# Patient Record
Sex: Male | Born: 1975 | Race: Black or African American | Hispanic: No | Marital: Single | Smoking: Never smoker
Health system: Southern US, Community
[De-identification: ages and names within clinical notes are randomized; demographics above are authoritative.]

## PROBLEM LIST (undated history)

## (undated) DIAGNOSIS — I509 Heart failure, unspecified: Secondary | ICD-10-CM

---

## 1998-08-09 ENCOUNTER — Emergency Department (HOSPITAL_COMMUNITY): Admission: EM | Admit: 1998-08-09 | Discharge: 1998-08-09 | Payer: Self-pay | Admitting: Emergency Medicine

## 2001-03-14 ENCOUNTER — Emergency Department (HOSPITAL_COMMUNITY): Admission: EM | Admit: 2001-03-14 | Discharge: 2001-03-14 | Payer: Self-pay | Admitting: Emergency Medicine

## 2001-03-14 ENCOUNTER — Encounter: Payer: Self-pay | Admitting: Emergency Medicine

## 2003-05-20 ENCOUNTER — Emergency Department (HOSPITAL_COMMUNITY): Admission: EM | Admit: 2003-05-20 | Discharge: 2003-05-21 | Payer: Self-pay | Admitting: Emergency Medicine

## 2003-05-21 ENCOUNTER — Encounter: Payer: Self-pay | Admitting: Emergency Medicine

## 2006-09-29 ENCOUNTER — Emergency Department (HOSPITAL_COMMUNITY): Admission: EM | Admit: 2006-09-29 | Discharge: 2006-09-30 | Payer: Self-pay | Admitting: Emergency Medicine

## 2016-06-17 ENCOUNTER — Emergency Department (HOSPITAL_COMMUNITY): Payer: Medicaid Other

## 2016-06-17 ENCOUNTER — Encounter (HOSPITAL_COMMUNITY): Payer: Self-pay | Admitting: Emergency Medicine

## 2016-06-17 ENCOUNTER — Inpatient Hospital Stay (HOSPITAL_COMMUNITY): Payer: Medicaid Other

## 2016-06-17 ENCOUNTER — Inpatient Hospital Stay (HOSPITAL_COMMUNITY)
Admission: EM | Admit: 2016-06-17 | Discharge: 2016-06-27 | DRG: 176 | Disposition: A | Discharge status: Home / Self Care | Payer: Medicaid Other | Attending: Internal Medicine | Admitting: Internal Medicine

## 2016-06-17 DIAGNOSIS — Z7901 Long term (current) use of anticoagulants: Secondary | ICD-10-CM | POA: Diagnosis not present

## 2016-06-17 DIAGNOSIS — I110 Hypertensive heart disease with heart failure: Secondary | ICD-10-CM | POA: Diagnosis present

## 2016-06-17 DIAGNOSIS — I420 Dilated cardiomyopathy: Secondary | ICD-10-CM | POA: Diagnosis present

## 2016-06-17 DIAGNOSIS — I493 Ventricular premature depolarization: Secondary | ICD-10-CM

## 2016-06-17 DIAGNOSIS — R7989 Other specified abnormal findings of blood chemistry: Secondary | ICD-10-CM | POA: Diagnosis present

## 2016-06-17 DIAGNOSIS — I5082 Biventricular heart failure: Secondary | ICD-10-CM | POA: Diagnosis present

## 2016-06-17 DIAGNOSIS — I959 Hypotension, unspecified: Secondary | ICD-10-CM | POA: Diagnosis not present

## 2016-06-17 DIAGNOSIS — I429 Cardiomyopathy, unspecified: Secondary | ICD-10-CM

## 2016-06-17 DIAGNOSIS — Z8701 Personal history of pneumonia (recurrent): Secondary | ICD-10-CM

## 2016-06-17 DIAGNOSIS — K5900 Constipation, unspecified: Secondary | ICD-10-CM | POA: Diagnosis present

## 2016-06-17 DIAGNOSIS — E875 Hyperkalemia: Secondary | ICD-10-CM | POA: Diagnosis present

## 2016-06-17 DIAGNOSIS — D6851 Activated protein C resistance: Secondary | ICD-10-CM | POA: Diagnosis present

## 2016-06-17 DIAGNOSIS — I2699 Other pulmonary embolism without acute cor pulmonale: Secondary | ICD-10-CM | POA: Diagnosis present

## 2016-06-17 DIAGNOSIS — I240 Acute coronary thrombosis not resulting in myocardial infarction: Secondary | ICD-10-CM

## 2016-06-17 DIAGNOSIS — I472 Ventricular tachycardia: Secondary | ICD-10-CM | POA: Diagnosis present

## 2016-06-17 DIAGNOSIS — K219 Gastro-esophageal reflux disease without esophagitis: Secondary | ICD-10-CM | POA: Diagnosis present

## 2016-06-17 DIAGNOSIS — R042 Hemoptysis: Secondary | ICD-10-CM | POA: Diagnosis present

## 2016-06-17 DIAGNOSIS — K761 Chronic passive congestion of liver: Secondary | ICD-10-CM | POA: Diagnosis present

## 2016-06-17 DIAGNOSIS — I824Z1 Acute embolism and thrombosis of unspecified deep veins of right distal lower extremity: Secondary | ICD-10-CM | POA: Diagnosis present

## 2016-06-17 DIAGNOSIS — R778 Other specified abnormalities of plasma proteins: Secondary | ICD-10-CM

## 2016-06-17 DIAGNOSIS — I248 Other forms of acute ischemic heart disease: Secondary | ICD-10-CM | POA: Diagnosis present

## 2016-06-17 DIAGNOSIS — R945 Abnormal results of liver function studies: Secondary | ICD-10-CM

## 2016-06-17 DIAGNOSIS — Z87891 Personal history of nicotine dependence: Secondary | ICD-10-CM | POA: Diagnosis not present

## 2016-06-17 DIAGNOSIS — I4729 Other ventricular tachycardia: Secondary | ICD-10-CM

## 2016-06-17 LAB — CBC
HCT: 46 % (ref 39.0–52.0)
Hemoglobin: 15.9 g/dL (ref 13.0–17.0)
MCH: 29.6 pg (ref 26.0–34.0)
MCHC: 34.6 g/dL (ref 30.0–36.0)
MCV: 85.5 fL (ref 78.0–100.0)
Platelets: 187 10*3/uL (ref 150–400)
RBC: 5.38 MIL/uL (ref 4.22–5.81)
RDW: 15.3 % (ref 11.5–15.5)
WBC: 8.5 10*3/uL (ref 4.0–10.5)

## 2016-06-17 LAB — ECHOCARDIOGRAM COMPLETE
Height: 79 in
Weight: 3360 oz

## 2016-06-17 LAB — D-DIMER, QUANTITATIVE (NOT AT ARMC): D DIMER QUANT: 15.83 ug{FEU}/mL — AB (ref 0.00–0.50)

## 2016-06-17 LAB — HEPATIC FUNCTION PANEL
ALK PHOS: 115 U/L (ref 38–126)
ALT: 228 U/L — AB (ref 17–63)
AST: 87 U/L — AB (ref 15–41)
Albumin: 3.1 g/dL — ABNORMAL LOW (ref 3.5–5.0)
Bilirubin, Direct: 0.7 mg/dL — ABNORMAL HIGH (ref 0.1–0.5)
Indirect Bilirubin: 2.1 mg/dL — ABNORMAL HIGH (ref 0.3–0.9)
TOTAL PROTEIN: 6.4 g/dL — AB (ref 6.5–8.1)
Total Bilirubin: 2.8 mg/dL — ABNORMAL HIGH (ref 0.3–1.2)

## 2016-06-17 LAB — BASIC METABOLIC PANEL
Anion gap: 9 (ref 5–15)
BUN: 13 mg/dL (ref 6–20)
CALCIUM: 8.2 mg/dL — AB (ref 8.9–10.3)
CO2: 20 mmol/L — AB (ref 22–32)
CREATININE: 0.95 mg/dL (ref 0.61–1.24)
Chloride: 105 mmol/L (ref 101–111)
Glucose, Bld: 179 mg/dL — ABNORMAL HIGH (ref 65–99)
Potassium: 4.3 mmol/L (ref 3.5–5.1)
SODIUM: 134 mmol/L — AB (ref 135–145)

## 2016-06-17 LAB — PROTIME-INR
INR: 1.79
Prothrombin Time: 21 seconds — ABNORMAL HIGH (ref 11.4–15.2)

## 2016-06-17 LAB — TROPONIN I: TROPONIN I: 0.04 ng/mL — AB (ref <0.03)

## 2016-06-17 LAB — MRSA PCR SCREENING: MRSA by PCR: NEGATIVE

## 2016-06-17 LAB — APTT: APTT: 37 s — AB (ref 24–36)

## 2016-06-17 LAB — I-STAT TROPONIN, ED: Troponin i, poc: 0.05 ng/mL (ref 0.00–0.08)

## 2016-06-17 LAB — HEPARIN LEVEL (UNFRACTIONATED)
HEPARIN UNFRACTIONATED: 0.33 [IU]/mL (ref 0.30–0.70)
Heparin Unfractionated: 0.22 IU/mL — ABNORMAL LOW (ref 0.30–0.70)

## 2016-06-17 LAB — ANTITHROMBIN III: ANTITHROMB III FUNC: 59 % — AB (ref 75–120)

## 2016-06-17 MED ORDER — SODIUM CHLORIDE 0.9% FLUSH
3.0000 mL | Freq: Two times a day (BID) | INTRAVENOUS | Status: DC
  Administered 2016-06-17 – 2016-06-27 (×15): 3 mL via INTRAVENOUS

## 2016-06-17 MED ORDER — MORPHINE SULFATE (PF) 2 MG/ML IV SOLN
1.0000 mg | INTRAVENOUS | Status: DC | PRN
  Administered 2016-06-17 – 2016-06-18 (×3): 2 mg via INTRAVENOUS
  Filled 2016-06-17 (×3): qty 1

## 2016-06-17 MED ORDER — IOPAMIDOL (ISOVUE-370) INJECTION 76%
100.0000 mL | Freq: Once | INTRAVENOUS | Status: AC | PRN
  Administered 2016-06-17: 100 mL via INTRAVENOUS

## 2016-06-17 MED ORDER — IOPAMIDOL (ISOVUE-370) INJECTION 76%
INTRAVENOUS | Status: AC
  Filled 2016-06-17: qty 100

## 2016-06-17 MED ORDER — HEPARIN (PORCINE) IN NACL 100-0.45 UNIT/ML-% IJ SOLN
2350.0000 [IU]/h | INTRAMUSCULAR | Status: DC
  Administered 2016-06-17 – 2016-06-18 (×3): 1900 [IU]/h via INTRAVENOUS
  Administered 2016-06-19: 2350 [IU]/h via INTRAVENOUS
  Administered 2016-06-19: 2200 [IU]/h via INTRAVENOUS
  Administered 2016-06-20 – 2016-06-27 (×15): 2350 [IU]/h via INTRAVENOUS
  Filled 2016-06-17 (×21): qty 250

## 2016-06-17 MED ORDER — ONDANSETRON HCL 4 MG/2ML IJ SOLN
4.0000 mg | Freq: Once | INTRAMUSCULAR | Status: AC
  Administered 2016-06-17: 4 mg via INTRAVENOUS
  Filled 2016-06-17: qty 2

## 2016-06-17 MED ORDER — HYDROMORPHONE HCL 1 MG/ML IJ SOLN
1.0000 mg | Freq: Once | INTRAMUSCULAR | Status: AC
  Administered 2016-06-17: 1 mg via INTRAVENOUS
  Filled 2016-06-17: qty 1

## 2016-06-17 MED ORDER — HEPARIN (PORCINE) IN NACL 100-0.45 UNIT/ML-% IJ SOLN
1500.0000 [IU]/h | INTRAMUSCULAR | Status: DC
  Administered 2016-06-17: 1500 [IU]/h via INTRAVENOUS
  Filled 2016-06-17 (×2): qty 250

## 2016-06-17 NOTE — H&P (Signed)
History and Physical    Richard [name] FOY:[id number] DOB: [birth year] DOA: 06/17/2016   PCP: No primary care provider on file. Chief Complaint:  Chief Complaint  Patient presents with  . Shortness of Breath  . Chest Pain    HPI: Richard [name] is a 40 y.o. male with medical history significant of previously healthy.  Patient presents to the ED with c/o SOB and chest pain.  Symptoms have been ongoing for about the past 1 month.  Became significantly worse yesterday and last night he felt like he couldn't breath.  This prompted him to come in to the ED.  Nothing makes symptoms better or worse, has tried nothing for symptoms.  Patient has no family history of clotting disorders, did travel to [place] about 1 month ago.  No other recent travel or prolonged sitting.  ED Course: Multifocal PEs, RV strain, pulmonary infarcts.  Review of Systems: As per HPI otherwise 10 point review of systems negative.    History reviewed. No pertinent past medical history.  History reviewed. No pertinent surgical history.   reports that he has never smoked. He has never used smokeless tobacco. He reports that he does not drink alcohol or use drugs.  No Known Allergies  History reviewed. No pertinent family history.   Prior to Admission medications   Not on File    Physical Exam: Vitals:   06/17/16 0454 06/17/16 0500 06/17/16 0530 06/17/16 0600  BP:  108/88 117/85 90/69  Pulse:  102 (!) 48 (!) 33  Resp:   23 24  Temp:      TempSrc:      SpO2: (S) 94% 100% 97% 98%  Weight:      Height:          Constitutional: NAD, calm, comfortable Eyes: PERRL, lids and conjunctivae normal ENMT: Mucous membranes are moist. Posterior pharynx clear of any exudate or lesions.Normal dentition.  Neck: normal, supple, no masses, no thyromegaly Respiratory: clear to auscultation bilaterally, no wheezing, no crackles. Normal respiratory effort. No accessory muscle use.  Cardiovascular: Regular rate and  rhythm, no murmurs / rubs / gallops. No extremity edema. 2+ pedal pulses. No carotid bruits.  Abdomen: no tenderness, no masses palpated. No hepatosplenomegaly. Bowel sounds positive.  Musculoskeletal: no clubbing / cyanosis. No joint deformity upper and lower extremities. Good ROM, no contractures. Normal muscle tone.  Skin: no rashes, lesions, ulcers. No induration Neurologic: CN 2-12 grossly intact. Sensation intact, DTR normal. Strength 5/5 in all 4.  Psychiatric: Normal judgment and insight. Alert and oriented x 3. Normal mood.    Labs on Admission: I have personally reviewed following labs and imaging studies  CBC:  Recent Labs Lab 06/17/16 0401  WBC 8.5  HGB 15.9  HCT 46.0  MCV 85.5  PLT 187   Basic Metabolic Panel:  Recent Labs Lab 06/17/16 0401  NA 134*  K 4.3  CL 105  CO2 20*  GLUCOSE 179*  BUN 13  CREATININE 0.95  CALCIUM 8.2*   GFR: Estimated Creatinine Clearance: 138.4 mL/min (by C-G formula based on SCr of 0.95 mg/dL). Liver Function Tests: No results for input(s): AST, ALT, ALKPHOS, BILITOT, PROT, ALBUMIN in the last 168 hours. No results for input(s): LIPASE, AMYLASE in the last 168 hours. No results for input(s): AMMONIA in the last 168 hours. Coagulation Profile:  Recent Labs Lab 06/17/16 0546  INR 1.79   Cardiac Enzymes: No results for input(s): CKTOTAL, CKMB, CKMBINDEX, TROPONINI in the last 168 hours. BNP (last 3 results) No  results for input(s): PROBNP in the last 8760 hours. HbA1C: No results for input(s): HGBA1C in the last 72 hours. CBG: No results for input(s): GLUCAP in the last 168 hours. Lipid Profile: No results for input(s): CHOL, HDL, LDLCALC, TRIG, CHOLHDL, LDLDIRECT in the last 72 hours. Thyroid Function Tests: No results for input(s): TSH, T4TOTAL, FREET4, T3FREE, THYROIDAB in the last 72 hours. Anemia Panel: No results for input(s): VITAMINB12, FOLATE, FERRITIN, TIBC, IRON, RETICCTPCT in the last 72 hours. Urine  analysis: No results found for: COLORURINE, APPEARANCEUR, LABSPEC, PHURINE, GLUCOSEU, HGBUR, BILIRUBINUR, KETONESUR, PROTEINUR, UROBILINOGEN, NITRITE, LEUKOCYTESUR Sepsis Labs: @LABRCNTIP (procalcitonin:4,lacticidven:4) )No results found for this or any previous visit (from the past 240 hour(s)).   Radiological Exams on Admission: Dg Chest 2 View  Result Date: 06/17/2016 CLINICAL DATA:  Chest pain, hemoptysis and vomiting EXAM: CHEST  2 VIEW COMPARISON:  Chest radiograph 05/13/2016 FINDINGS: Massive enlargement of the cardiomediastinal silhouette is unchanged. No pleural effusion or pneumothorax. There is right middle lobe atelectasis. The left lung is clear. No pulmonary edema. IMPRESSION: 1. Right middle lobe collapse. Superimposed infection would be difficult to exclude. 2. Unchanged massive cardiomegaly versus pericardial effusion. Electronically Signed   By: Deatra Robinson M.D.   On: 06/17/2016 04:20   Ct Angio Chest Pe W And/or Wo Contrast  Result Date: 06/17/2016 CLINICAL DATA:  Right chest pain radiating to the back. Pneumonia last month. Symptoms have not responded to antibiotics. Blood in sputum. Shortness of breath, tachycardia, hypoxia. EXAM: CT ANGIOGRAPHY CHEST WITH CONTRAST TECHNIQUE: Multidetector CT imaging of the chest was performed using the standard protocol during bolus administration of intravenous contrast. Multiplanar CT image reconstructions and MIPs were obtained to evaluate the vascular anatomy. CONTRAST:  100 mL Isovue 370 COMPARISON:  None. FINDINGS: Cardiovascular: Filling defects in the right lower lobe and left lingular pulmonary arteries consistent with pulmonary emboli. Cardiac enlargement. RV to LV ratio is upper limits of normal at 0.88. There is reflux of contrast material into the hepatic veins. Changes may indicate evidence of right heart strain. No aortic aneurysm. No pericardial effusion. Mediastinum/Nodes: Enlarged lymph nodes demonstrated throughout the  mediastinum. Largest node is in the aortopulmonic window, measuring 15 mm short axis dimension. Nodes are likely reactive. Esophagus is decompressed. Lungs/Pleura: Focal areas of airspace disease demonstrated in the left lingula, left lower lung, and right lower lung. These changes may represent multifocal pneumonia or infarcts. Small left pleural effusion. No pneumothorax. Upper Abdomen: No acute abnormality. Musculoskeletal: Degenerative changes in the spine. No destructive bone lesions. Review of the MIP images confirms the above findings. IMPRESSION: Positive for multiple bilateral pulmonary emboli involving segmental and subsegmental branches. Borderline RV to LV ratio of 0.88 and reflux of contrast material into the hepatic veins suggests possibility of right heart strain. Focal areas of airspace disease in the right lower lobe and left upper lung may indicate pulmonary infarcts or multifocal pneumonia. Small left pleural effusion. These results were called by telephone at the time of interpretation on 06/17/2016 at 5:28 am to PA [facility] , who verbally acknowledged these results. Electronically Signed   By: Burman Nieves M.D.   On: 06/17/2016 05:35    EKG: Independently reviewed.  Assessment/Plan Active Problems:   Pulmonary embolism and infarction (HCC)    1. Pulmonary embolism and infarction with RV strain - 1. Heparin gtt 2. Tele monitor 3. 2d echo 4. hypercoag work up ordered and was drawn right before he was put on heparin gtt 2. Elevated INR on initial labs -  1. Unclear why INR 1.79 2. LFTs ordered   DVT prophylaxis: Heparin gtt Code Status: Full Family Communication: Wife at bedside Consults called: None Admission status: Admit to inpatient   Hillary BowGARDNER, Paidyn Mcferran M. DO [facility] Pager [phone] from 7PM-7AM  If 7AM-7PM, please contact the day physician for the patient [email or web address] Password [email or web address]  06/17/2016, 6:21 AM

## 2016-06-17 NOTE — Progress Notes (Signed)
ANTICOAGULATION CONSULT NOTE - Initial Consult  Pharmacy Consult for Heparin Indication: Multiple bilateral Pulmonary Embolism with possible right heart strain  No Known Allergies  Patient Measurements: Height: 6\' 7"  (200.7 cm) Weight: 210 lb (95.3 kg) IBW/kg (Calculated) : 93.7 Heparin Dosing Weight: actual body weight  Vital Signs: Temp: 97.3 F (36.3 C) (11/20 1200) Temp Source: Oral (11/20 1200) BP: 118/94 (11/20 1200) Pulse Rate: 96 (11/20 1200)  Labs:  Recent Labs  06/17/16 0401 06/17/16 0546 06/17/16 1223  HGB 15.9  --   --   HCT 46.0  --   --   PLT 187  --   --   APTT  --  37*  --   LABPROT  --  21.0*  --   INR  --  1.79  --   HEPARINUNFRC  --   --  0.22*  CREATININE 0.95  --   --     Estimated Creatinine Clearance: 138.4 mL/min (by C-G formula based on SCr of 0.95 mg/dL).   Medical History: History reviewed. No pertinent past medical history.  Medications:  No meds PTA  Assessment: 40 yr male with no significant PMH presents with complaint of shortness of breath and chest pain.  Recently completed course of Zpack with no resolution of symptoms.  Reports hemoptysis which has been worsening. Pharmacy to dose heparin for PE  Significant events:  11/19: CTAngio shows bilateral pulmonary embolism with possible right heart strain  Today, 06/17/2016:  CBC: wnl  Most recent heparin level SUBtherapeutic on 1500 units/hr  Patient endorses hemoptysis for ~ 1 month, but this has not worsened since initiating heparin  CrCl: > 90 ml/min  Goal of Therapy: Heparin level 0.3-0.7 units/ml Monitor platelets by anticoagulation protocol: Yes  Plan:  Increase heparin IV infusion to 1900 units/hr; may want to stay on lower end of range given hemoptysis  Check heparin level 6 hrs after rate change  Daily CBC, daily heparin level once stable  Monitor for signs of bleeding or thrombosis   Bernadene Person, PharmD Pager: [phone] 06/17/2016, 1:27  PM

## 2016-06-17 NOTE — Progress Notes (Signed)
PHARMACIST - PHYSICIAN COMMUNICATION CONCERNING:  IV heparin  40 yoM on IV heparin for bilateral PEs with possible right heart strain.  Please see note written by Bernadene Person, PharmD, earlier today for more details.    Heparin infusion currently at 1900 units/hr.  Heparin level tonight = 0.33, therapeutic but at low end of range.  Pt continues to have hemoptysis, no better or worse per patient than what he was experiencing at home.  No issues with infusion.   RECOMMENDATION: Continue heparin infusion at current rate and recheck in 6 hours.   Haynes Hoehn, PharmD, BCPS 06/17/2016, 8:50 PM  Pager: [phone]

## 2016-06-17 NOTE — Progress Notes (Signed)
Kirtland Bouchard Schorr paged about critical troponin 0.04 at 2135.

## 2016-06-17 NOTE — Progress Notes (Signed)
ANTICOAGULATION CONSULT NOTE - Initial Consult  Pharmacy Consult for Heparin Indication: Multiple bilateral Pulmonary Embolism with possible right heart strain  No Known Allergies  Patient Measurements: Height: 6\' 7"  (200.7 cm) Weight: 210 lb (95.3 kg) IBW/kg (Calculated) : 93.7 Heparin Dosing Weight: actual body weight  Vital Signs: Temp: 98.3 F (36.8 C) (11/20 0347) Temp Source: Oral (11/20 0347) BP: 117/85 (11/20 0530) Pulse Rate: 48 (11/20 0530)  Labs:  Recent Labs  06/17/16 0401  HGB 15.9  HCT 46.0  PLT 187  CREATININE 0.95    Estimated Creatinine Clearance: 138.4 mL/min (by C-G formula based on SCr of 0.95 mg/dL).   Medical History: History reviewed. No pertinent past medical history.  Medications:  No meds PTA  Assessment: 40 yr male with no significant PMH presents with complaint of shortness of breath and chest pain.  Recently completed course of Zpack with no resolution of symptoms.  Reports hemoptysis which has been worsening   CTAngio shows bilateral pulmonary embolism with possible right heart strain  Pharmacy consulted to dose IV heparin  Goal of Therapy:  Heparin level 0.3-0.7 units/ml Monitor platelets by anticoagulation protocol: Yes   Plan:   Obtain baseline aPTT and PT/INR per protocol  No heparin bolus due to hemoptysis  Begin IV heparin @ 1500 units/hr  Check heparin level 6 hr after heparin started  Follow heparin level & CBC daily   Jevante Hollibaugh, Joselyn Glassman, PharmD 06/17/2016,5:44 AM

## 2016-06-17 NOTE — ED Triage Notes (Signed)
Reports having right sided chest pain that started yesterday afternoon around 1500 with radiation into back. Pt also reports being dx with pneumonia last month and feels like it has not cleared up even after being on abx. Pt reported that he has had times of coughing up blood.

## 2016-06-17 NOTE — Care Management Note (Signed)
Case Management Note  Patient Details  Name: Richard [name] MRN: [id number] Date of Birth: 1975-09-15  Subjective/Objective:         Multiple pulmonary emboli           Action/Plan:  Lives at home Date:  June 17, 2016 Chart reviewed for concurrent status and case management needs. Will continue to follow patient progress. Discharge Planning: following for needs Expected discharge date: [id number] Marcelle Smiling, BSN, [place], [place]   [phone]   Expected Discharge Date:                  Expected Discharge Plan:  Home/Self Care  In-House Referral:     Discharge planning Services     Post Acute Care Choice:    Choice offered to:     DME Arranged:    DME Agency:     HH Arranged:    HH Agency:     Status of Service:  In process, will continue to follow  If discussed at Long Length of Stay Meetings, dates discussed:    Additional Comments:  Golda Acre, RN 06/17/2016, 8:48 AM

## 2016-06-17 NOTE — Progress Notes (Signed)
Hoss Palermo is a 40 y.o. male with no significant medical history, presents to the ED with c/o SOB and chest pain. He was found to have multifocal pulmonary embolism, RV strain and pulmonary infarcts. He was started on IV heparin and admitted to step down.  Hypercoagulable work up ordered and 2 D echo ordered.  It showed Large thrombus burding in LV apex and distal  septum The cavity size was severely dilated. Wall thickness was  normal. The estimated ejection fraction was 15% Diffuse hypokinesis. Doppler parameters are consistent with both elevated  ventricular end-diastolic filling pressure and elevated left atrial filling pressure. Cardiology consulted for recommendations.  Continue to monitor in step down.   Edson Snowball Kyshon Tolliver,MD [phone]

## 2016-06-17 NOTE — ED Provider Notes (Signed)
[facility]-EMERGENCY DEPT Provider Note   CSN: [id number] Arrival date & time: 06/17/16  0340    History   Chief Complaint Chief Complaint  Patient presents with  . Shortness of Breath  . Chest Pain    HPI Richard [name] is a 40 y.o. male.  40 year old male with no known past medical history presents to the emergency department for shortness of breath and right-sided chest pain. Patient states that his chest pain is worse with breathing. Patient notes significant worsening of this pain yesterday afternoon at 1500. Pain is sharp and will radiate to the back. He has had shortness of breath which is worse with exertion. Patient reporting hemoptysis which has been intermittent and worsening over the past few weeks. Patient was initially seen one month ago at urgent care and diagnosed with an upper respiratory infection. He was placed on a Z-Pak which he completed without significant resolution of his symptoms. Patient went [facility] on 05/17/2016 for persistent cough. He was discharged on doxycycline. Patient has completed this course, but has not noted any improvement to his cough or breathing difficulty. Patient denies any recent surgeries or hospitalizations. No history of blood clots. He has had no leg swelling. He is self-employed and denies frequent travel or long-haul driving. No recent fevers. Patient reports a history of smoking, but quit approximately 15 years ago.   The history is provided by the patient and the spouse. No language interpreter was used.  Shortness of Breath  Associated symptoms include cough, chest pain (right lower chest wall) and vomiting. Pertinent negatives include no fever.  Chest Pain   Associated symptoms include cough, nausea, shortness of breath and vomiting. Pertinent negatives include no fever.    History reviewed. No pertinent past medical history.  Patient Active Problem List   Diagnosis Date Noted  . Pulmonary embolism and infarction (HCC)  06/17/2016    History reviewed. No pertinent surgical history.     Home Medications    Prior to Admission medications   Not on File    Family History History reviewed. No pertinent family history.  Social History Social History  Substance Use Topics  . Smoking status: Never Smoker  . Smokeless tobacco: Never Used  . Alcohol use No     Allergies   Patient has no known allergies.   Review of Systems Review of Systems  Constitutional: Negative for fever.  Respiratory: Positive for cough, chest tightness and shortness of breath.        +hemoptysis +DOE  Cardiovascular: Positive for chest pain (right lower chest wall).  Gastrointestinal: Positive for nausea and vomiting.  Neurological: Negative for syncope.  Ten systems reviewed and are negative for acute change, except as noted in the HPI.    Physical Exam Updated Vital Signs BP 117/85   Pulse (!) 48   Temp 98.3 F (36.8 C) (Oral)   Resp 23   Ht 6\' 7"  (2.007 m)   Wt 95.3 kg   SpO2 97%   BMI 23.66 kg/m   Physical Exam  Constitutional: He is oriented to person, place, and time. He appears well-developed and well-nourished. No distress.  Patient visibly tachypneic, but calm.   HENT:  Head: Normocephalic and atraumatic.  Eyes: Conjunctivae and EOM are normal. No scleral icterus.  Neck: Normal range of motion.  Cardiovascular: Regular rhythm and intact distal pulses.   Tachycardia to 115bpm  Pulmonary/Chest: He is in respiratory distress (mild). He has no wheezes. He has no rales.  Tachypnea with dyspnea.  Decreased lung sounds in bilateral lower lobes. Clear lung sounds in upper lobes. No wheezing or rales. Gross hemoptysis noted. No rales or rhonchi.  Abdominal: Soft. He exhibits no distension. There is no tenderness. There is no guarding.  Soft, nontender abdomen  Musculoskeletal: Normal range of motion.  No BLE pitting edema.  Neurological: He is alert and oriented to person, place, and time. He  exhibits normal muscle tone. Coordination normal.  Skin: Skin is warm and dry. No rash noted. He is not diaphoretic. No erythema. No pallor.  Psychiatric: He has a normal mood and affect. His behavior is normal.  Nursing note and vitals reviewed.    ED Treatments / Results  Labs (all labs ordered are listed, but only abnormal results are displayed) Labs Reviewed  BASIC METABOLIC PANEL - Abnormal; Notable for the following:       Result Value   Sodium 134 (*)    CO2 20 (*)    Glucose, Bld 179 (*)    Calcium 8.2 (*)    All other components within normal limits  D-DIMER, QUANTITATIVE (NOT AT [facility]) - Abnormal; Notable for the following:    D-Dimer, Quant 15.83 (*)    All other components within normal limits  CBC  PROTIME-INR  APTT  ANTITHROMBIN III  PROTEIN C ACTIVITY  PROTEIN C, TOTAL  PROTEIN S ACTIVITY  PROTEIN S, TOTAL  LUPUS ANTICOAGULANT PANEL  BETA-2-GLYCOPROTEIN I ABS, IGG/M/A  HOMOCYSTEINE  FACTOR 5 LEIDEN  PROTHROMBIN GENE MUTATION  CARDIOLIPIN ANTIBODIES, IGG, IGM, IGA  HEPARIN LEVEL (UNFRACTIONATED)  I-STAT TROPOININ, ED    EKG  EKG Interpretation  Date/Time:  Monday June 17 2016 04:25:21 EST Ventricular Rate:  114 PR Interval:    QRS Duration: 89 QT Interval:  344 QTC Calculation: 474 R Axis:   100 Text Interpretation:  Sinus tachycardia Probable left atrial enlargement Low voltage with right axis deviation Confirmed by [facility]AS[facility]  MD, APRIL ([id number]) on 06/17/2016 5:18:01 AM       Radiology Dg Chest 2 View  Result Date: 06/17/2016 CLINICAL DATA:  Chest pain, hemoptysis and vomiting EXAM: CHEST  2 VIEW COMPARISON:  Chest radiograph 05/13/2016 FINDINGS: Massive enlargement of the cardiomediastinal silhouette is unchanged. No pleural effusion or pneumothorax. There is right middle lobe atelectasis. The left lung is clear. No pulmonary edema. IMPRESSION: 1. Right middle lobe collapse. Superimposed infection would be difficult to exclude. 2.  Unchanged massive cardiomegaly versus pericardial effusion. Electronically Signed   By: Deatra RobinsonKevin  Herman M.D.   On: 06/17/2016 04:20   Ct Angio Chest Pe W And/or Wo Contrast  Result Date: 06/17/2016 CLINICAL DATA:  Right chest pain radiating to the back. Pneumonia last month. Symptoms have not responded to antibiotics. Blood in sputum. Shortness of breath, tachycardia, hypoxia. EXAM: CT ANGIOGRAPHY CHEST WITH CONTRAST TECHNIQUE: Multidetector CT imaging of the chest was performed using the standard protocol during bolus administration of intravenous contrast. Multiplanar CT image reconstructions and MIPs were obtained to evaluate the vascular anatomy. CONTRAST:  100 mL Isovue 370 COMPARISON:  None. FINDINGS: Cardiovascular: Filling defects in the right lower lobe and left lingular pulmonary arteries consistent with pulmonary emboli. Cardiac enlargement. RV to LV ratio is upper limits of normal at 0.88. There is reflux of contrast material into the hepatic veins. Changes may indicate evidence of right heart strain. No aortic aneurysm. No pericardial effusion. Mediastinum/Nodes: Enlarged lymph nodes demonstrated throughout the mediastinum. Largest node is in the aortopulmonic window, measuring 15 mm short axis dimension. Nodes are likely reactive.  Esophagus is decompressed. Lungs/Pleura: Focal areas of airspace disease demonstrated in the left lingula, left lower lung, and right lower lung. These changes may represent multifocal pneumonia or infarcts. Small left pleural effusion. No pneumothorax. Upper Abdomen: No acute abnormality. Musculoskeletal: Degenerative changes in the spine. No destructive bone lesions. Review of the MIP images confirms the above findings. IMPRESSION: Positive for multiple bilateral pulmonary emboli involving segmental and subsegmental branches. Borderline RV to LV ratio of 0.88 and reflux of contrast material into the hepatic veins suggests possibility of right heart strain. Focal areas of  airspace disease in the right lower lobe and left upper lung may indicate pulmonary infarcts or multifocal pneumonia. Small left pleural effusion. These results were called by telephone at the time of interpretation on 06/17/2016 at 5:28 am to PA [facility] , who verbally acknowledged these results. Electronically Signed   By: Burman Nieves M.D.   On: 06/17/2016 05:35    Procedures Procedures (including critical care time)  Medications Ordered in ED Medications  heparin ADULT infusion 100 units/mL (25000 units/260mL sodium chloride 0.45%) (1,500 Units/hr Intravenous New Bag/Given 06/17/16 0558)  HYDROmorphone (DILAUDID) injection 1 mg (1 mg Intravenous Given 06/17/16 0439)  ondansetron (ZOFRAN) injection 4 mg (4 mg Intravenous Given 06/17/16 0439)  iopamidol (ISOVUE-370) 76 % injection 100 mL (100 mLs Intravenous Contrast Given 06/17/16 0507)    CRITICAL CARE Performed by: Antony Madura   Total critical care time: 45 minutes  Critical care time was exclusive of separately billable procedures and treating other patients.  Critical care was necessary to treat or prevent imminent or life-threatening deterioration.  Critical care was time spent personally by me on the following activities: development of treatment plan with patient and/or surrogate as well as nursing, discussions with consultants, evaluation of patient's response to treatment, examination of patient, obtaining history from patient or surrogate, ordering and performing treatments and interventions, ordering and review of laboratory studies, ordering and review of radiographic studies, pulse oximetry and re-evaluation of patient's condition.   Initial Impression / Assessment and Plan / ED Course  I have reviewed the triage vital signs and the nursing notes.  Pertinent labs & imaging results that were available during my care of the patient were reviewed by me and considered in my medical decision making (see chart for  details).   Clinical Course     Patient with bilateral pulmonary emboli in the proximal segmental branches with associated pulmonary infarct. Most recent SpO2 97% on 4L via [place]. Heart rate 96bpm. Patient started on heparin gtt; coagulable panel obtained prior to heparinization. Patient has been visualized in the emergency department by critical care who believe he is stable for admission to stepdown. Case discussed with Dr. Julian Reil of [facility] who will make oncoming team aware of patient's admission.   Final Clinical Impressions(s) / ED Diagnoses   Final diagnoses:  Pulmonary embolism with infarction [facility])    New Prescriptions New Prescriptions   No medications on file     Antony Madura, PA-C 06/17/16 [id number]    April Palumbo, MD 06/17/16 [phone]

## 2016-06-17 NOTE — Progress Notes (Signed)
[facility] PCCM  Informed of admission.  Has PE's, RV strain on CT but hemodynamically stable. Briefly seen by my partner.  He was noted to be resting comfortably on 4L [place].  Recommend admission to ICU, careful use of heparin IV in setting of pulmonary infarct.  PCCM happy to see in formal consultation if felt appropriate by primary service.    Heber Honcut, MD [place] P[facility]M Pager: [phone] Cell: [phone] After 3pm or if no response, call [phone]

## 2016-06-17 NOTE — Progress Notes (Signed)
Attending MD made aware of pt's ECHO results.

## 2016-06-17 NOTE — Progress Notes (Signed)
  Echocardiogram 2D Echocardiogram has been performed.  Richard [name] 06/17/2016, 1:52 PM

## 2016-06-18 ENCOUNTER — Encounter (HOSPITAL_COMMUNITY): Payer: Self-pay | Admitting: Physician Assistant

## 2016-06-18 DIAGNOSIS — B3324 Viral cardiomyopathy: Secondary | ICD-10-CM

## 2016-06-18 DIAGNOSIS — R945 Abnormal results of liver function studies: Secondary | ICD-10-CM

## 2016-06-18 DIAGNOSIS — R042 Hemoptysis: Secondary | ICD-10-CM

## 2016-06-18 DIAGNOSIS — R778 Other specified abnormalities of plasma proteins: Secondary | ICD-10-CM

## 2016-06-18 DIAGNOSIS — R7989 Other specified abnormal findings of blood chemistry: Secondary | ICD-10-CM

## 2016-06-18 DIAGNOSIS — I472 Ventricular tachycardia: Secondary | ICD-10-CM

## 2016-06-18 DIAGNOSIS — I240 Acute coronary thrombosis not resulting in myocardial infarction: Secondary | ICD-10-CM

## 2016-06-18 DIAGNOSIS — I213 ST elevation (STEMI) myocardial infarction of unspecified site: Secondary | ICD-10-CM

## 2016-06-18 DIAGNOSIS — I493 Ventricular premature depolarization: Secondary | ICD-10-CM

## 2016-06-18 DIAGNOSIS — I5082 Biventricular heart failure: Secondary | ICD-10-CM

## 2016-06-18 DIAGNOSIS — I429 Cardiomyopathy, unspecified: Secondary | ICD-10-CM

## 2016-06-18 DIAGNOSIS — I2699 Other pulmonary embolism without acute cor pulmonale: Principal | ICD-10-CM

## 2016-06-18 DIAGNOSIS — R748 Abnormal levels of other serum enzymes: Secondary | ICD-10-CM

## 2016-06-18 DIAGNOSIS — I4729 Other ventricular tachycardia: Secondary | ICD-10-CM

## 2016-06-18 LAB — CBC
HCT: 43 % (ref 39.0–52.0)
Hemoglobin: 14.4 g/dL (ref 13.0–17.0)
MCH: 29.1 pg (ref 26.0–34.0)
MCHC: 33.5 g/dL (ref 30.0–36.0)
MCV: 87 fL (ref 78.0–100.0)
PLATELETS: 176 10*3/uL (ref 150–400)
RBC: 4.94 MIL/uL (ref 4.22–5.81)
RDW: 15.5 % (ref 11.5–15.5)
WBC: 7.5 10*3/uL (ref 4.0–10.5)

## 2016-06-18 LAB — LUPUS ANTICOAGULANT PANEL
DRVVT: 46.8 s (ref 0.0–47.0)
PTT Lupus Anticoagulant: 44.6 s (ref 0.0–51.9)

## 2016-06-18 LAB — BETA-2-GLYCOPROTEIN I ABS, IGG/M/A: Beta-2-Glycoprotein I IgM: <9 GPI IgM units (ref 0–32)

## 2016-06-18 LAB — COMPREHENSIVE METABOLIC PANEL
ALBUMIN: 2.6 g/dL — AB (ref 3.5–5.0)
ALT: 24 U/L (ref 17–63)
AST: 28 U/L (ref 15–41)
Alkaline Phosphatase: 53 U/L (ref 38–126)
Anion gap: 6 (ref 5–15)
BILIRUBIN TOTAL: 1.5 mg/dL — AB (ref 0.3–1.2)
BUN: 14 mg/dL (ref 6–20)
CHLORIDE: 104 mmol/L (ref 101–111)
CO2: 25 mmol/L (ref 22–32)
Calcium: 8.2 mg/dL — ABNORMAL LOW (ref 8.9–10.3)
Creatinine, Ser: 0.94 mg/dL (ref 0.61–1.24)
GFR calc Af Amer: >60 mL/min (ref >60)
GFR calc non Af Amer: >60 mL/min (ref >60)
GLUCOSE: 136 mg/dL — AB (ref 65–99)
POTASSIUM: 4.9 mmol/L (ref 3.5–5.1)
Sodium: 135 mmol/L (ref 135–145)
Total Protein: 6.5 g/dL (ref 6.5–8.1)

## 2016-06-18 LAB — CARDIOLIPIN ANTIBODIES, IGG, IGM, IGA

## 2016-06-18 LAB — HEPARIN LEVEL (UNFRACTIONATED)
HEPARIN UNFRACTIONATED: 0.35 [IU]/mL (ref 0.30–0.70)
Heparin Unfractionated: 0.33 IU/mL (ref 0.30–0.70)

## 2016-06-18 LAB — HOMOCYSTEINE: HOMOCYSTEINE-NORM: 9.7 umol/L (ref 0.0–15.0)

## 2016-06-18 LAB — TROPONIN I
Troponin I: 0.04 ng/mL (ref <0.03)
Troponin I: 0.04 ng/mL (ref <0.03)

## 2016-06-18 LAB — MAGNESIUM: Magnesium: 2 mg/dL (ref 1.7–2.4)

## 2016-06-18 LAB — PROTEIN S ACTIVITY: Protein S Activity: 58 % — ABNORMAL LOW (ref 63–140)

## 2016-06-18 LAB — PROTEIN S, TOTAL: PROTEIN S AG TOTAL: 77 % (ref 60–150)

## 2016-06-18 LAB — PROTEIN C ACTIVITY: Protein C Activity: 37 % — ABNORMAL LOW (ref 73–180)

## 2016-06-18 LAB — PROTEIN C, TOTAL: Protein C, Total: 36 % — ABNORMAL LOW (ref 60–150)

## 2016-06-18 MED ORDER — HYDROMORPHONE HCL 1 MG/ML IJ SOLN
1.0000 mg | INTRAMUSCULAR | Status: DC | PRN
  Administered 2016-06-18 – 2016-06-23 (×8): 1 mg via INTRAVENOUS
  Filled 2016-06-18: qty 1
  Filled 2016-06-18: qty 2
  Filled 2016-06-18 (×6): qty 1

## 2016-06-18 MED ORDER — HYDROMORPHONE HCL 2 MG/ML IJ SOLN
2.0000 mg | INTRAMUSCULAR | Status: DC | PRN
  Administered 2016-06-18: 2 mg via INTRAVENOUS
  Filled 2016-06-18: qty 1

## 2016-06-18 NOTE — Progress Notes (Signed)
ANTICOAGULATION CONSULT NOTE - Initial Consult  Pharmacy Consult for Heparin Indication: Multiple bilateral Pulmonary Embolism with possible right heart strain  No Known Allergies  Patient Measurements: Height: 6\' 7"  (200.7 cm) Weight: 210 lb (95.3 kg) IBW/kg (Calculated) : 93.7 Heparin Dosing Weight: actual body weight  Vital Signs: Temp: 98.3 F (36.8 C) (11/21 0750) Temp Source: Oral (11/21 0750) BP: 128/96 (11/21 1030) Pulse Rate: 108 (11/21 1030)  Labs:  Recent Labs  06/17/16 0401 06/17/16 0546  06/17/16 2006 06/17/16 2031 06/18/16 0150 06/18/16 0858  HGB 15.9  --   --   --   --  14.4  --   HCT 46.0  --   --   --   --  43.0  --   PLT 187  --   --   --   --  176  --   APTT  --  37*  --   --   --   --   --   LABPROT  --  21.0*  --   --   --   --   --   INR  --  1.79  --   --   --   --   --   HEPARINUNFRC  --   --   < > 0.33  --  0.33 0.35  CREATININE 0.95  --   --   --   --   --  0.94  TROPONINI  --   --   --   --  0.04* 0.04* 0.04*  < > = values in this interval not displayed.  Estimated Creatinine Clearance: 139.8 mL/min (by C-G formula based on SCr of 0.94 mg/dL).   Medical History: History reviewed. No pertinent past medical history.  Medications:  No meds PTA  Assessment: 40 yr male with no significant PMH presents with complaint of shortness of breath and chest pain.  Recently completed course of Zpack with no resolution of symptoms.  Reports hemoptysis which has been worsening. Pharmacy to dose heparin for PE  Significant events:  11/19: CTAngio shows bilateral pulmonary embolism with possible right heart strain  Today, 06/18/2016:  CBC: wnl, stable  Heparin level therapeutic x 3 on 1900 units/hr  Patient endorses hemoptysis for ~ 1 month, but this has not worsened since initiating heparin  CrCl: > 90 ml/min  Goal of Therapy: Heparin level 0.3-0.7 units/ml Monitor platelets by anticoagulation protocol: Yes  Plan:  Continue heparin IV  infusion to 1900 units/hr; may want to stay on lower end of range given hemoptysis  Check heparin level 6 hrs after rate change  Daily CBC, HL  Monitor closely for worsening hemoptysis   Bernadene Person, PharmD Pager: [phone] 06/18/2016, 10:59 AM

## 2016-06-18 NOTE — Progress Notes (Signed)
PHARMACIST - PHYSICIAN COMMUNICATION CONCERNING:  IV heparin  40 yoM on IV heparin for bilateral PEs with possible right heart strain.  Please see note written by Bernadene Person, PharmD, 11/20 for more details.    Heparin infusion currently at 1900 units/hr.  Heparin level tonight = 0.33 x2 , therapeutic but at low end of range.  Pt continues to have hemoptysis, no better or worse per patient than what he was experiencing at home.  No issues with infusion.   RECOMMENDATION: Continue heparin infusion at current rate and recheck at 0830 today (with last troponin)  Lorenza Evangelist 06/18/2016, 2:13 AM

## 2016-06-18 NOTE — Consult Note (Signed)
Name: Richard [name] MRN: [id number] DOB: 1976-05-16    ADMISSION DATE:  06/17/2016 CONSULTATION DATE:  06/18/2016  REFERRING MD :  Kathlen Mody, M.D. / [facility]  CHIEF COMPLAINT:  Chest Pain with Pulmonary Emboli  BRIEF PATIENT DESCRIPTION: 40 y.o. 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  SIGNIFICANT EVENTS  11/20 - Admit w/ Bilateral Pulmonary Emboli   STUDIES:  TTE 11/20: Large thrombus in LV with severely dilated cavity. EF 15%. Diffuse hypokinesis. Elevated LV end-diastolic pressure and left atrial pressure. LA severely dilated & RA moderately dilated. RV moderately dilated. Pulmonary artery systolic pressure 44 mmHg. No aortic stenosis or regurgitation. Mild mitral regurgitation. Mild pulmonic regurgitation. Mild tricuspid regurgitation. Trivial pericardial effusion. CTA 11/20: Multiple bilateral pulmonary emboli involving segmental and subsegmental branches. RV/LV ratio 0.88 with reflux of contrast material into the hepatic veins. Small left pleural effusion. Focal opacity right lower lobe & left upper lobe.  HISTORY OF PRESENT ILLNESS:  40 y.o. male with know history of any medical problems. Patient presented complaining of dyspnea and chest discomfort for approximately one month. In the emergency department the patient was found to have bilateral pulmonary emboli and started on systemic anticoagulation with a heparin infusion. He has continued to have intermittent coughing productive of a bloody phlegm. He is also experiencing some pleuritic chest which seems to be responding to narcotic medications. Denies any headache or focal vision changes. He denies any subjective fever, chills, or sweats.   PAST MEDICAL HISTORY :  None chronically & no herbals.  PAST SURGICAL HISTORY: None.  HOME MEDICATIONS: None.  No Known Allergies  FAMILY HISTORY:  Family History  Problem Relation  Age of Onset  . Other Mother     Neg family history of clotting disorder or heart disease  . Clotting disorder Neg Hx   . Rheumatologic disease Neg Hx    SOCIAL HISTORY: Social History   Social History  . Marital status: Single    Spouse name: N/A  . Number of children: N/A  . Years of education: N/A   Social History Main Topics  . Smoking status: Never Smoker  . Smokeless tobacco: Never Used  . Alcohol use Yes     Comment: rare  . Drug use: No  . Sexual activity: Not Asked   Other Topics Concern  . None   Social History Narrative  . None    REVIEW OF SYSTEMS:  Denies any joint pain, swelling, or erythema. Denies any reflux. He has had some nausea and vomiting but denies any abdominal pain at this time. A pertinent 14 point review of systems is negative except as per the history of presenting illness.  SUBJECTIVE: As above.  VITAL SIGNS: Temp:  [97.3 F (36.3 C)-99 F (37.2 C)] 98.7 F (37.1 C) (11/21 0400) Pulse Rate:  [43-114] 48 (11/21 0700) Resp:  [0-27] 25 (11/21 0700) BP: (104-131)/(76-103) 131/76 (11/21 0700) SpO2:  [94 %-100 %] 99 % (11/21 0700)  PHYSICAL EXAMINATION: General:  Awake. Alert. No acute distress. Girlfriend at bedside.  Integument:  Warm & dry. No rash on exposed skin. No bruising. Tattoos noted. Lymphatics:  No appreciated cervical or supraclavicular lymphadenoapthy. HEENT:  Moist mucus membranes. No oral ulcers. No scleral injection or icterus.  Cardiovascular:  Regular rate. No edema. Normal S1 & S2.  Pulmonary:  Shallow inspiration with pain. Symmetric chest wall expansion. No accessory muscle use on nasal cannula oxygen. Abdomen: Soft. Normal bowel sounds.  Protuberant. Grossly nontender. Musculoskeletal:  Normal bulk and tone. Hand grip strength 5/5 bilaterally. No joint deformity or effusion appreciated. Neurological:  CN 2-12 grossly in tact. No meningismus. Moving all 4 extremities equally.  Psychiatric:  Mood and affect congruent.  Speech normal rhythm, rate & tone.    Recent Labs Lab 06/17/16 0401  NA 134*  K 4.3  CL 105  CO2 20*  BUN 13  CREATININE 0.95  GLUCOSE 179*    Recent Labs Lab 06/17/16 0401 06/18/16 0150  HGB 15.9 14.4  HCT 46.0 43.0  WBC 8.5 7.5  PLT 187 176   Dg Chest 2 View  Result Date: 06/17/2016 CLINICAL DATA:  Chest pain, hemoptysis and vomiting EXAM: CHEST  2 VIEW COMPARISON:  Chest radiograph 05/13/2016 FINDINGS: Massive enlargement of the cardiomediastinal silhouette is unchanged. No pleural effusion or pneumothorax. There is right middle lobe atelectasis. The left lung is clear. No pulmonary edema. IMPRESSION: 1. Right middle lobe collapse. Superimposed infection would be difficult to exclude. 2. Unchanged massive cardiomegaly versus pericardial effusion. Electronically Signed   By: Deatra Robinson M.D.   On: 06/17/2016 04:20   Ct Angio Chest Pe W And/or Wo Contrast  Result Date: 06/17/2016 CLINICAL DATA:  Right chest pain radiating to the back. Pneumonia last month. Symptoms have not responded to antibiotics. Blood in sputum. Shortness of breath, tachycardia, hypoxia. EXAM: CT ANGIOGRAPHY CHEST WITH CONTRAST TECHNIQUE: Multidetector CT imaging of the chest was performed using the standard protocol during bolus administration of intravenous contrast. Multiplanar CT image reconstructions and MIPs were obtained to evaluate the vascular anatomy. CONTRAST:  100 mL Isovue 370 COMPARISON:  None. FINDINGS: Cardiovascular: Filling defects in the right lower lobe and left lingular pulmonary arteries consistent with pulmonary emboli. Cardiac enlargement. RV to LV ratio is upper limits of normal at 0.88. There is reflux of contrast material into the hepatic veins. Changes may indicate evidence of right heart strain. No aortic aneurysm. No pericardial effusion. Mediastinum/Nodes: Enlarged lymph nodes demonstrated throughout the mediastinum. Largest node is in the aortopulmonic window, measuring 15 mm  short axis dimension. Nodes are likely reactive. Esophagus is decompressed. Lungs/Pleura: Focal areas of airspace disease demonstrated in the left lingula, left lower lung, and right lower lung. These changes may represent multifocal pneumonia or infarcts. Small left pleural effusion. No pneumothorax. Upper Abdomen: No acute abnormality. Musculoskeletal: Degenerative changes in the spine. No destructive bone lesions. Review of the MIP images confirms the above findings. IMPRESSION: Positive for multiple bilateral pulmonary emboli involving segmental and subsegmental branches. Borderline RV to LV ratio of 0.88 and reflux of contrast material into the hepatic veins suggests possibility of right heart strain. Focal areas of airspace disease in the right lower lobe and left upper lung may indicate pulmonary infarcts or multifocal pneumonia. Small left pleural effusion. These results were called by telephone at the time of interpretation on 06/17/2016 at 5:28 am to PA [facility] , who verbally acknowledged these results. Electronically Signed   By: Burman Nieves M.D.   On: 06/17/2016 05:35    ASSESSMENT / PLAN:  40 y.o. male with bilateral pulmonary emboli.She has evidence of biventricular heart failure with LV thrombus. I question whether or not an autoimmune process could be precipitating our findings. Hypercoagulable workup is pending. Patient's troponin I 0.04 on presentation and clinical picture do not warrant systemic or catheter directed therapy in my opinion.  1. Bilateral pulmonary emboli: Recommend continuing systemic anticoagulation while awaiting hypercoagulable workup. Recommend considering hematology evaluation. 2. Chest pain:  Significant pleuritic component. Recommend continue pain management as per primary service. 3. Hemoptysis: Likely secondary to pulmonary infarct. Recommend continuing systemic anticoagulation.  4. Biventricular heart failure: Evaluation pending and management per  cardiology.  Woodward KuWeill be available as needed. Please notify me if there are any questions or further concerns.   Donna ChristenJennings E. Jamison NeighborNestor, M.D. [facility] Pager:  [phone] After 3pm or if no response, call [phone] 06/18/2016, 8:48 AM

## 2016-06-18 NOTE — Progress Notes (Signed)
PROGRESS NOTE    Richard [name]  NIO:[id number] DOB: 07-20-1976 DOA: 06/17/2016 PCP: No primary care provider on file.    Brief Narrative: Richard [name] a 40 y.o.malewith no significant medical history, presents to the ED with c/o SOB and chest pain. He was found to have multifocal pulmonary embolism, RV strain and pulmonary infarcts. He was started on IV heparin and admitted to step down.  Hypercoagulable work up ordered and 2 D echo ordered.  It showed Large thrombus burding in LV apex and distal septum The cavity size was severely dilated. Wall thickness was normal. The estimated ejection fraction was 15% Diffusehypokinesis. Doppler parameters are consistent with both elevated  ventricular end-diastolic filling pressure and elevated leftatrial filling pressure. Cardiology consulted for recommendations.   Assessment & Plan:   Principal Problem:   Pulmonary embolism with infarction [facility]) Active Problems:   Hemoptysis   Cardiomyopathy (HCC)   Elevated troponin   Abnormal liver function tests   NSVT (nonsustained ventricular tachycardia) (HCC)   Frequent PVCs   Acute thrombus of left ventricle (HCC)   Multiple bilateral pulmonary infarcts with hemoptysis: - admitted to stepdown for monitoring.  - on IV heparin and H&H between 15 to 14.  - monitor hemoglobin while on IV heparin/ hemoptysis.  - pain control with IV dilaudid.  - hypercoagulable work up in progress.  - homocysteine level is 9.7, anti thrombin activity is low at 59. Protein C and S levels are low.  Anti cardiolipid and B2 glycoprotein levels are minimal and negative.     Cardiomyopathy of unclear etiology: Cardiology consulted for recommendations.    Left Ventricular apex thrombus:  On IV heparin, transition to coumadin tomorrow.   Elevated liver function test on admission: possibly from liver congestion from right heart strain.      Elevated troponins from right heart strain.    DVT  prophylaxis: heparin.  Code Status: (Full) Family Communication: family at bedside.  Disposition Plan: monitor in stepdown.    Consultants:   PCCM  Cardiology.    Procedures: CT angiogram  Echocardiogram.   Venous duplex.   Antimicrobials: none.   Subjective: Right sided chest pain and right sided shoulder pain.  With hemoptysis.   Objective: Vitals:   06/18/16 1030 06/18/16 1208 06/18/16 1402 06/18/16 1626  BP: (!) 128/96 (!) 134/94 130/88 118/87  Pulse: (!) 108 (!) 106 (!) 106 (!) 111  Resp: (!) 24 17 17 19   Temp:  97.7 F (36.5 C)    TempSrc:  Oral    SpO2: 97% 99% 98% 99%  Weight:      Height:        Intake/Output Summary (Last 24 hours) at 06/18/16 1654 Last data filed at 06/18/16 1600  Gross per 24 hour  Intake              967 ml  Output              945 ml  Net               22 ml   Filed Weights   06/17/16 0349  Weight: 95.3 kg (210 lb)    Examination:  General exam: Appears calm and comfortable on 2 lit of [place] oxygen.  Respiratory system: Clear to auscultation. Respiratory effort normal. Cardiovascular system: S1 & S2 heard, RRR. No JVD, No pedal edema. Gastrointestinal system: Abdomen is nondistended, soft and nontender. No organomegaly or masses felt. Normal bowel sounds heard. Central nervous system: Alert and oriented. No focal  neurological deficits. Extremities: Symmetric 5 x 5 power. Skin: No rashes, lesions or ulcers Psychiatry: Judgement and insight appear normal. Mood & affect appropriate.     Data Reviewed: I have personally reviewed following labs and imaging studies  CBC:  Recent Labs Lab 06/17/16 0401 06/18/16 0150  WBC 8.5 7.5  HGB 15.9 14.4  HCT 46.0 43.0  MCV 85.5 87.0  PLT 187 176   Basic Metabolic Panel:  Recent Labs Lab 06/17/16 0401 06/18/16 0858  NA 134* 135  K 4.3 4.9  CL 105 104  CO2 20* 25  GLUCOSE 179* 136*  BUN 13 14  CREATININE 0.95 0.94  CALCIUM 8.2* 8.2*  MG  --  2.0   GFR: Estimated  Creatinine Clearance: 139.8 mL/min (by C-G formula based on SCr of 0.94 mg/dL). Liver Function Tests:  Recent Labs Lab 06/17/16 0401 06/18/16 0858  AST 87* 28  ALT 228* 24  ALKPHOS 115 53  BILITOT 2.8* 1.5*  PROT 6.4* 6.5  ALBUMIN 3.1* 2.6*   No results for input(s): LIPASE, AMYLASE in the last 168 hours. No results for input(s): AMMONIA in the last 168 hours. Coagulation Profile:  Recent Labs Lab 06/17/16 0546  INR 1.79   Cardiac Enzymes:  Recent Labs Lab 06/17/16 2031 06/18/16 0150 06/18/16 0858  TROPONINI 0.04* 0.04* 0.04*   BNP (last 3 results) No results for input(s): PROBNP in the last 8760 hours. HbA1C: No results for input(s): HGBA1C in the last 72 hours. CBG: No results for input(s): GLUCAP in the last 168 hours. Lipid Profile: No results for input(s): CHOL, HDL, LDLCALC, TRIG, CHOLHDL, LDLDIRECT in the last 72 hours. Thyroid Function Tests: No results for input(s): TSH, T4TOTAL, FREET4, T3FREE, THYROIDAB in the last 72 hours. Anemia Panel: No results for input(s): VITAMINB12, FOLATE, FERRITIN, TIBC, IRON, RETICCTPCT in the last 72 hours. Sepsis Labs: No results for input(s): PROCALCITON, LATICACIDVEN in the last 168 hours.  Recent Results (from the past 240 hour(s))  MRSA PCR Screening     Status: None   Collection Time: 06/17/16  6:34 AM  Result Value Ref Range Status   MRSA by PCR NEGATIVE NEGATIVE Final    Comment:        The GeneXpert MRSA Assay (FDA approved for NASAL specimens only), is one component of a comprehensive MRSA colonization surveillance program. It is not intended to diagnose MRSA infection nor to guide or monitor treatment for MRSA infections.          Radiology Studies: Dg Chest 2 View  Result Date: 06/17/2016 CLINICAL DATA:  Chest pain, hemoptysis and vomiting EXAM: CHEST  2 VIEW COMPARISON:  Chest radiograph 05/13/2016 FINDINGS: Massive enlargement of the cardiomediastinal silhouette is unchanged. No pleural  effusion or pneumothorax. There is right middle lobe atelectasis. The left lung is clear. No pulmonary edema. IMPRESSION: 1. Right middle lobe collapse. Superimposed infection would be difficult to exclude. 2. Unchanged massive cardiomegaly versus pericardial effusion. Electronically Signed   By: Richard RobinsonKevin  [name] M.D.   On: 06/17/2016 04:20   Ct Angio Chest Pe W And/or Wo Contrast  Result Date: 06/17/2016 CLINICAL DATA:  Right chest pain radiating to the back. Pneumonia last month. Symptoms have not responded to antibiotics. Blood in sputum. Shortness of breath, tachycardia, hypoxia. EXAM: CT ANGIOGRAPHY CHEST WITH CONTRAST TECHNIQUE: Multidetector CT imaging of the chest was performed using the standard protocol during bolus administration of intravenous contrast. Multiplanar CT image reconstructions and MIPs were obtained to evaluate the vascular anatomy. CONTRAST:  100 mL Isovue  370 COMPARISON:  None. FINDINGS: Cardiovascular: Filling defects in the right lower lobe and left lingular pulmonary arteries consistent with pulmonary emboli. Cardiac enlargement. RV to LV ratio is upper limits of normal at 0.88. There is reflux of contrast material into the hepatic veins. Changes may indicate evidence of right heart strain. No aortic aneurysm. No pericardial effusion. Mediastinum/Nodes: Enlarged lymph nodes demonstrated throughout the mediastinum. Largest node is in the aortopulmonic window, measuring 15 mm short axis dimension. Nodes are likely reactive. Esophagus is decompressed. Lungs/Pleura: Focal areas of airspace disease demonstrated in the left lingula, left lower lung, and right lower lung. These changes may represent multifocal pneumonia or infarcts. Small left pleural effusion. No pneumothorax. Upper Abdomen: No acute abnormality. Musculoskeletal: Degenerative changes in the spine. No destructive bone lesions. Review of the MIP images confirms the above findings. IMPRESSION: Positive for multiple bilateral  pulmonary emboli involving segmental and subsegmental branches. Borderline RV to LV ratio of 0.88 and reflux of contrast material into the hepatic veins suggests possibility of right heart strain. Focal areas of airspace disease in the right lower lobe and left upper lung may indicate pulmonary infarcts or multifocal pneumonia. Small left pleural effusion. These results were called by telephone at the time of interpretation on 06/17/2016 at 5:28 am to PA [facility] , who verbally acknowledged these results. Electronically Signed   By: Burman Nieves M.D.   On: 06/17/2016 05:35        Scheduled Meds: . sodium chloride flush  3 mL Intravenous Q12H   Continuous Infusions: . heparin 1,900 Units/hr (06/18/16 1048)     LOS: 1 day    Time spent: 45 minutes.     Kathlen Mody, MD [facility] Pager [phone]   If 7PM-7AM, please contact night-coverage [email or web address] Password [facility] 06/18/2016, 4:54 PM

## 2016-06-18 NOTE — Consult Note (Addendum)
Cardiology Consultation Note    Patient ID: Richard [name], MRN: [id number], DOB/AGE: [birth year] 40 y.o. Admit date: 06/17/2016   Date of Consult: 06/18/2016 Primary Physician: No primary care provider on file. Primary Cardiologist: New to Dr. Allyson Sabal  Chief Complaint: chest pain Reason for Consultation: EF 15%, LV thrombus, bilateral PE Requesting MD: Dr. Blake Divine  HPI: Richard [name] is a 40 y.o. male with no significant PMH who presented to [facility] with chest pain, SOB, and hemoptysis since September 2017. Per his report, he went to an urgent care sometime in September and was told he had PNA and was prescribed an antibiotic. He had some improvement in symptoms initially. He flew to [place] for a trip shortly after this. However, symptoms recurred, prompting him to go to [facility] for productive cough and vomiting.Marland Kitchen His CXR from 05/14/16 was read out as normal heart size, mild left basilar opacity may reflect atx or possibly mild infection. He was placed on doxycyline and Prilosec. Over the next several weeks he began to feel more SOB even at rest, with chest pain worse with inspiration radiating to his shoulder and back. He also developed some hemoptysis prompting him to come to the [facility] ED. He was found to be tachycardic, tachypneic, and with elevated d-dimer. CXR showed RML collapse, unchanged massive cardiomegaly when comparing to CXR 05/13/16 (prev read as normal heart size). CTA showed muiltiple bilateral PEs involving segmental and subsegmental branches, reflux into hepatic veins suggesting right heart strain, focal areas of airspace disease in the right lower lobe and left upper lung may indicate pulmonary infarcts or multifocal pneumonia, small left pleural effusion. He had minimal trop leak to 0.04. 2D Echo 06/17/16 showed large thrombus burden in LV apex and distal septum, LV severely dilated, EF 15%, elevated LVEDP/LA pressure, mild MR, mod dilated RV/RA, mild MR, PASP  . Telemetry notable for NSR with frequent PVCs, occasional bigeminy, and brief runs of NSVT ranging 3-12 beats. Labs otherwise notable for elevated LFTS (AST 87/ALT 228), Hgb 15.9, glucose 179. BP stable, HR low 100s. No personal or family history of clotting disorder. No recent surgery or bedrest. His travel fell in between the initial 2 evaluations. He continues to have chest discomfort particularly with inspiration. Denies significant LEE, weight change, increase in abdominal girth but has noticed nausea, vomiting, and decreased sense of taste and smell. Satting 98% on 2L. Case discussed with PCCM who will also see patient this AM.  Initial INR was 1.79 (not on anticoagulation).  History reviewed. No pertinent past medical history.    Surgical History: History reviewed. No pertinent surgical history.   Home Meds: Prior to Admission medications   Not on File    Inpatient Medications:  . sodium chloride flush  3 mL Intravenous Q12H   . heparin 1,900 Units/hr (06/17/16 2015)    Allergies: No Known Allergies  Social History   Social History  . Marital status: Single    Spouse name: N/A  . Number of children: N/A  . Years of education: N/A   Occupational History  . Not on file.   Social History Main Topics  . Smoking status: Never Smoker  . Smokeless tobacco: Never Used  . Alcohol use Yes     Comment: rare  . Drug use: No  . Sexual activity: Not on file   Other Topics Concern  . Not on file   Social History Narrative  . No narrative on file     Family History  Problem Relation Age of Onset  . Other Mother     Neg family history of clotting disorder or heart disease  . Clotting disorder Neg Hx      Review of Systems:see above, no fever, chills All other systems reviewed and are otherwise negative except as noted above.  Labs:  Recent Labs  06/17/16 2031 06/18/16 0150  TROPONINI 0.04* 0.04*   Lab Results  Component Value Date   WBC 7.5 06/18/2016     HGB 14.4 06/18/2016   HCT 43.0 06/18/2016   MCV 87.0 06/18/2016   PLT 176 06/18/2016     Recent Labs Lab 06/17/16 0401  NA 134*  K 4.3  CL 105  CO2 20*  BUN 13  CREATININE 0.95  CALCIUM 8.2*  PROT 6.4*  BILITOT 2.8*  ALKPHOS 115  ALT 228*  AST 87*  GLUCOSE 179*   No results found for: CHOL, HDL, LDLCALC, TRIG Lab Results  Component Value Date   DDIMER 15.83 (H) 06/17/2016    Radiology/Studies:  Dg Chest 2 View  Result Date: 06/17/2016 CLINICAL DATA:  Chest pain, hemoptysis and vomiting EXAM: CHEST  2 VIEW COMPARISON:  Chest radiograph 05/13/2016 FINDINGS: Massive enlargement of the cardiomediastinal silhouette is unchanged. No pleural effusion or pneumothorax. There is right middle lobe atelectasis. The left lung is clear. No pulmonary edema. IMPRESSION: 1. Right middle lobe collapse. Superimposed infection would be difficult to exclude. 2. Unchanged massive cardiomegaly versus pericardial effusion. Electronically Signed   By: Deatra Robinson M.D.   On: 06/17/2016 04:20   Ct Angio Chest Pe W And/or Wo Contrast  Result Date: 06/17/2016 CLINICAL DATA:  Right chest pain radiating to the back. Pneumonia last month. Symptoms have not responded to antibiotics. Blood in sputum. Shortness of breath, tachycardia, hypoxia. EXAM: CT ANGIOGRAPHY CHEST WITH CONTRAST TECHNIQUE: Multidetector CT imaging of the chest was performed using the standard protocol during bolus administration of intravenous contrast. Multiplanar CT image reconstructions and MIPs were obtained to evaluate the vascular anatomy. CONTRAST:  100 mL Isovue 370 COMPARISON:  None. FINDINGS: Cardiovascular: Filling defects in the right lower lobe and left lingular pulmonary arteries consistent with pulmonary emboli. Cardiac enlargement. RV to LV ratio is upper limits of normal at 0.88. There is reflux of contrast material into the hepatic veins. Changes may indicate evidence of right heart strain. No aortic aneurysm. No  pericardial effusion. Mediastinum/Nodes: Enlarged lymph nodes demonstrated throughout the mediastinum. Largest node is in the aortopulmonic window, measuring 15 mm short axis dimension. Nodes are likely reactive. Esophagus is decompressed. Lungs/Pleura: Focal areas of airspace disease demonstrated in the left lingula, left lower lung, and right lower lung. These changes may represent multifocal pneumonia or infarcts. Small left pleural effusion. No pneumothorax. Upper Abdomen: No acute abnormality. Musculoskeletal: Degenerative changes in the spine. No destructive bone lesions. Review of the MIP images confirms the above findings. IMPRESSION: Positive for multiple bilateral pulmonary emboli involving segmental and subsegmental branches. Borderline RV to LV ratio of 0.88 and reflux of contrast material into the hepatic veins suggests possibility of right heart strain. Focal areas of airspace disease in the right lower lobe and left upper lung may indicate pulmonary infarcts or multifocal pneumonia. Small left pleural effusion. These results were called by telephone at the time of interpretation on 06/17/2016 at 5:28 am to PA [facility] , who verbally acknowledged these results. Electronically Signed   By: Burman Nieves M.D.   On: 06/17/2016 05:35    Wt Readings from Last 3 Encounters:  06/17/16 210 lb (95.3 kg)    EKG:  Initial EKG: sinus tach 115bpm, possible prior anterior infarct, nonspecific ST-T changes with flattening in V5, TWI V6 F/u this AM: sinus tach 103bpm, similar to prior except frequent PVCs, nonspecific changes   Physical Exam: Blood pressure 131/76, pulse (!) 48, temperature 98.7 F (37.1 C), temperature source Oral, resp. rate (!) 25, height 6\' 7"  (2.007 m), weight 210 lb (95.3 kg), SpO2 99 %. Body mass index is 23.66 kg/m. General: Well developed uncomfortable appearing AAM in no acute distress. Head: Normocephalic, atraumatic, sclera non-icteric, no xanthomas, nares are  without discharge.  Neck: Negative for carotid bruits. Possible mild JVD elevation. Lungs: Unable to take a deep breath due to chest pain. No wheezes, rales, rhonchi appreciated. Breathing is unlabored. Heart: RRR with S1 S2. No murmurs, rubs, or gallops appreciated. Abdomen: Soft, non-tender, rounded with normoactive bowel sounds. No hepatomegaly. No rebound/guarding. No obvious abdominal masses. Msk:  Strength and tone appear normal for age. Extremities: No clubbing or cyanosis. Trace pedal edema bilaterally.  Distal pedal pulses are 2+ and equal bilaterally. Neuro: Alert and oriented X 3. No facial asymmetry. No focal deficit. Moves all extremities spontaneously. Skin: tattoo left neck noted Psych:  Responds to questions appropriately with a normal affect.     Assessment and Plan  89M with no significant PMH who [facility] with chest pain, SOB, and hemoptysis since September 2017, found to have bilateral PEs with right heart strain, troponin 0.04, LVEF 15% with large thrombus burden. [place], no family history of clotting disorder. CXR 05/13/16 with cardiomegaly.   1. Acute bilateral PE c/b right heart strain, possible pulm infarcts, mild hemoptysis 2. New cardiomyopathy of undetermined etiology EF 15% 3. Large LV thrombus 4. Elevated troponin likely demand ischemia in the setting of the above 5. PVCs/NSVT 6. Transaminitis ? r/t hepatic reflux with elevated INR on admission  I will review this complex patient with cardiologist - further recs pending.  Signed, Laurann Montanaayna N Dunn PA-C 06/18/2016, 8:11 AM Pager: [phone]  Agree with findings by Ronie Spiesayna Dunn PA-C  Ill appearing 40 y/o BM with 2-3 month H/O cough and SOB. No prior cardiac or pulm Hx. No tobacco. Admitted with pleuritic CP and hemoptysis. He has a dilated severely hypokinetic LV and dilated RV (EF 15%) with Pulm HTN and a large apical mural thrombus. He is auto anticoagulated with mild  INR elevation prob secondary to hepatic congestion. In addition he has Bilateral PEs. Currently only on IV hep. 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 His VS are stable and he is mildly HTN with HR in 90-110. No indication for systemic or catheter directed lytics at this time. Will need to be on long tern coumadin AC. Start low dose coreg and ACE-I  As BP tolerates. Will prob require a LifeVest prior to DC. Sats OK on 2 L. Check lower extrem venous duplex. Needs thrombophia/ hypercoag w/u. May benefit from Heme Consult as well.   Runell GessJonathan J. Rashidi Loh, M.D., FACP, [facility] Memorial HospitalFACC, Earl LagosFAHA, [facility] Land HospitalFSCAI [facility] [address] [place], [place]  [id number]  [phone] 06/18/2016 10:44 AM

## 2016-06-19 ENCOUNTER — Inpatient Hospital Stay (HOSPITAL_COMMUNITY): Payer: Medicaid Other

## 2016-06-19 DIAGNOSIS — R0602 Shortness of breath: Secondary | ICD-10-CM

## 2016-06-19 DIAGNOSIS — I420 Dilated cardiomyopathy: Secondary | ICD-10-CM

## 2016-06-19 DIAGNOSIS — I472 Ventricular tachycardia: Secondary | ICD-10-CM

## 2016-06-19 DIAGNOSIS — I2699 Other pulmonary embolism without acute cor pulmonale: Secondary | ICD-10-CM

## 2016-06-19 DIAGNOSIS — I493 Ventricular premature depolarization: Secondary | ICD-10-CM

## 2016-06-19 DIAGNOSIS — J90 Pleural effusion, not elsewhere classified: Secondary | ICD-10-CM

## 2016-06-19 LAB — HEPARIN LEVEL (UNFRACTIONATED)
Heparin Unfractionated: 0.24 IU/mL — ABNORMAL LOW (ref 0.30–0.70)
Heparin Unfractionated: 0.29 IU/mL — ABNORMAL LOW (ref 0.30–0.70)
Heparin Unfractionated: 0.36 IU/mL (ref 0.30–0.70)

## 2016-06-19 LAB — BASIC METABOLIC PANEL
Anion gap: 6 (ref 5–15)
BUN: 20 mg/dL (ref 6–20)
CHLORIDE: 101 mmol/L (ref 101–111)
CO2: 28 mmol/L (ref 22–32)
CREATININE: 1.17 mg/dL (ref 0.61–1.24)
Calcium: 8.3 mg/dL — ABNORMAL LOW (ref 8.9–10.3)
GFR calc Af Amer: >60 mL/min (ref >60)
GFR calc non Af Amer: >60 mL/min (ref >60)
Glucose, Bld: 133 mg/dL — ABNORMAL HIGH (ref 65–99)
Potassium: 5.2 mmol/L — ABNORMAL HIGH (ref 3.5–5.1)
Sodium: 135 mmol/L (ref 135–145)

## 2016-06-19 LAB — CBC
HEMATOCRIT: 42.4 % (ref 39.0–52.0)
Hemoglobin: 14 g/dL (ref 13.0–17.0)
MCH: 29.2 pg (ref 26.0–34.0)
MCHC: 33 g/dL (ref 30.0–36.0)
MCV: 88.3 fL (ref 78.0–100.0)
Platelets: 205 10*3/uL (ref 150–400)
RBC: 4.8 MIL/uL (ref 4.22–5.81)
RDW: 15.4 % (ref 11.5–15.5)
WBC: 6.5 10*3/uL (ref 4.0–10.5)

## 2016-06-19 MED ORDER — METOPROLOL SUCCINATE ER 25 MG PO TB24
12.5000 mg | ORAL_TABLET | Freq: Every day | ORAL | Status: DC
  Administered 2016-06-19 – 2016-06-21 (×3): 12.5 mg via ORAL
  Filled 2016-06-19 (×3): qty 1

## 2016-06-19 MED ORDER — WARFARIN - PHARMACIST DOSING INPATIENT: Freq: Every day | Status: DC

## 2016-06-19 MED ORDER — COUMADIN BOOK
Freq: Once | Status: AC
  Administered 2016-06-19: 1
  Filled 2016-06-19: qty 1

## 2016-06-19 MED ORDER — LIVING BETTER WITH HEART FAILURE BOOK
Freq: Once | Status: AC
  Administered 2016-06-19: 09:00:00

## 2016-06-19 MED ORDER — WITCH HAZEL-GLYCERIN EX PADS
MEDICATED_PAD | CUTANEOUS | Status: DC | PRN
  Filled 2016-06-19: qty 100

## 2016-06-19 MED ORDER — WARFARIN SODIUM 5 MG PO TABS
5.0000 mg | ORAL_TABLET | Freq: Once | ORAL | Status: AC
  Administered 2016-06-19: 5 mg via ORAL
  Filled 2016-06-19: qty 1

## 2016-06-19 NOTE — Progress Notes (Signed)
Patient weight using ICU bed scale 124.7kg.  No previous weight was documented using ICU bed.  Last entered weight on 11/20 was 95.3kg, weight used unknown.  Standing weight obtained using scale, and correlated with bed weight at 125.8kg.

## 2016-06-19 NOTE — Progress Notes (Signed)
Patient Name: Richard [name] Date of Encounter: 06/19/2016  Primary Cardiologist: New to Dr. Crista CurbBerry  Hospital Problem List     Principal Problem:   Pulmonary embolism with infarction [facility](HCC) Active Problems:   Hemoptysis   Cardiomyopathy (HCC)   Elevated troponin   Abnormal liver function tests   NSVT (nonsustained ventricular tachycardia) (HCC)   Frequent PVCs   Acute thrombus of left ventricle (HCC)    Subjective   Chest pain is better today. Hemoptysis continues. Can take a deeper breath than yesterday.  Inpatient Medications    . coumadin book   Does not apply Once  . Living Better with Heart Failure Book   Does not apply Once  . metoprolol succinate  12.5 mg Oral Daily  . sodium chloride flush  3 mL Intravenous Q12H    Vital Signs    Vitals:   06/19/16 0320 06/19/16 0423 06/19/16 0436 06/19/16 0800  BP:  118/85    Pulse:  100    Resp:  14    Temp: 97.6 F (36.4 C)   97.9 F (36.6 C)  TempSrc: Oral   Oral  SpO2:  100%    Weight:   277 lb 5.4 oz (125.8 kg)   Height:        Intake/Output Summary (Last 24 hours) at 06/19/16 0817 Last data filed at 06/19/16 0600  Gross per 24 hour  Intake             1368 ml  Output             1100 ml  Net              268 ml   Filed Weights   06/17/16 0349 06/19/16 0436  Weight: 210 lb (95.3 kg) 277 lb 5.4 oz (125.8 kg)    Physical Exam    General: Well developed, well nourished obese AAM, in no acute distress. HEENT: Normocephalic, atraumatic, sclera non-icteric, no xanthomas, nares are without discharge. Neck: Negative for carotid bruits. JVP not elevated. Lungs: Clear bilaterally to auscultation without wheezes, rales, or rhonchi. Breathing is unlabored. Cardiac: RRR mildly elevated rate S1 S2 without murmurs, rubs, or gallops.  Abdomen: Soft, non-tender, non-distended with normoactive bowel sounds. No rebound/guarding. Extremities: No clubbing or cyanosis. Soft puffy BLE edema. Distal pedal pulses are 2+ and  equal bilaterally. Skin: Warm and dry, no significant rash. Neuro: Alert and oriented X 3. Sensation in tact. Follows commands. Psych:  Responds to questions appropriately with a normal affect.  Labs    CBC  Recent Labs  06/18/16 0150 06/19/16 0308  WBC 7.5 6.5  HGB 14.4 14.0  HCT 43.0 42.4  MCV 87.0 88.3  PLT 176 205   Basic Metabolic Panel  Recent Labs  06/18/16 0858 06/19/16 0308  NA 135 135  K 4.9 5.2*  CL 104 101  CO2 25 28  GLUCOSE 136* 133*  BUN 14 20  CREATININE 0.94 1.17  CALCIUM 8.2* 8.3*  MG 2.0  --    Liver Function Tests  Recent Labs  06/17/16 0401 06/18/16 0858  AST 87* 28  ALT 228* 24  ALKPHOS 115 53  BILITOT 2.8* 1.5*  PROT 6.4* 6.5  ALBUMIN 3.1* 2.6*   No results for input(s): LIPASE, AMYLASE in the last 72 hours. Cardiac Enzymes  Recent Labs  06/17/16 2031 06/18/16 0150 06/18/16 0858  TROPONINI 0.04* 0.04* 0.04*   BNP Invalid input(s): POCBNP D-Dimer  Recent Labs  06/17/16 0401  DDIMER 15.83*  Telemetry    NSR occ PVCs, brief NSVT   Radiology    Dg Chest 2 View  Result Date: 06/17/2016 CLINICAL DATA:  Chest pain, hemoptysis and vomiting EXAM: CHEST  2 VIEW COMPARISON:  Chest radiograph 05/13/2016 FINDINGS: Massive enlargement of the cardiomediastinal silhouette is unchanged. No pleural effusion or pneumothorax. There is right middle lobe atelectasis. The left lung is clear. No pulmonary edema. IMPRESSION: 1. Right middle lobe collapse. Superimposed infection would be difficult to exclude. 2. Unchanged massive cardiomegaly versus pericardial effusion. Electronically Signed   By: Deatra Robinson M.D.   On: 06/17/2016 04:20   Ct Angio Chest Pe W And/or Wo Contrast  Result Date: 06/17/2016 CLINICAL DATA:  Right chest pain radiating to the back. Pneumonia last month. Symptoms have not responded to antibiotics. Blood in sputum. Shortness of breath, tachycardia, hypoxia. EXAM: CT ANGIOGRAPHY CHEST WITH CONTRAST TECHNIQUE:  Multidetector CT imaging of the chest was performed using the standard protocol during bolus administration of intravenous contrast. Multiplanar CT image reconstructions and MIPs were obtained to evaluate the vascular anatomy. CONTRAST:  100 mL Isovue 370 COMPARISON:  None. FINDINGS: Cardiovascular: Filling defects in the right lower lobe and left lingular pulmonary arteries consistent with pulmonary emboli. Cardiac enlargement. RV to LV ratio is upper limits of normal at 0.88. There is reflux of contrast material into the hepatic veins. Changes may indicate evidence of right heart strain. No aortic aneurysm. No pericardial effusion. Mediastinum/Nodes: Enlarged lymph nodes demonstrated throughout the mediastinum. Largest node is in the aortopulmonic window, measuring 15 mm short axis dimension. Nodes are likely reactive. Esophagus is decompressed. Lungs/Pleura: Focal areas of airspace disease demonstrated in the left lingula, left lower lung, and right lower lung. These changes may represent multifocal pneumonia or infarcts. Small left pleural effusion. No pneumothorax. Upper Abdomen: No acute abnormality. Musculoskeletal: Degenerative changes in the spine. No destructive bone lesions. Review of the MIP images confirms the above findings. IMPRESSION: Positive for multiple bilateral pulmonary emboli involving segmental and subsegmental branches. Borderline RV to LV ratio of 0.88 and reflux of contrast material into the hepatic veins suggests possibility of right heart strain. Focal areas of airspace disease in the right lower lobe and left upper lung may indicate pulmonary infarcts or multifocal pneumonia. Small left pleural effusion. These results were called by telephone at the time of interpretation on 06/17/2016 at 5:28 am to PA [facility] , who verbally acknowledged these results. Electronically Signed   By: Burman Nieves M.D.   On: 06/17/2016 05:35     Patient Profile     27M with no significant PMH  other than remote tobacco abuse who presented to [facility] with chest pain, SOB, and hemoptysis since September 2017, found to have bilateral PEs with right heart strain, troponin 0.04, LVEF 15% with large thrombus burden. Timeline: Tx for PNA in Sept 2017, traveled to [facility] October 2017, [facility] ED visit for SOB/cough October 17th (CXR showing cardiomegaly), admitted November 20 with the above.   Assessment & Plan    1. Bilateral PE c/b right heart strain, possible pulm infarcts, mild hemoptysis - unclear if travel to [facility] was precipitant; IM plans to consult hematology. Currently on heparin. Start coumadin when OK with IM. LE duplex pending.  2. 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 His CXR in 04/2016 at [facility] showed significant cardiomegaly suggesting this is more of a chronic than acute process. Echo without  specific WMA. Start low dose BB today. Hold off on ACEI given hyperkalemia and softer BP. Will review question of ischemic w/u with MD - poor candidate for invasive cath at present time with acute clots. Note no coronary calcium mentioned on CT angio. May need to consider coronary CTA in the outpatient setting versus stress test once over acute illness. Will need to consider LifeVest vs EP consult closer to D/C given his ventricular ectopy. Educated patient on low sodium diet, fluid restriction - he is drinking a lot of fluid here. Will adjust diet order. Will consult dietician for new CHF education as well as Coumadin.  3. Large LV thrombus - anticoagulation as above. No data for NOACs in this setting, thus Coumadin will need to be used.   4. Elevated troponin likely demand ischemia in the setting of the above - very flat trend, arguing against ACS.  5. PVCs/NSVT - start low dose BB.  6. Transaminitis ? r/t hepatic reflux with elevated INR on admission - LFTs improved yesterday AM. Need to  follow INR closely once back on Coumadin.   Signed, Laurann Montana, PA-C  06/19/2016, 8:17 AM   Agree with findings by Ronie Spies PA-C  Feeling better today. Less SOB and less pleuritic CP. On IV hep. Would start Coumadin per pharm. Worry about hemoptysis prob related to pulm infarction from mult Bilat PEs. VSS. NSR with PVCs. I favor Coreg PO BID as opposed to Metop. Can start at 3.125 mg PO BID and itrtate to dose of 25 mg PO BID as OP. Would also start low dose ACE I and follow K and renal fxn. Will need LifeVest fitted prior to DC.  Runell Gess, M.D., FACP, [facility], [facility] [address] [place], [place]  [address]  [phone] 06/19/2016 12:28 PM

## 2016-06-19 NOTE — Progress Notes (Addendum)
ANTICOAGULATION CONSULT NOTE  Pharmacy Consult for Heparin Indication: Multiple bilateral Pulmonary Embolism with possible right heart strain  No Known Allergies  Patient Measurements: Height: 6\' 7"  (200.7 cm) Weight: 277 lb 5.4 oz (125.8 kg) IBW/kg (Calculated) : 93.7 Heparin Dosing Weight: actual body weight  Vital Signs: Temp: 97.6 F (36.4 C) (11/22 0320) Temp Source: Oral (11/22 0320) BP: 118/85 (11/22 0423) Pulse Rate: 100 (11/22 0423)  Labs:  Recent Labs  06/17/16 0401 06/17/16 0546  06/17/16 2031 06/18/16 0150 06/18/16 0858 06/19/16 0308  HGB 15.9  --   --   --  14.4  --  14.0  HCT 46.0  --   --   --  43.0  --  42.4  PLT 187  --   --   --  176  --  205  APTT  --  37*  --   --   --   --   --   LABPROT  --  21.0*  --   --   --   --   --   INR  --  1.79  --   --   --   --   --   HEPARINUNFRC  --   --   < >  --  0.33 0.35 0.24*  CREATININE 0.95  --   --   --   --  0.94 1.17  TROPONINI  --   --   --  0.04* 0.04* 0.04*  --   < > = values in this interval not displayed.  Estimated Creatinine Clearance: 127.7 mL/min (by C-G formula based on SCr of 1.17 mg/dL).   Medical History: History reviewed. No pertinent past medical history.  Medications:  No meds PTA  Assessment: 40 yr male with no significant PMH presents with complaint of shortness of breath and chest pain.  Recently completed course of Zpack with no resolution of symptoms.  Reports hemoptysis which had been worsening. Pharmacy to dose heparin for PE.  Significant events:  11/19: CTAngio shows bilateral pulmonary embolism with possible right heart strain  Today, 06/19/2016:  CBC: wnl, stable  Heparin level subtherapeutic this morning on 1900 units/hr  Patient reports continued hemoptysis (unchanged).  CrCl: > 90 ml/min  Goal of Therapy: Heparin level 0.3-0.7 units/ml Monitor platelets by anticoagulation protocol: Yes  Plan:  Increase heparin IV infusion to 2200 units/hr  Check  heparin level 6 hrs after rate change  Daily CBC, HL  Monitor closely for bleeding, hemoptysis  F/U plans to initiate Coumadin  Junita Push, PharmD, BCPS Pager: [phone] 06/19/2016, 7:39 AM   Will initiate Coumadin 5mg  po x1 today-->max recommended dose since baseline INR elevated @ 1.79 Daily PT/INR Will need minimum of 5 days overlap with IV heparin (or SQ Lovenox) or until INR therapeutic x24hrs- whichever is longer.  Initiate Coumadin education  Junita Push, PharmD, BCPS Pager: [phone] 06/19/2016@2 :06 PM  Repeat heparin level still <goal (0.29) Hemoptysis unchanged.  No other bleeding or line issues reported.  Plan: Increase heparin to 2350 units/hr Recheck 6hr heparin level Daily heparin level, CBC Patient educated on Coumadin.   Junita Push, PharmD, BCPS Pager: [phone] 06/19/2016@3 :07 PM

## 2016-06-19 NOTE — Progress Notes (Signed)
Nutrition Education Note  RD consulted for nutrition education regarding new onset CHF and new start on Coumadin.   RD provided "Heart Failure Nutrition Therapy," "Low Sodium Food List," "Sodium-free Seasoning list," and "Vitamin K Content of Foods" handouts from the Academy of [employer] and Dietetics. Pt states that he was recently given pain medication and is feeling "loopy, floating." RN confirms she recently gave pt Dilaudid and that earlier today she provided him with booklet about Coumadin and foods to limit with this medication. She states pt was encouraged to read this booklet, but that pt's girlfriend is very attentive to medical care/items such as diet and that she Banker) will inform girlfriend of handouts that RD left in the room.   RD discussed why it is important for patient to adhere to diet recommendations. Limited diet education occurred during this visit d/t recent pain medication administration and pt feeling he is not cognitively present enough to engage in education.   Expect fair compliance.  Body mass index is 31.24 kg/m. Pt meets criteria for obesity based on current BMI.  Current diet order is 2 gram Na. Labs and medications reviewed. No further nutrition interventions warranted at this time. RD contact information provided. If additional nutrition issues arise, please re-consult RD.     Trenton Gammon, MS, RD, LDN, [facility] Inpatient Clinical Dietitian Pager # [phone] After hours/weekend pager # [phone]

## 2016-06-19 NOTE — Progress Notes (Signed)
[facility] [phone]   [phone]  CONSULT NOTE  REFERRING PHYSICIAN: Dr. Albertine Grates  REASON FOR CONSULTATION:  40 years old African-American male with bilateral pulmonary embolism.  HPI Richard [name] is a 40 y.o. male with no significant past medical history who presented a few days ago to the emergency department complaining of shortness of breath, hemoptysis and chest pain radiating to the back started few weeks before his presentation and was getting worse. The patient mentions that he traveled by plane to [facility] several weeks ago. He denied having any pain or swelling in his lower extremities when he presented to the hospital. During his evaluation d-dimer was elevated at 15.83. CT angiogram of the chest performed on 06/17/2016 was positive for multiple bilateral pulmonary emboli involving segmental and subsegmental branches. Borderline RV to LV ratio of 0.88 and reflux of contrast material into the hepatic veins suggests possibility of right heart strain. Focal areas of airspace disease in the right lower lobe and left upper lung may indicate pulmonary infarcts or multifocal pneumonia. Small left pleural effusion. A hypercoagulable panel was ordered and the patient was started on IV heparin. He is currently being bridged to Coumadin. 2-D echo performed on 06/17/2016 showed ejection fraction of 15%. There was a large thrombus burden in the left ventricular apex and distal septum there was also diffuse hypokinesis. Doppler of the lower extremities performed earlier today was positive for an acute venous thrombosis in one of the right posterior tibial veins mid to proximal and popliteal vein. No evidence of deep venous thrombosis or superficial thrombosis in the left lower extremity. The hypercoagulable panel showed low total and activity of protein C, slight decrease in protein S activity but no lupus anticoagulant or abnormal homocysteine level. Factor V  Leiden and prothrombin gene mutations are still pending. Dr. Roda Shutters asking me to see the patient today for any other recommendation. When seen today he continues to have shortness of breath and mild hemoptysis. He has intermittent left-sided chest pain. He denied having any fever or chills. He has no nausea or vomiting. No headache or visual changes. Family history is unremarkable for any blood clotting disorder or malignancy. The patient is single and has 2 children. She works in International aid/development worker. He has no history of smoking, alcohol or drug abuse. His uncle Richard [name] was at the bedside today.  HPI  History reviewed. No pertinent past medical history.  History reviewed. No pertinent surgical history.  Family History  Problem Relation Age of Onset  . Other Mother     Neg family history of clotting disorder or heart disease  . Clotting disorder Neg Hx   . Rheumatologic disease Neg Hx     Social History Social History  Substance Use Topics  . Smoking status: Never Smoker  . Smokeless tobacco: Never Used  . Alcohol use Yes     Comment: rare    No Known Allergies  Current Facility-Administered Medications  Medication Dose Route Frequency Provider Last Rate Last Dose  . heparin ADULT infusion 100 units/mL (25000 units/236mL sodium chloride 0.45%)  2,350 Units/hr Intravenous Continuous Phylliss Blakes, RPH 23.5 mL/hr at 06/19/16 1505 2,350 Units/hr at 06/19/16 1505  . HYDROmorphone (DILAUDID) injection 1-2 mg  1-2 mg Intravenous Q4H PRN Kathlen Mody, MD   1 mg at 06/19/16 1552  . metoprolol succinate (TOPROL-XL) 24 hr tablet 12.5 mg  12.5 mg Oral Daily Dayna N Dunn, PA-C   12.5 mg at 06/19/16 1021  .  sodium chloride flush (NS) 0.9 % injection 3 mL  3 mL Intravenous Q12H Hillary BowJared M Gardner, DO   Stopped at 06/19/16 1000  . warfarin (COUMADIN) tablet 5 mg  5 mg Oral ONCE-1800 Phylliss BlakesAndrea M Lilliston, [facility] Inc[facility]PH      . [START ON 06/20/2016] Warfarin - Pharmacist Dosing Inpatient   Does not apply q1800  Phylliss Blakesndrea M Lilliston, [facility]PH      . witch hazel-glycerin (TUCKS) pad   Topical PRN Albertine GratesFang Xu, MD        Review of Systems  Constitutional: positive for fatigue Eyes: negative Ears, nose, mouth, throat, and face: negative Respiratory: positive for cough, dyspnea on exertion and hemoptysis Cardiovascular: positive for exertional chest pressure/discomfort Gastrointestinal: negative Genitourinary:negative Integument/breast: negative Hematologic/lymphatic: negative Musculoskeletal:negative Neurological: negative Behavioral/Psych: negative Endocrine: negative Allergic/Immunologic: negative  Physical Exam  ZOX:WRUEARAL:alert, healthy, no distress, well nourished, well developed and anxious SKIN: skin color, texture, turgor are normal, no rashes or significant lesions HEAD: Normocephalic, No masses, lesions, tenderness or abnormalities EYES: normal, PERRLA, Conjunctiva are pink and non-injected EARS: External ears normal, Canals clear OROPHARYNX:no exudate, no erythema and lips, buccal mucosa, and tongue normal  NECK: supple, no adenopathy, no JVD LYMPH:  no palpable lymphadenopathy, no hepatosplenomegaly LUNGS: expiratory wheezes bilaterally, scattered rales bilaterally HEART: regular rate & rhythm and no murmurs ABDOMEN:abdomen soft, non-tender, normal bowel sounds and no masses or organomegaly BACK: Back symmetric, no curvature., No CVA tenderness EXTREMITIES:no joint deformities, effusion, or inflammation, no edema, no skin discoloration  NEURO: alert & oriented x 3 with fluent speech, no focal motor/sensory deficits  PERFORMANCE STATUS: ECOG 1  LABORATORY DATA: Lab Results  Component Value Date   WBC 6.5 06/19/2016   HGB 14.0 06/19/2016   HCT 42.4 06/19/2016   MCV 88.3 06/19/2016   PLT 205 06/19/2016    @LASTCHEM @  RADIOGRAPHIC STUDIES: Dg Chest 2 View  Result Date: 06/17/2016 CLINICAL DATA:  Chest pain, hemoptysis and vomiting EXAM: CHEST  2 VIEW COMPARISON:  Chest radiograph  05/13/2016 FINDINGS: Massive enlargement of the cardiomediastinal silhouette is unchanged. No pleural effusion or pneumothorax. There is right middle lobe atelectasis. The left lung is clear. No pulmonary edema. IMPRESSION: 1. Right middle lobe collapse. Superimposed infection would be difficult to exclude. 2. Unchanged massive cardiomegaly versus pericardial effusion. Electronically Signed   By: Deatra RobinsonKevin  Herman M.D.   On: 06/17/2016 04:20   Ct Angio Chest Pe W And/or Wo Contrast  Result Date: 06/17/2016 CLINICAL DATA:  Right chest pain radiating to the back. Pneumonia last month. Symptoms have not responded to antibiotics. Blood in sputum. Shortness of breath, tachycardia, hypoxia. EXAM: CT ANGIOGRAPHY CHEST WITH CONTRAST TECHNIQUE: Multidetector CT imaging of the chest was performed using the standard protocol during bolus administration of intravenous contrast. Multiplanar CT image reconstructions and MIPs were obtained to evaluate the vascular anatomy. CONTRAST:  100 mL Isovue 370 COMPARISON:  None. FINDINGS: Cardiovascular: Filling defects in the right lower lobe and left lingular pulmonary arteries consistent with pulmonary emboli. Cardiac enlargement. RV to LV ratio is upper limits of normal at 0.88. There is reflux of contrast material into the hepatic veins. Changes may indicate evidence of right heart strain. No aortic aneurysm. No pericardial effusion. Mediastinum/Nodes: Enlarged lymph nodes demonstrated throughout the mediastinum. Largest node is in the aortopulmonic window, measuring 15 mm short axis dimension. Nodes are likely reactive. Esophagus is decompressed. Lungs/Pleura: Focal areas of airspace disease demonstrated in the left lingula, left lower lung, and right lower lung. These changes may represent multifocal pneumonia or  infarcts. Small left pleural effusion. No pneumothorax. Upper Abdomen: No acute abnormality. Musculoskeletal: Degenerative changes in the spine. No destructive bone  lesions. Review of the MIP images confirms the above findings. IMPRESSION: Positive for multiple bilateral pulmonary emboli involving segmental and subsegmental branches. Borderline RV to LV ratio of 0.88 and reflux of contrast material into the hepatic veins suggests possibility of right heart strain. Focal areas of airspace disease in the right lower lobe and left upper lung may indicate pulmonary infarcts or multifocal pneumonia. Small left pleural effusion. These results were called by telephone at the time of interpretation on 06/17/2016 at 5:28 am to PA [facility] , who verbally acknowledged these results. Electronically Signed   By: Burman Nieves M.D.   On: 06/17/2016 05:35    ASSESSMENT: This is a very pleasant 40 years old African-American male recently diagnosed with multiple bilateral pulmonary emboli in addition to a large left ventricular apical and distal septal thrombus. His hypercoagulable panel showed no concerning abnormalities except for a low protein C which may be secondary to his current massive thrombus and consumption process but it also could be the etiology for his pulmonary embolism. His hypercoagulable panel showed no evidence for lupus anticoagulant but factor V Leiden and prothrombin gene mutations are still pending.  PLAN: I had a lengthy discussion with the patient today about his condition and treatment options. I agree with the current plan by starting the patient on IV heparin and bridging to Coumadin for now. Xarelto or Eliquis may be a good options for this patient for the outpatient treatment but because the low ejection fraction and cardiac thrombosis I would defer to cardiology for the best option of his anticoagulation. I will arrange for the patient a follow-up appointment with me at the cancer Center after discharge for reevaluation of his condition and discussion of the remaining results of the hypercoagulable panel. Because of the massive bilateral  pulmonary embolism I would recommend for the patient to continue on anticoagulation long-term and probably for life if there is any abnormality in his hypercoagulable study. For pain management: Continue on Dilaudid as needed. The patient voices understanding of current disease status and treatment options and is in agreement with the current care plan.  All questions were answered. The patient knows to call the clinic with any problems, questions or concerns. We can certainly see the patient much sooner if necessary.  Thank you so much for allowing me to participate in the care of Richard [name]. I will continue to follow up the patient with you and assist in his care.  Disclaimer: This note was dictated with voice recognition software. Similar sounding words can inadvertently be transcribed and may not be corrected upon review.   Anina Schnake K. June 19, 2016, 3:57 PM

## 2016-06-19 NOTE — Progress Notes (Signed)
PROGRESS NOTE    Richard [name]  ZOX:[id number] DOB: May 31, 1976 DOA: 06/17/2016 PCP: No primary care provider on file.    Brief Narrative: Richard [name] a 40 y.o.malewith no significant medical history, presents to the ED with c/o SOB and chest pain. He was found to have multifocal pulmonary embolism, RV strain and pulmonary infarcts. He was started on IV heparin and admitted to step down.  Hypercoagulable work up ordered and 2 D echo ordered.  It showed Large thrombus burding in LV apex and distal septum The cavity size was severely dilated. Wall thickness was normal. The estimated ejection fraction was 15% Diffusehypokinesis. Doppler parameters are consistent with both elevated  ventricular end-diastolic filling pressure and elevated leftatrial filling pressure. Cardiology consulted for recommendations.   Assessment & Plan:   Principal Problem:   Pulmonary embolism with infarction [facility](HCC) Active Problems:   Hemoptysis   Cardiomyopathy (HCC)   Elevated troponin   Abnormal liver function tests   NSVT (nonsustained ventricular tachycardia) (HCC)   Frequent PVCs   Acute thrombus of left ventricle (HCC)   Multiple bilateral pulmonary infarcts with hemoptysis: - patient initially with tachycardia, tachypnea, chest pain, sob, admitted to stepdown - he was started on IV heparin, vital signs improving, transition to coumadin ( due to large left ventricle thrombus) - monitor hemoglobin , hemoptysis while on anticoagulation - hypercoagulable work up in progress.  homocysteine level is 9.7, anti thrombin activity is low at 59. Protein C and S levels are low.  Anti cardiolipid and B2 glycoprotein levels are minimal and negative. Factor 5 leiden, prothrombin gene mutation pending Bilateral venous doppler ordered Patient reported trip to [place] in [place], but his symptoms dated back to September -cardiology and critical care input appreciated, will also consult hematology   Left  Ventricular apex thrombus:  Started on IV heparin, transition to coumadin on 11/22 Cardiology following  Cardiomyopathy of unclear etiology: EF 15% Betablocker started on 11/22, may need to repeat echo prior to discharge and may need life vest , will follow cardiology recommendations  Elevated troponins from right heart strain.   Elevated liver function test on admission: possibly from liver congestion from right heart strain.       DVT prophylaxis: heparin. Then coumadin Code Status: (Full) Family Communication: significant other at bedside.  Disposition Plan: monitor in stepdown.    Consultants:   PCCM  Cardiology.   hematology   Procedures:   CT angiogram  Echocardiogram.   Venous duplex.   Antimicrobials: none.   Subjective:  Patient is seen with his significant other, RN and cardiology PA in room He report Feeling  Better,  Less chest pain, less hemoptysis, on one liter oxygen, denies sob at rest  He report some lower extremity edema which is new to him    Objective: Vitals:   06/19/16 0320 06/19/16 0423 06/19/16 0436 06/19/16 0800  BP:  118/85    Pulse:  100    Resp:  14    Temp: 97.6 F (36.4 C)   97.9 F (36.6 C)  TempSrc: Oral   Oral  SpO2:  100%    Weight:   125.8 kg (277 lb 5.4 oz)   Height:        Intake/Output Summary (Last 24 hours) at 06/19/16 0813 Last data filed at 06/19/16 0600  Gross per 24 hour  Intake             1368 ml  Output  1100 ml  Net              268 ml   Filed Weights   06/17/16 0349 06/19/16 0436  Weight: 95.3 kg (210 lb) 125.8 kg (277 lb 5.4 oz)    Examination:  General exam: Appears calm and comfortable on i lit of [place] oxygen.  Respiratory system: Clear to auscultation. Respiratory effort normal. Cardiovascular system: S1 & S2 heard, RRR. No JVD, No pedal edema. Gastrointestinal system: Abdomen is nondistended, soft and nontender. No organomegaly or masses felt. Normal bowel sounds  heard. Central nervous system: Alert and oriented. No focal neurological deficits. Extremities: Symmetric 5 x 5 power. Trace pitting edema bilateral lower extremity Skin: No rashes, lesions or ulcers Psychiatry: Judgement and insight appear normal. Mood & affect appropriate.     Data Reviewed: I have personally reviewed following labs and imaging studies  CBC:  Recent Labs Lab 06/17/16 0401 06/18/16 0150 06/19/16 0308  WBC 8.5 7.5 6.5  HGB 15.9 14.4 14.0  HCT 46.0 43.0 42.4  MCV 85.5 87.0 88.3  PLT 187 176 205   Basic Metabolic Panel:  Recent Labs Lab 06/17/16 0401 06/18/16 0858 06/19/16 0308  NA 134* 135 135  K 4.3 4.9 5.2*  CL 105 104 101  CO2 20* 25 28  GLUCOSE 179* 136* 133*  BUN 13 14 20   CREATININE 0.95 0.94 1.17  CALCIUM 8.2* 8.2* 8.3*  MG  --  2.0  --    GFR: Estimated Creatinine Clearance: 127.7 mL/min (by C-G formula based on SCr of 1.17 mg/dL). Liver Function Tests:  Recent Labs Lab 06/17/16 0401 06/18/16 0858  AST 87* 28  ALT 228* 24  ALKPHOS 115 53  BILITOT 2.8* 1.5*  PROT 6.4* 6.5  ALBUMIN 3.1* 2.6*   No results for input(s): LIPASE, AMYLASE in the last 168 hours. No results for input(s): AMMONIA in the last 168 hours. Coagulation Profile:  Recent Labs Lab 06/17/16 0546  INR 1.79   Cardiac Enzymes:  Recent Labs Lab 06/17/16 2031 06/18/16 0150 06/18/16 0858  TROPONINI 0.04* 0.04* 0.04*   BNP (last 3 results) No results for input(s): PROBNP in the last 8760 hours. HbA1C: No results for input(s): HGBA1C in the last 72 hours. CBG: No results for input(s): GLUCAP in the last 168 hours. Lipid Profile: No results for input(s): CHOL, HDL, LDLCALC, TRIG, CHOLHDL, LDLDIRECT in the last 72 hours. Thyroid Function Tests: No results for input(s): TSH, T4TOTAL, FREET4, T3FREE, THYROIDAB in the last 72 hours. Anemia Panel: No results for input(s): VITAMINB12, FOLATE, FERRITIN, TIBC, IRON, RETICCTPCT in the last 72 hours. Sepsis  Labs: No results for input(s): PROCALCITON, LATICACIDVEN in the last 168 hours.  Recent Results (from the past 240 hour(s))  MRSA PCR Screening     Status: None   Collection Time: 06/17/16  6:34 AM  Result Value Ref Range Status   MRSA by PCR NEGATIVE NEGATIVE Final    Comment:        The GeneXpert MRSA Assay (FDA approved for NASAL specimens only), is one component of a comprehensive MRSA colonization surveillance program. It is not intended to diagnose MRSA infection nor to guide or monitor treatment for MRSA infections.          Radiology Studies: No results found.      Scheduled Meds: . coumadin book   Does not apply Once  . sodium chloride flush  3 mL Intravenous Q12H   Continuous Infusions: . heparin 2,200 Units/hr (06/19/16 0740)  LOS: 2 days    Time spent: 45 minutes.     Albertine Grates, MD PhD [facility] Pager [phone]   If 7PM-7AM, please contact night-coverage [email or web address] Password [facility] 06/19/2016, 8:13 AM

## 2016-06-19 NOTE — Progress Notes (Signed)
VASCULAR LAB PRELIMINARY  PRELIMINARY  PRELIMINARY  PRELIMINARY  Bilateral lower extremity venous duplex completed.    Preliminary report : Right - Positive for an acute DVT noted in one of the posterior tibial veins mid to proximal and popliteal vein.  No evidence of superficial thrombosis.  No Baker's cyst. Left:  No evidence of DVT, superficial thrombosis, or Baker's cyst.  Nohlan Burdin, RVS 06/19/2016, 9:36 AM

## 2016-06-20 LAB — LIPID PANEL
Cholesterol: 81 mg/dL (ref 0–200)
HDL: 14 mg/dL — AB (ref >40)
LDL Cholesterol: 54 mg/dL (ref 0–99)
TRIGLYCERIDES: 67 mg/dL (ref <150)
Total CHOL/HDL Ratio: 5.8 RATIO
VLDL: 13 mg/dL (ref 0–40)

## 2016-06-20 LAB — CBC
HCT: 41.3 % (ref 39.0–52.0)
HEMOGLOBIN: 13.9 g/dL (ref 13.0–17.0)
MCH: 29 pg (ref 26.0–34.0)
MCHC: 33.7 g/dL (ref 30.0–36.0)
MCV: 86.2 fL (ref 78.0–100.0)
PLATELETS: 216 10*3/uL (ref 150–400)
RBC: 4.79 MIL/uL (ref 4.22–5.81)
RDW: 15.5 % (ref 11.5–15.5)
WBC: 6.7 10*3/uL (ref 4.0–10.5)

## 2016-06-20 LAB — COMPREHENSIVE METABOLIC PANEL
ALT: 30 U/L (ref 17–63)
AST: 47 U/L — ABNORMAL HIGH (ref 15–41)
Albumin: 2.3 g/dL — ABNORMAL LOW (ref 3.5–5.0)
Alkaline Phosphatase: 61 U/L (ref 38–126)
Anion gap: 6 (ref 5–15)
BILIRUBIN TOTAL: 1.5 mg/dL — AB (ref 0.3–1.2)
BUN: 17 mg/dL (ref 6–20)
CHLORIDE: 102 mmol/L (ref 101–111)
CO2: 28 mmol/L (ref 22–32)
Calcium: 8 mg/dL — ABNORMAL LOW (ref 8.9–10.3)
Creatinine, Ser: 0.93 mg/dL (ref 0.61–1.24)
Glucose, Bld: 122 mg/dL — ABNORMAL HIGH (ref 65–99)
POTASSIUM: 4.1 mmol/L (ref 3.5–5.1)
Sodium: 136 mmol/L (ref 135–145)
TOTAL PROTEIN: 6.1 g/dL — AB (ref 6.5–8.1)

## 2016-06-20 LAB — T4, FREE: FREE T4: 0.86 ng/dL (ref 0.61–1.12)

## 2016-06-20 LAB — TSH: TSH: 3.183 u[IU]/mL (ref 0.350–4.500)

## 2016-06-20 LAB — HEPARIN LEVEL (UNFRACTIONATED): HEPARIN UNFRACTIONATED: 0.36 [IU]/mL (ref 0.30–0.70)

## 2016-06-20 LAB — PROTIME-INR
INR: 1.44
PROTHROMBIN TIME: 17.6 s — AB (ref 11.4–15.2)

## 2016-06-20 MED ORDER — WARFARIN SODIUM 5 MG PO TABS
5.0000 mg | ORAL_TABLET | Freq: Once | ORAL | Status: AC
  Administered 2016-06-20: 5 mg via ORAL
  Filled 2016-06-20: qty 1

## 2016-06-20 NOTE — Progress Notes (Signed)
Patient has not voided since this am. Pt encouraged to void.

## 2016-06-20 NOTE — Progress Notes (Signed)
ANTICOAGULATION CONSULT NOTE  Pharmacy Consult for Heparin Indication: Multiple bilateral Pulmonary Embolism with possible right heart strain  No Known Allergies  Patient Measurements: Height: 6\' 7"  (200.7 cm) Weight: 283 lb 1.1 oz (128.4 kg) IBW/kg (Calculated) : 93.7 Heparin Dosing Weight: actual body weight  Vital Signs: Temp: 97.6 F (36.4 C) (11/23 0417) Temp Source: Oral (11/23 0417) BP: 97/83 (11/23 0400) Pulse Rate: 98 (11/23 0400)  Labs:  Recent Labs  06/17/16 2031  06/18/16 0150 06/18/16 0858 06/19/16 0308 06/19/16 1408 06/19/16 2148 06/20/16 0316  HGB  --   < > 14.4  --  14.0  --   --  13.9  HCT  --   --  43.0  --  42.4  --   --  41.3  PLT  --   --  176  --  205  --   --  216  LABPROT  --   --   --   --   --   --   --  17.6*  INR  --   --   --   --   --   --   --  1.44  HEPARINUNFRC  --   --  0.33 0.35 0.24* 0.29* 0.36 0.36  CREATININE  --   --   --  0.94 1.17  --   --  0.93  TROPONINI 0.04*  --  0.04* 0.04*  --   --   --   --   < > = values in this interval not displayed.  Estimated Creatinine Clearance: 162.3 mL/min (by C-G formula based on SCr of 0.93 mg/dL).   Medical History: History reviewed. No pertinent past medical history.  Medications:  No meds PTA  Assessment: 40 yr male with no significant PMH presents with complaint of shortness of breath and chest pain.  Recently completed course of Zpack with no resolution of symptoms.  Reports hemoptysis which had been worsening. Pharmacy to dose heparin for PE.  Significant events:  11/19: CTAngio shows bilateral pulmonary embolism with possible right heart strain  Today, 06/20/2016:  CBC: wnl, stable.  Patient reports continued hemoptysis (unchanged).  Heparin level subtherapeutic this morning on 2350 units/hr  INR at baseline as anticipated after single dose of Coumadin yesterday.  CrCl: > 90 ml/min  No drug-drug interactions  Goal of Therapy: Heparin level 0.3-0.7 units/ml Monitor  platelets by anticoagulation protocol: Yes  Plan:  Continue heparin IV infusion at 2350 units/hr  Daily CBC, HL  Monitor closely for bleeding, hemoptysis  Continue Coumadin 5mg  po x1 today  Junita Push, PharmD, BCPS Pager: [phone] 06/20/2016, 7:08 AM

## 2016-06-20 NOTE — Progress Notes (Signed)
ANTICOAGULATION CONSULT NOTE - Follow Up Consult  Pharmacy Consult for Heparin Indication: Multiple bilateral Pulmonary Embolism with possible right heart strain  No Known Allergies  Patient Measurements: Height: 6\' 7"  (200.7 cm) Weight: 277 lb 5.4 oz (125.8 kg) IBW/kg (Calculated) : 93.7 Heparin Dosing Weight:   Vital Signs: Temp: 97.6 F (36.4 C) (11/23 0417) Temp Source: Oral (11/23 0417) BP: 97/83 (11/23 0400) Pulse Rate: 98 (11/23 0400)  Labs:  Recent Labs  06/17/16 0546  06/17/16 2031  06/18/16 0150 06/18/16 0858 06/19/16 0308 06/19/16 1408 06/19/16 2148 06/20/16 0316  HGB  --   --   --   < > 14.4  --  14.0  --   --  13.9  HCT  --   --   --   --  43.0  --  42.4  --   --  41.3  PLT  --   --   --   --  176  --  205  --   --  216  APTT 37*  --   --   --   --   --   --   --   --   --   LABPROT 21.0*  --   --   --   --   --   --   --   --  17.6*  INR 1.79  --   --   --   --   --   --   --   --  1.44  HEPARINUNFRC  --   < >  --   --  0.33 0.35 0.24* 0.29* 0.36 0.36  CREATININE  --   --   --   --   --  0.94 1.17  --   --  0.93  TROPONINI  --   --  0.04*  --  0.04* 0.04*  --   --   --   --   < > = values in this interval not displayed.  Estimated Creatinine Clearance: 160.6 mL/min (by C-G formula based on SCr of 0.93 mg/dL).   Medications:  Infusions:  . heparin 2,350 Units/hr (06/19/16 2337)    Assessment: Patient with heparin level at goal.  No heparin issues noted.  Goal of Therapy:  Heparin level 0.3-0.7 units/ml Monitor platelets by anticoagulation protocol: Yes   Plan:  Continue heparin drip at current rate Recheck level with AM labs  Darlina Guys, Jacquenette Shone Crowford 06/20/2016,4:39 AM

## 2016-06-20 NOTE — Progress Notes (Signed)
PROGRESS NOTE    Richard [name]  HYH:[id number] DOB: 1976-07-01 DOA: 06/17/2016 PCP: No primary care provider on file.    Brief Narrative: Richard [name] a 40 y.o.malewith no significant medical history, presents to the ED with c/o SOB and chest pain. He was found to have multifocal pulmonary embolism, RV strain and pulmonary infarcts. He was started on IV heparin and admitted to step down.  Hypercoagulable work up ordered and 2 D echo ordered.  It showed Large thrombus burding in LV apex and distal septum The cavity size was severely dilated. Wall thickness was normal. The estimated ejection fraction was 15% Diffusehypokinesis. Doppler parameters are consistent with both elevated  ventricular end-diastolic filling pressure and elevated leftatrial filling pressure. Cardiology consulted for recommendations.   Assessment & Plan:   Principal Problem:   Pulmonary embolism with infarction [facility]) Active Problems:   Hemoptysis   Cardiomyopathy (HCC)   Elevated troponin   Abnormal liver function tests   NSVT (nonsustained ventricular tachycardia) (HCC)   Frequent PVCs   Acute thrombus of left ventricle (HCC)   Multiple bilateral pulmonary infarcts with hemoptysis: - patient initially with tachycardia, tachypnea, chest pain, sob, admitted to stepdown - he was started on IV heparin, vital signs improving, transition to coumadin ( due to large left ventricle thrombus) - monitor hemoglobin , hemoptysis while on anticoagulation - hypercoagulable work up in progress.  homocysteine level is 9.7, anti thrombin activity is low at 59. Protein C and S levels are low.  Anti cardiolipid and B2 glycoprotein levels are minimal and negative. Factor 5 leiden, prothrombin gene mutation pending Bilateral venous doppler ordered Patient reported trip to [place] in [place], but his symptoms dated back to September -cardiology and critical care input appreciated, will also consult hematology   Left  Ventricular apex thrombus:  Started on IV heparin, transition to coumadin on 11/22 Cardiology following  Acute right lower extremity DVT On anticoagulation  Cardiomyopathy of unclear etiology: EF 15% Betablocker started on 11/22, may need to repeat echo prior to discharge and may need life vest , will follow cardiology recommendations  Elevated troponins from right heart strain.   Elevated liver function test on admission: possibly from liver congestion from right heart strain.       DVT prophylaxis: heparin. Then coumadin Code Status: (Full) Family Communication: significant other at bedside.  Disposition Plan: transfer out of stepdown to med tele on 11/23, awaiting INR to be close to 2.5, will likely need repeat echo early next week to decide on the need of life vest   Consultants:   PCCM  Cardiology.   hematology   Procedures:   CT angiogram  Echocardiogram.   Venous duplex.   Antimicrobials: none.   Subjective:  Patient is seen with his significant other in room He report Feeling  Better,  Less chest pain, less hemoptysis, on room air, denies sob at rest , no dizziness upon standing     Objective: Vitals:   06/20/16 0015 06/20/16 0400 06/20/16 0417 06/20/16 0600  BP: 110/86 97/83    Pulse: (!) 51 98  95  Resp: (!) 24 20  (!) 28  Temp:   97.6 F (36.4 C)   TempSrc:   Oral   SpO2: 96% 100%  96%  Weight:  128.4 kg (283 lb 1.1 oz)    Height:  6\' 7"  (2.007 m)      Intake/Output Summary (Last 24 hours) at 06/20/16 0754 Last data filed at 06/20/16 0700  Gross per 24 hour  Intake          1296.88 ml  Output             1425 ml  Net          -128.12 ml   Filed Weights   06/17/16 0349 06/19/16 0436 06/20/16 0400  Weight: 95.3 kg (210 lb) 125.8 kg (277 lb 5.4 oz) 128.4 kg (283 lb 1.1 oz)    Examination:  General exam: Appears calm and comfortable on i lit of [place] oxygen.  Respiratory system: Clear to auscultation. Respiratory effort  normal. Cardiovascular system: S1 & S2 heard, RRR. No JVD, No pedal edema. Gastrointestinal system: Abdomen is nondistended, soft and nontender. No organomegaly or masses felt. Normal bowel sounds heard. Central nervous system: Alert and oriented. No focal neurological deficits. Extremities: Symmetric 5 x 5 power. Trace pitting edema bilateral lower extremity Skin: No rashes, lesions or ulcers Psychiatry: Judgement and insight appear normal. Mood & affect appropriate.     Data Reviewed: I have personally reviewed following labs and imaging studies  CBC:  Recent Labs Lab 06/17/16 0401 06/18/16 0150 06/19/16 0308 06/20/16 0316  WBC 8.5 7.5 6.5 6.7  HGB 15.9 14.4 14.0 13.9  HCT 46.0 43.0 42.4 41.3  MCV 85.5 87.0 88.3 86.2  PLT 187 176 205 216   Basic Metabolic Panel:  Recent Labs Lab 06/17/16 0401 06/18/16 0858 06/19/16 0308 06/20/16 0316  NA 134* 135 135 136  K 4.3 4.9 5.2* 4.1  CL 105 104 101 102  CO2 20* 25 28 28   GLUCOSE 179* 136* 133* 122*  BUN 13 14 20 17   CREATININE 0.95 0.94 1.17 0.93  CALCIUM 8.2* 8.2* 8.3* 8.0*  MG  --  2.0  --   --    GFR: Estimated Creatinine Clearance: 162.3 mL/min (by C-G formula based on SCr of 0.93 mg/dL). Liver Function Tests:  Recent Labs Lab 06/17/16 0401 06/18/16 0858 06/20/16 0316  AST 87* 28 47*  ALT 228* 24 30  ALKPHOS 115 53 61  BILITOT 2.8* 1.5* 1.5*  PROT 6.4* 6.5 6.1*  ALBUMIN 3.1* 2.6* 2.3*   No results for input(s): LIPASE, AMYLASE in the last 168 hours. No results for input(s): AMMONIA in the last 168 hours. Coagulation Profile:  Recent Labs Lab 06/17/16 0546 06/20/16 0316  INR 1.79 1.44   Cardiac Enzymes:  Recent Labs Lab 06/17/16 2031 06/18/16 0150 06/18/16 0858  TROPONINI 0.04* 0.04* 0.04*   BNP (last 3 results) No results for input(s): PROBNP in the last 8760 hours. HbA1C: No results for input(s): HGBA1C in the last 72 hours. CBG: No results for input(s): GLUCAP in the last 168  hours. Lipid Profile: No results for input(s): CHOL, HDL, LDLCALC, TRIG, CHOLHDL, LDLDIRECT in the last 72 hours. Thyroid Function Tests:  Recent Labs  06/20/16 0316  TSH 3.183   Anemia Panel: No results for input(s): VITAMINB12, FOLATE, FERRITIN, TIBC, IRON, RETICCTPCT in the last 72 hours. Sepsis Labs: No results for input(s): PROCALCITON, LATICACIDVEN in the last 168 hours.  Recent Results (from the past 240 hour(s))  MRSA PCR Screening     Status: None   Collection Time: 06/17/16  6:34 AM  Result Value Ref Range Status   MRSA by PCR NEGATIVE NEGATIVE Final    Comment:        The GeneXpert MRSA Assay (FDA approved for NASAL specimens only), is one component of a comprehensive MRSA colonization surveillance program. It is not intended to diagnose MRSA infection nor to guide or  monitor treatment for MRSA infections.          Radiology Studies: No results found.      Scheduled Meds: . metoprolol succinate  12.5 mg Oral Daily  . sodium chloride flush  3 mL Intravenous Q12H  . warfarin  5 mg Oral ONCE-1800  . Warfarin - Pharmacist Dosing Inpatient   Does not apply q1800   Continuous Infusions: . heparin 2,350 Units/hr (06/20/16 0700)     LOS: 3 days    Time spent: 35 minutes.     Albertine GratesXu,Thora Scherman, MD PhD [facility] Pager [phone]   If 7PM-7AM, please contact night-coverage [email or web address] Password [email or web address] 06/20/2016, 7:54 AM

## 2016-06-20 NOTE — Progress Notes (Signed)
Subjective:  CP better. No SOB. Still experiencing hemoptysis   Objective:  Temp:  [97.6 F (36.4 C)-98.5 F (36.9 C)] 97.6 F (36.4 C) (11/23 0417) Pulse Rate:  [48-117] 95 (11/23 0600) Resp:  [16-33] 28 (11/23 0600) BP: (97-114)/(69-86) 97/83 (11/23 0400) SpO2:  [95 %-100 %] 96 % (11/23 0600) Weight:  [283 lb 1.1 oz (128.4 kg)] 283 lb 1.1 oz (128.4 kg) (11/23 0400) Weight change: 5 lb 11.7 oz (2.6 kg)  Intake/Output from previous day: 11/22 0701 - 11/23 0700 In: 1296.9 [P.O.:744; I.V.:552.9] Out: 1425 [Urine:1425]  Intake/Output from this shift: No intake/output data recorded.  Physical Exam: General appearance: alert and no distress Neck: no adenopathy, no carotid bruit, no JVD, supple, symmetrical, trachea midline and thyroid not enlarged, symmetric, no tenderness/mass/nodules Lungs: clear to auscultation bilaterally Heart: regular rate and rhythm, S1, S2 normal, no murmur, click, rub or gallop Extremities: extremities normal, atraumatic, no cyanosis or edema  Lab Results: Results for orders placed or performed during the hospital encounter of 06/17/16 (from the past 48 hour(s))  Troponin I (q 6hr x 3)     Status: Abnormal   Collection Time: 06/18/16  8:58 AM  Result Value Ref Range   Troponin I 0.04 (HH) <0.03 ng/mL    Comment: CRITICAL VALUE NOTED.  VALUE IS CONSISTENT WITH PREVIOUSLY REPORTED AND CALLED VALUE.  Heparin level (unfractionated)     Status: None   Collection Time: 06/18/16  8:58 AM  Result Value Ref Range   Heparin Unfractionated 0.35 0.30 - 0.70 IU/mL    Comment:        IF HEPARIN RESULTS ARE BELOW EXPECTED VALUES, AND PATIENT DOSAGE HAS BEEN CONFIRMED, SUGGEST FOLLOW UP TESTING OF ANTITHROMBIN III LEVELS.   Magnesium     Status: None   Collection Time: 06/18/16  8:58 AM  Result Value Ref Range   Magnesium 2.0 1.7 - 2.4 mg/dL  Comprehensive metabolic panel     Status: Abnormal   Collection Time: 06/18/16  8:58 AM  Result Value Ref  Range   Sodium 135 135 - 145 mmol/L   Potassium 4.9 3.5 - 5.1 mmol/L   Chloride 104 101 - 111 mmol/L   CO2 25 22 - 32 mmol/L   Glucose, Bld 136 (H) 65 - 99 mg/dL   BUN 14 6 - 20 mg/dL   Creatinine, Ser 0.94 0.61 - 1.24 mg/dL   Calcium 8.2 (L) 8.9 - 10.3 mg/dL   Total Protein 6.5 6.5 - 8.1 g/dL   Albumin 2.6 (L) 3.5 - 5.0 g/dL   AST 28 15 - 41 U/L   ALT 24 17 - 63 U/L   Alkaline Phosphatase 53 38 - 126 U/L   Total Bilirubin 1.5 (H) 0.3 - 1.2 mg/dL   GFR calc non Af Amer >60 >60 mL/min   GFR calc Af Amer >60 >60 mL/min    Comment: (NOTE) The eGFR has been calculated using the CKD EPI equation. This calculation has not been validated in all clinical situations. eGFR's persistently <60 mL/min signify possible Chronic Kidney Disease.    Anion gap 6 5 - 15  CBC     Status: None   Collection Time: 06/19/16  3:08 AM  Result Value Ref Range   WBC 6.5 4.0 - 10.5 K/uL   RBC 4.80 4.22 - 5.81 MIL/uL   Hemoglobin 14.0 13.0 - 17.0 g/dL   HCT 42.4 39.0 - 52.0 %   MCV 88.3 78.0 - 100.0 fL   MCH 29.2  26.0 - 34.0 pg   MCHC 33.0 30.0 - 36.0 g/dL   RDW 15.4 11.5 - 15.5 %   Platelets 205 150 - 400 K/uL  Heparin level (unfractionated)     Status: Abnormal   Collection Time: 06/19/16  3:08 AM  Result Value Ref Range   Heparin Unfractionated 0.24 (L) 0.30 - 0.70 IU/mL    Comment:        IF HEPARIN RESULTS ARE BELOW EXPECTED VALUES, AND PATIENT DOSAGE HAS BEEN CONFIRMED, SUGGEST FOLLOW UP TESTING OF ANTITHROMBIN III LEVELS.   Basic metabolic panel     Status: Abnormal   Collection Time: 06/19/16  3:08 AM  Result Value Ref Range   Sodium 135 135 - 145 mmol/L   Potassium 5.2 (H) 3.5 - 5.1 mmol/L   Chloride 101 101 - 111 mmol/L   CO2 28 22 - 32 mmol/L   Glucose, Bld 133 (H) 65 - 99 mg/dL   BUN 20 6 - 20 mg/dL   Creatinine, Ser 1.17 0.61 - 1.24 mg/dL   Calcium 8.3 (L) 8.9 - 10.3 mg/dL   GFR calc non Af Amer >60 >60 mL/min   GFR calc Af Amer >60 >60 mL/min    Comment: (NOTE) The eGFR  has been calculated using the CKD EPI equation. This calculation has not been validated in all clinical situations. eGFR's persistently <60 mL/min signify possible Chronic Kidney Disease.    Anion gap 6 5 - 15  Heparin level (unfractionated)     Status: Abnormal   Collection Time: 06/19/16  2:08 PM  Result Value Ref Range   Heparin Unfractionated 0.29 (L) 0.30 - 0.70 IU/mL    Comment:        IF HEPARIN RESULTS ARE BELOW EXPECTED VALUES, AND PATIENT DOSAGE HAS BEEN CONFIRMED, SUGGEST FOLLOW UP TESTING OF ANTITHROMBIN III LEVELS.   Heparin level (unfractionated)     Status: None   Collection Time: 06/19/16  9:48 PM  Result Value Ref Range   Heparin Unfractionated 0.36 0.30 - 0.70 IU/mL    Comment:        IF HEPARIN RESULTS ARE BELOW EXPECTED VALUES, AND PATIENT DOSAGE HAS BEEN CONFIRMED, SUGGEST FOLLOW UP TESTING OF ANTITHROMBIN III LEVELS.   CBC     Status: None   Collection Time: 06/20/16  3:16 AM  Result Value Ref Range   WBC 6.7 4.0 - 10.5 K/uL   RBC 4.79 4.22 - 5.81 MIL/uL   Hemoglobin 13.9 13.0 - 17.0 g/dL   HCT 41.3 39.0 - 52.0 %   MCV 86.2 78.0 - 100.0 fL   MCH 29.0 26.0 - 34.0 pg   MCHC 33.7 30.0 - 36.0 g/dL   RDW 15.5 11.5 - 15.5 %   Platelets 216 150 - 400 K/uL  Heparin level (unfractionated)     Status: None   Collection Time: 06/20/16  3:16 AM  Result Value Ref Range   Heparin Unfractionated 0.36 0.30 - 0.70 IU/mL    Comment:        IF HEPARIN RESULTS ARE BELOW EXPECTED VALUES, AND PATIENT DOSAGE HAS BEEN CONFIRMED, SUGGEST FOLLOW UP TESTING OF ANTITHROMBIN III LEVELS.   Comprehensive metabolic panel     Status: Abnormal   Collection Time: 06/20/16  3:16 AM  Result Value Ref Range   Sodium 136 135 - 145 mmol/L   Potassium 4.1 3.5 - 5.1 mmol/L    Comment: DELTA CHECK NOTED REPEATED TO VERIFY    Chloride 102 101 - 111 mmol/L   CO2 28  22 - 32 mmol/L   Glucose, Bld 122 (H) 65 - 99 mg/dL   BUN 17 6 - 20 mg/dL   Creatinine, Ser 0.93 0.61 - 1.24  mg/dL   Calcium 8.0 (L) 8.9 - 10.3 mg/dL   Total Protein 6.1 (L) 6.5 - 8.1 g/dL   Albumin 2.3 (L) 3.5 - 5.0 g/dL   AST 47 (H) 15 - 41 U/L   ALT 30 17 - 63 U/L   Alkaline Phosphatase 61 38 - 126 U/L   Total Bilirubin 1.5 (H) 0.3 - 1.2 mg/dL   GFR calc non Af Amer >60 >60 mL/min   GFR calc Af Amer >60 >60 mL/min    Comment: (NOTE) The eGFR has been calculated using the CKD EPI equation. This calculation has not been validated in all clinical situations. eGFR's persistently <60 mL/min signify possible Chronic Kidney Disease.    Anion gap 6 5 - 15  TSH     Status: None   Collection Time: 06/20/16  3:16 AM  Result Value Ref Range   TSH 3.183 0.350 - 4.500 uIU/mL    Comment: Performed by a 3rd Generation assay with a functional sensitivity of <=0.01 uIU/mL.  Protime-INR     Status: Abnormal   Collection Time: 06/20/16  3:16 AM  Result Value Ref Range   Prothrombin Time 17.6 (H) 11.4 - 15.2 seconds   INR 1.44     Imaging: Imaging results have been reviewed. Lower extremity venous duplex shows RLE PT to popliteal vein DVT\  Tele- NSR with PVCs  Assessment/Plan:   1. Principal Problem: 2.   Pulmonary embolism with infarction ([place]) 3. Active Problems: 4.   Hemoptysis 5.   Cardiomyopathy ([place]) 6.   Elevated troponin 7.   Abnormal liver function tests 8.   NSVT (nonsustained ventricular tachycardia) ([facility]) 9.   Frequent PVCs 10.   Acute thrombus of left ventricle ([facility]) 11.   Time Spent Directly with Patient:  20 minutes  Length of Stay:  LOS: 3 days   Pleuritic CP improving.  On IV hep---> coumadin per pharm. INR target 2.5-3.0. Still concerned about his hemoptysis most likely from pulmonary infarction. VSS. Would switch BB to low dose coreg and we can titrate. Start ACE-I and follow labs. Will need 2D echo on Monday to assess LV FXN. If still < 30-35% will need to consider LifeVest . Probably OK to Tx to a tele bed.   Quay Burow 06/20/2016, 7:49 AM

## 2016-06-21 ENCOUNTER — Inpatient Hospital Stay (HOSPITAL_COMMUNITY): Payer: Medicaid Other

## 2016-06-21 DIAGNOSIS — I428 Other cardiomyopathies: Secondary | ICD-10-CM

## 2016-06-21 LAB — CBC
HEMATOCRIT: 39.1 % (ref 39.0–52.0)
HEMOGLOBIN: 13.5 g/dL (ref 13.0–17.0)
MCH: 29.3 pg (ref 26.0–34.0)
MCHC: 34.5 g/dL (ref 30.0–36.0)
MCV: 85 fL (ref 78.0–100.0)
Platelets: 197 10*3/uL (ref 150–400)
RBC: 4.6 MIL/uL (ref 4.22–5.81)
RDW: 15.4 % (ref 11.5–15.5)
WBC: 5.6 10*3/uL (ref 4.0–10.5)

## 2016-06-21 LAB — COMPREHENSIVE METABOLIC PANEL
ALBUMIN: 2.1 g/dL — AB (ref 3.5–5.0)
ALT: 26 U/L (ref 17–63)
AST: 33 U/L (ref 15–41)
Alkaline Phosphatase: 63 U/L (ref 38–126)
Anion gap: 6 (ref 5–15)
BUN: 14 mg/dL (ref 6–20)
CHLORIDE: 103 mmol/L (ref 101–111)
CO2: 26 mmol/L (ref 22–32)
Calcium: 7.8 mg/dL — ABNORMAL LOW (ref 8.9–10.3)
Creatinine, Ser: 0.83 mg/dL (ref 0.61–1.24)
GFR calc Af Amer: >60 mL/min (ref >60)
GFR calc non Af Amer: >60 mL/min (ref >60)
GLUCOSE: 115 mg/dL — AB (ref 65–99)
POTASSIUM: 4 mmol/L (ref 3.5–5.1)
Sodium: 135 mmol/L (ref 135–145)
Total Bilirubin: 1.3 mg/dL — ABNORMAL HIGH (ref 0.3–1.2)
Total Protein: 5.8 g/dL — ABNORMAL LOW (ref 6.5–8.1)

## 2016-06-21 LAB — ECHOCARDIOGRAM LIMITED
FS: 5 % — AB (ref 28–44)
HEIGHTINCHES: 79 in
IVS/LV PW RATIO, ED: 0.76
LA diam end sys: 48 mm
LA diam index: 1.83 cm/m2
LASIZE: 48 mm
PW: 14 mm — AB (ref 0.6–1.1)
Reg peak vel: 215 cm/s
TR max vel: 215 cm/s
Weight: 4430.36 oz

## 2016-06-21 LAB — HEPATITIS PANEL, ACUTE
HCV Ab: <0.1 s/co ratio (ref 0.0–0.9)
HEP B C IGM: NEGATIVE
Hep A IgM: NEGATIVE
Hepatitis B Surface Ag: NEGATIVE

## 2016-06-21 LAB — PROTIME-INR
INR: 1.33
PROTHROMBIN TIME: 16.6 s — AB (ref 11.4–15.2)

## 2016-06-21 LAB — FACTOR 5 LEIDEN

## 2016-06-21 LAB — HEPARIN LEVEL (UNFRACTIONATED): Heparin Unfractionated: 0.41 IU/mL (ref 0.30–0.70)

## 2016-06-21 MED ORDER — GUAIFENESIN ER 600 MG PO TB12
600.0000 mg | ORAL_TABLET | Freq: Two times a day (BID) | ORAL | Status: DC
  Administered 2016-06-21 – 2016-06-26 (×12): 600 mg via ORAL
  Filled 2016-06-21 (×13): qty 1

## 2016-06-21 MED ORDER — LISINOPRIL 2.5 MG PO TABS
2.5000 mg | ORAL_TABLET | Freq: Every day | ORAL | Status: DC
  Administered 2016-06-22: 2.5 mg via ORAL
  Filled 2016-06-21: qty 1

## 2016-06-21 MED ORDER — WARFARIN SODIUM 5 MG PO TABS
7.5000 mg | ORAL_TABLET | Freq: Once | ORAL | Status: AC
  Administered 2016-06-21: 7.5 mg via ORAL
  Filled 2016-06-21: qty 1

## 2016-06-21 MED ORDER — BISACODYL 10 MG RE SUPP: 10.0000 mg | Freq: Every day | RECTAL | Status: DC

## 2016-06-21 MED ORDER — POLYETHYLENE GLYCOL 3350 17 G PO PACK
17.0000 g | PACK | Freq: Every day | ORAL | Status: DC
  Administered 2016-06-21: 17 g via ORAL
  Filled 2016-06-21 (×3): qty 1

## 2016-06-21 MED ORDER — CARVEDILOL 3.125 MG PO TABS
3.1250 mg | ORAL_TABLET | Freq: Two times a day (BID) | ORAL | Status: DC
  Administered 2016-06-21 – 2016-06-22 (×2): 3.125 mg via ORAL
  Filled 2016-06-21 (×2): qty 1

## 2016-06-21 MED ORDER — BISACODYL 10 MG RE SUPP
10.0000 mg | Freq: Every day | RECTAL | Status: DC | PRN
Start: 2016-06-21 — End: 2016-06-27

## 2016-06-21 MED ORDER — SENNOSIDES-DOCUSATE SODIUM 8.6-50 MG PO TABS
1.0000 | ORAL_TABLET | Freq: Two times a day (BID) | ORAL | Status: DC
  Administered 2016-06-21 – 2016-06-22 (×2): 1 via ORAL
  Filled 2016-06-21 (×5): qty 1

## 2016-06-21 NOTE — Progress Notes (Signed)
Subjective:  Feeling better. No CP/SOB. Hemoptysis improved. On IV hep---> coumadin AC  Objective:  Temp:  [97.7 F (36.5 C)-98.2 F (36.8 C)] 97.7 F (36.5 C) (11/24 0434) Pulse Rate:  [92-100] 98 (11/24 0434) Resp:  [20] 20 (11/23 2102) BP: (100-111)/(68-88) 111/72 (11/24 0434) SpO2:  [95 %-98 %] 98 % (11/24 0434) Weight:  [276 lb 14.4 oz (125.6 kg)] 276 lb 14.4 oz (125.6 kg) (11/24 0434) Weight change: -6 lb 2.8 oz (-2.8 kg)  Intake/Output from previous day: 11/23 0701 - 11/24 0700 In: 866.5 [P.O.:420; I.V.:446.5] Out: 1150 [Urine:1150]  Intake/Output from this shift: Total I/O In: 240 [P.O.:240] Out: 400 [Urine:400]  Physical Exam: General appearance: alert and no distress Neck: no adenopathy, no carotid bruit, no JVD, supple, symmetrical, trachea midline and thyroid not enlarged, symmetric, no tenderness/mass/nodules Lungs: clear to auscultation bilaterally Heart: regular rate and rhythm, S1, S2 normal, no murmur, click, rub or gallop Extremities: extremities normal, atraumatic, no cyanosis or edema  Lab Results: Results for orders placed or performed during the hospital encounter of 06/17/16 (from the past 48 hour(s))  Heparin level (unfractionated)     Status: Abnormal   Collection Time: 06/19/16  2:08 PM  Result Value Ref Range   Heparin Unfractionated 0.29 (L) 0.30 - 0.70 IU/mL    Comment:        IF HEPARIN RESULTS ARE BELOW EXPECTED VALUES, AND PATIENT DOSAGE HAS BEEN CONFIRMED, SUGGEST FOLLOW UP TESTING OF ANTITHROMBIN III LEVELS.   Heparin level (unfractionated)     Status: None   Collection Time: 06/19/16  9:48 PM  Result Value Ref Range   Heparin Unfractionated 0.36 0.30 - 0.70 IU/mL    Comment:        IF HEPARIN RESULTS ARE BELOW EXPECTED VALUES, AND PATIENT DOSAGE HAS BEEN CONFIRMED, SUGGEST FOLLOW UP TESTING OF ANTITHROMBIN III LEVELS.   CBC     Status: None   Collection Time: 06/20/16  3:16 AM  Result Value Ref Range   WBC 6.7 4.0  - 10.5 K/uL   RBC 4.79 4.22 - 5.81 MIL/uL   Hemoglobin 13.9 13.0 - 17.0 g/dL   HCT 41.3 39.0 - 52.0 %   MCV 86.2 78.0 - 100.0 fL   MCH 29.0 26.0 - 34.0 pg   MCHC 33.7 30.0 - 36.0 g/dL   RDW 15.5 11.5 - 15.5 %   Platelets 216 150 - 400 K/uL  Heparin level (unfractionated)     Status: None   Collection Time: 06/20/16  3:16 AM  Result Value Ref Range   Heparin Unfractionated 0.36 0.30 - 0.70 IU/mL    Comment:        IF HEPARIN RESULTS ARE BELOW EXPECTED VALUES, AND PATIENT DOSAGE HAS BEEN CONFIRMED, SUGGEST FOLLOW UP TESTING OF ANTITHROMBIN III LEVELS.   Comprehensive metabolic panel     Status: Abnormal   Collection Time: 06/20/16  3:16 AM  Result Value Ref Range   Sodium 136 135 - 145 mmol/L   Potassium 4.1 3.5 - 5.1 mmol/L    Comment: DELTA CHECK NOTED REPEATED TO VERIFY    Chloride 102 101 - 111 mmol/L   CO2 28 22 - 32 mmol/L   Glucose, Bld 122 (H) 65 - 99 mg/dL   BUN 17 6 - 20 mg/dL   Creatinine, Ser 0.93 0.61 - 1.24 mg/dL   Calcium 8.0 (L) 8.9 - 10.3 mg/dL   Total Protein 6.1 (L) 6.5 - 8.1 g/dL   Albumin 2.3 (L) 3.5 - 5.0  g/dL   AST 47 (H) 15 - 41 U/L   ALT 30 17 - 63 U/L   Alkaline Phosphatase 61 38 - 126 U/L   Total Bilirubin 1.5 (H) 0.3 - 1.2 mg/dL   GFR calc non Af Amer >60 >60 mL/min   GFR calc Af Amer >60 >60 mL/min    Comment: (NOTE) The eGFR has been calculated using the CKD EPI equation. This calculation has not been validated in all clinical situations. eGFR's persistently <60 mL/min signify possible Chronic Kidney Disease.    Anion gap 6 5 - 15  TSH     Status: None   Collection Time: 06/20/16  3:16 AM  Result Value Ref Range   TSH 3.183 0.350 - 4.500 uIU/mL    Comment: Performed by a 3rd Generation assay with a functional sensitivity of <=0.01 uIU/mL.  T4, free     Status: None   Collection Time: 06/20/16  3:16 AM  Result Value Ref Range   Free T4 0.86 0.61 - 1.12 ng/dL    Comment: (NOTE) Biotin ingestion may interfere with free T4 tests. If  the results are inconsistent with the TSH level, previous test results, or the clinical presentation, then consider biotin interference. If needed, order repeat testing after stopping biotin. Performed at [facility]   Lipid panel     Status: Abnormal   Collection Time: 06/20/16  3:16 AM  Result Value Ref Range   Cholesterol 81 0 - 200 mg/dL   Triglycerides 67 <150 mg/dL   HDL 14 (L) >40 mg/dL   Total CHOL/HDL Ratio 5.8 RATIO   VLDL 13 0 - 40 mg/dL   LDL Cholesterol 54 0 - 99 mg/dL    Comment:        Total Cholesterol/HDL:CHD Risk Coronary Heart Disease Risk Table                     Men   Women  1/2 Average Risk   3.4   3.3  Average Risk       5.0   4.4  2 X Average Risk   9.6   7.1  3 X Average Risk  23.4   11.0        Use the calculated Patient Ratio above and the CHD Risk Table to determine the patient's CHD Risk.        ATP III CLASSIFICATION (LDL):  <100     mg/dL   Optimal  100-129  mg/dL   Near or Above                    Optimal  130-159  mg/dL   Borderline  160-189  mg/dL   High  >190     mg/dL   Very High Performed at [facility]     Status: Abnormal   Collection Time: 06/20/16  3:16 AM  Result Value Ref Range   Prothrombin Time 17.6 (H) 11.4 - 15.2 seconds   INR 1.44   CBC     Status: None   Collection Time: 06/21/16  4:12 AM  Result Value Ref Range   WBC 5.6 4.0 - 10.5 K/uL   RBC 4.60 4.22 - 5.81 MIL/uL   Hemoglobin 13.5 13.0 - 17.0 g/dL   HCT 39.1 39.0 - 52.0 %   MCV 85.0 78.0 - 100.0 fL   MCH 29.3 26.0 - 34.0 pg   MCHC 34.5 30.0 - 36.0 g/dL   RDW 15.4  11.5 - 15.5 %   Platelets 197 150 - 400 K/uL  Heparin level (unfractionated)     Status: None   Collection Time: 06/21/16  4:12 AM  Result Value Ref Range   Heparin Unfractionated 0.41 0.30 - 0.70 IU/mL    Comment:        IF HEPARIN RESULTS ARE BELOW EXPECTED VALUES, AND PATIENT DOSAGE HAS BEEN CONFIRMED, SUGGEST FOLLOW UP TESTING OF ANTITHROMBIN III LEVELS.     Protime-INR     Status: Abnormal   Collection Time: 06/21/16  4:12 AM  Result Value Ref Range   Prothrombin Time 16.6 (H) 11.4 - 15.2 seconds   INR 1.33   Comprehensive metabolic panel     Status: Abnormal   Collection Time: 06/21/16  4:12 AM  Result Value Ref Range   Sodium 135 135 - 145 mmol/L   Potassium 4.0 3.5 - 5.1 mmol/L   Chloride 103 101 - 111 mmol/L   CO2 26 22 - 32 mmol/L   Glucose, Bld 115 (H) 65 - 99 mg/dL   BUN 14 6 - 20 mg/dL   Creatinine, Ser 0.83 0.61 - 1.24 mg/dL   Calcium 7.8 (L) 8.9 - 10.3 mg/dL   Total Protein 5.8 (L) 6.5 - 8.1 g/dL   Albumin 2.1 (L) 3.5 - 5.0 g/dL   AST 33 15 - 41 U/L   ALT 26 17 - 63 U/L   Alkaline Phosphatase 63 38 - 126 U/L   Total Bilirubin 1.3 (H) 0.3 - 1.2 mg/dL   GFR calc non Af Amer >60 >60 mL/min   GFR calc Af Amer >60 >60 mL/min    Comment: (NOTE) The eGFR has been calculated using the CKD EPI equation. This calculation has not been validated in all clinical situations. eGFR's persistently <60 mL/min signify possible Chronic Kidney Disease.    Anion gap 6 5 - 15    Imaging: Imaging results have been reviewed  Tele- NSR  Assessment/Plan:   1. Principal Problem: 2.   Pulmonary embolism with infarction ([place]) 3. Active Problems: 4.   Hemoptysis 5.   Cardiomyopathy ([place]) 6.   Elevated troponin 7.   Abnormal liver function tests 8.   NSVT (nonsustained ventricular tachycardia) ([facility]) 9.   Frequent PVCs 10.   Acute thrombus of left ventricle ([facility]) 11.   Time Spent Directly with Patient:  15 minutes  Length of Stay:  LOS: 4 days   Feeling better. IV hep---> coumadin AC. Pharm dosing. INR 1.33. SR/ST. Can change Metop to coreg and we can titrate as op. Start low dose ACE-I. Re check 2D on Monday. If EF <30% consider LifeVest. We will follow over the weekend.   Quay Burow 06/21/2016, 10:53 AM

## 2016-06-21 NOTE — Progress Notes (Addendum)
ANTICOAGULATION CONSULT NOTE  Pharmacy Consult for Heparin, Warfarin Indication: multiple bilateral pulmonary embolism, acute thrombus of left ventricle, RLE DVT  No Known Allergies  Patient Measurements: Height: 6\' 7"  (200.7 cm) Weight: 276 lb 14.4 oz (125.6 kg) IBW/kg (Calculated) : 93.7 Heparin Dosing Weight: actual body weight  Vital Signs: Temp: 97.7 F (36.5 C) (11/24 0434) Temp Source: Oral (11/24 0434) BP: 111/72 (11/24 0434) Pulse Rate: 98 (11/24 0434)  Labs:  Recent Labs  06/19/16 0308  06/19/16 2148 06/20/16 0316 06/21/16 0412  HGB 14.0  --   --  13.9 13.5  HCT 42.4  --   --  41.3 39.1  PLT 205  --   --  216 197  LABPROT  --   --   --  17.6* 16.6*  INR  --   --   --  1.44 1.33  HEPARINUNFRC 0.24*  < > 0.36 0.36 0.41  CREATININE 1.17  --   --  0.93 0.83  < > = values in this interval not displayed.  Estimated Creatinine Clearance: 180 mL/min (by C-G formula based on SCr of 0.83 mg/dL).   Medical History: History reviewed. No pertinent past medical history.  Medications:  No meds PTA  Assessment: 40 yr male with no significant PMH presents with complaint of shortness of breath and chest pain.  Recently completed course of Zpack with no resolution of symptoms.  Reports hemoptysis which had been worsening. Pharmacy asked to assist with dosing of heparin infusion on admission. Warfarin added on 11/22.  Significant events:  11/19: CTAngio shows bilateral pulmonary embolism with possible right heart strain  11/20: Echo shows left ventricular apex thrombus  11/22: Doppler + for RLE DVT  Today, 06/21/2016:  CBC: WNL, stable.  Patient reports continued hemoptysis, but improved.  Heparin level at 0412 = 0.41 units/mL, therapeutic on heparin infusion at 2350 units/hr  INR = 1.33, decreased after 2 doses of warfarin 5mg   CrCl: > 100 ml/min  No significant drug-drug interactions  Goal of Therapy: Heparin level 0.3-0.7 units/ml Monitor platelets by  anticoagulation protocol: Yes INR 2.5-3 (per Cardiology recommendations)  Plan:  Continue IV heparin infusion at 2350 units/hr  Warfarin 7.5mg  PO x 1 at 1200  Daily PT/INR, heparin level, CBC  Monitor closely for bleeding, hemoptysis   Greer Pickerel, PharmD, BCPS Pager: [phone] 06/21/2016 10:53 AM

## 2016-06-21 NOTE — Progress Notes (Signed)
  Echocardiogram 2D Echocardiogram limited has been performed.  Richard [name] 06/21/2016, 4:33 PM

## 2016-06-21 NOTE — Progress Notes (Signed)
Notified by CCMD that patient had 15 second run of VTach. Pt resting comfortably, vital signs obtained and stable. Richard Poag, NP made aware. Will continue to monitor patient.

## 2016-06-21 NOTE — Progress Notes (Signed)
PROGRESS NOTE    Richard [name]  JME:[id number] DOB: Jul 12, 1976 DOA: 06/17/2016 PCP: No primary care provider on file.    Brief Narrative: Richard [name] a 40 y.o.malewith no significant medical history, presents to the ED with c/o SOB and chest pain. He was found to have multifocal pulmonary embolism, RV strain and pulmonary infarcts. He was started on IV heparin and admitted to step down.  Hypercoagulable work up ordered and 2 D echo ordered.  It showed Large thrombus burding in LV apex and distal septum The cavity size was severely dilated. Wall thickness was normal. The estimated ejection fraction was 15% Diffusehypokinesis. Doppler parameters are consistent with both elevated  ventricular end-diastolic filling pressure and elevated leftatrial filling pressure. Cardiology consulted for recommendations.   Assessment & Plan:   Principal Problem:   Pulmonary embolism with infarction [facility]) Active Problems:   Hemoptysis   Cardiomyopathy (HCC)   Elevated troponin   Abnormal liver function tests   NSVT (nonsustained ventricular tachycardia) (HCC)   Frequent PVCs   Acute thrombus of left ventricle (HCC)   Multiple bilateral pulmonary infarcts with hemoptysis: - patient initially with tachycardia, tachypnea, chest pain, sob, admitted to stepdown - he was started on IV heparin, vital signs improving, transition to coumadin ( due to large left ventricle thrombus) - monitor hemoglobin , hemoptysis while on anticoagulation - hypercoagulable work up in progress.  homocysteine level is 9.7, anti thrombin activity is low at 59. Protein C and S levels are low.  Anti cardiolipid and B2 glycoprotein levels are minimal and negative. Factor 5 leiden, prothrombin gene mutation pending Bilateral venous doppler ordered Patient reported trip to [place] in [place], but his symptoms dated back to September -cardiology, critical care, hematology  input appreciated   Left Ventricular apex  thrombus:  Started on IV heparin, transition to coumadin on 11/22 Cardiology following  Acute right lower extremity DVT On anticoagulation  Cardiomyopathy of unclear etiology: EF 15% Betablocker started on 11/22, cardiology recommended coreg and lisinopril, will monitor bp and k level may need to repeat echo prior to discharge and may need life vest , will follow cardiology recommendations  Elevated troponins from right heart strain.  NSVT: keep k>4, mag >2. tsh wnl On betablocker Cardiology following   Elevated liver function : possibly from liver congestion from right heart strain. Hepatitis panel negative. No n/v, no abdominal pain,  lft improving   Constipation: stool softener   DVT prophylaxis: heparin. Then coumadin Code Status: (Full) Family Communication: patient Disposition Plan: transferred out of stepdown to med tele on 11/23, awaiting INR to be close to 2.5, will likely need repeat echo early next week to decide on the need of life vest   Consultants:   PCCM  Cardiology.   hematology   Procedures:   CT angiogram  Echocardiogram.   Venous duplex.   Antimicrobials: none.   Subjective:   He report Feeling  Better,  Less chest pain, less hemoptysis, on room air, denies sob at rest , no dizziness upon standing Report lower extremity edema is improving Report being constipated Had NSVT at 2:40 am    Objective: Vitals:   06/20/16 1110 06/20/16 2102 06/21/16 0245 06/21/16 0434  BP: 100/76 106/88 106/68 111/72  Pulse: 100 92 100 98  Resp: 20 20    Temp: 97.9 F (36.6 C) 98.2 F (36.8 C)  97.7 F (36.5 C)  TempSrc: Oral Oral  Oral  SpO2: 98% 95%  98%  Weight:    125.6 kg (276 lb  14.4 oz)  Height:        Intake/Output Summary (Last 24 hours) at 06/21/16 0852 Last data filed at 06/21/16 0600  Gross per 24 hour  Intake              843 ml  Output             1150 ml  Net             -307 ml   Filed Weights   06/19/16 0436 06/20/16 0400  06/21/16 0434  Weight: 125.8 kg (277 lb 5.4 oz) 128.4 kg (283 lb 1.1 oz) 125.6 kg (276 lb 14.4 oz)    Examination:  General exam: Appears calm and comfortable on i lit of [place] oxygen.  Respiratory system: Clear to auscultation. Respiratory effort normal. Cardiovascular system: S1 & S2 heard, RRR. No JVD, No pedal edema. Gastrointestinal system: Abdomen is nondistended, soft and nontender. No organomegaly or masses felt. Normal bowel sounds heard. Central nervous system: Alert and oriented. No focal neurological deficits. Extremities: Symmetric 5 x 5 power. Trace pitting edema bilateral lower extremity Skin: No rashes, lesions or ulcers Psychiatry: Judgement and insight appear normal. Mood & affect appropriate.     Data Reviewed: I have personally reviewed following labs and imaging studies  CBC:  Recent Labs Lab 06/17/16 0401 06/18/16 0150 06/19/16 0308 06/20/16 0316 06/21/16 0412  WBC 8.5 7.5 6.5 6.7 5.6  HGB 15.9 14.4 14.0 13.9 13.5  HCT 46.0 43.0 42.4 41.3 39.1  MCV 85.5 87.0 88.3 86.2 85.0  PLT 187 176 205 216 197   Basic Metabolic Panel:  Recent Labs Lab 06/17/16 0401 06/18/16 0858 06/19/16 0308 06/20/16 0316  NA 134* 135 135 136  K 4.3 4.9 5.2* 4.1  CL 105 104 101 102  CO2 20* 25 28 28   GLUCOSE 179* 136* 133* 122*  BUN 13 14 20 17   CREATININE 0.95 0.94 1.17 0.93  CALCIUM 8.2* 8.2* 8.3* 8.0*  MG  --  2.0  --   --    GFR: Estimated Creatinine Clearance: 160.6 mL/min (by C-G formula based on SCr of 0.93 mg/dL). Liver Function Tests:  Recent Labs Lab 06/17/16 0401 06/18/16 0858 06/20/16 0316  AST 87* 28 47*  ALT 228* 24 30  ALKPHOS 115 53 61  BILITOT 2.8* 1.5* 1.5*  PROT 6.4* 6.5 6.1*  ALBUMIN 3.1* 2.6* 2.3*   No results for input(s): LIPASE, AMYLASE in the last 168 hours. No results for input(s): AMMONIA in the last 168 hours. Coagulation Profile:  Recent Labs Lab 06/17/16 0546 06/20/16 0316 06/21/16 0412  INR 1.79 1.44 1.33   Cardiac  Enzymes:  Recent Labs Lab 06/17/16 2031 06/18/16 0150 06/18/16 0858  TROPONINI 0.04* 0.04* 0.04*   BNP (last 3 results) No results for input(s): PROBNP in the last 8760 hours. HbA1C: No results for input(s): HGBA1C in the last 72 hours. CBG: No results for input(s): GLUCAP in the last 168 hours. Lipid Profile:  Recent Labs  06/20/16 0316  CHOL 81  HDL 14*  LDLCALC 54  TRIG 67  CHOLHDL 5.8   Thyroid Function Tests:  Recent Labs  06/20/16 0316  TSH 3.183  FREET4 0.86   Anemia Panel: No results for input(s): VITAMINB12, FOLATE, FERRITIN, TIBC, IRON, RETICCTPCT in the last 72 hours. Sepsis Labs: No results for input(s): PROCALCITON, LATICACIDVEN in the last 168 hours.  Recent Results (from the past 240 hour(s))  MRSA PCR Screening     Status: None   Collection Time:  06/17/16  6:34 AM  Result Value Ref Range Status   MRSA by PCR NEGATIVE NEGATIVE Final    Comment:        The GeneXpert MRSA Assay (FDA approved for NASAL specimens only), is one component of a comprehensive MRSA colonization surveillance program. It is not intended to diagnose MRSA infection nor to guide or monitor treatment for MRSA infections.          Radiology Studies: No results found.      Scheduled Meds: . metoprolol succinate  12.5 mg Oral Daily  . sodium chloride flush  3 mL Intravenous Q12H  . Warfarin - Pharmacist Dosing Inpatient   Does not apply q1800   Continuous Infusions: . heparin 2,350 Units/hr (06/20/16 2201)     LOS: 4 days    Time spent: 25 minutes.     Albertine Grates, MD PhD [facility] Pager [phone]   If 7PM-7AM, please contact night-coverage [email or web address] Password [email or web address] 06/21/2016, 8:52 AM

## 2016-06-22 DIAGNOSIS — I513 Intracardiac thrombosis, not elsewhere classified: Secondary | ICD-10-CM

## 2016-06-22 LAB — CBC
HCT: 38.7 % — ABNORMAL LOW (ref 39.0–52.0)
Hemoglobin: 13.4 g/dL (ref 13.0–17.0)
MCH: 29.1 pg (ref 26.0–34.0)
MCHC: 34.6 g/dL (ref 30.0–36.0)
MCV: 84.1 fL (ref 78.0–100.0)
PLATELETS: 230 10*3/uL (ref 150–400)
RBC: 4.6 MIL/uL (ref 4.22–5.81)
RDW: 15.5 % (ref 11.5–15.5)
WBC: 4.6 10*3/uL (ref 4.0–10.5)

## 2016-06-22 LAB — MAGNESIUM: MAGNESIUM: 1.7 mg/dL (ref 1.7–2.4)

## 2016-06-22 LAB — BASIC METABOLIC PANEL
ANION GAP: 5 (ref 5–15)
BUN: 10 mg/dL (ref 6–20)
CO2: 26 mmol/L (ref 22–32)
Calcium: 7.8 mg/dL — ABNORMAL LOW (ref 8.9–10.3)
Chloride: 105 mmol/L (ref 101–111)
Creatinine, Ser: 0.76 mg/dL (ref 0.61–1.24)
GLUCOSE: 101 mg/dL — AB (ref 65–99)
POTASSIUM: 4.1 mmol/L (ref 3.5–5.1)
Sodium: 136 mmol/L (ref 135–145)

## 2016-06-22 LAB — HEPARIN LEVEL (UNFRACTIONATED): Heparin Unfractionated: 0.39 IU/mL (ref 0.30–0.70)

## 2016-06-22 LAB — PROTIME-INR
INR: 1.61
PROTHROMBIN TIME: 19.4 s — AB (ref 11.4–15.2)

## 2016-06-22 MED ORDER — WARFARIN SODIUM 5 MG PO TABS
7.5000 mg | ORAL_TABLET | Freq: Once | ORAL | Status: AC
  Administered 2016-06-22: 7.5 mg via ORAL
  Filled 2016-06-22: qty 1

## 2016-06-22 MED ORDER — MAGNESIUM SULFATE 2 GM/50ML IV SOLN
2.0000 g | Freq: Once | INTRAVENOUS | Status: AC
  Administered 2016-06-22: 2 g via INTRAVENOUS
  Filled 2016-06-22: qty 50

## 2016-06-22 MED ORDER — CARVEDILOL 6.25 MG PO TABS
6.2500 mg | ORAL_TABLET | Freq: Two times a day (BID) | ORAL | Status: DC
  Administered 2016-06-22 – 2016-06-25 (×5): 6.25 mg via ORAL
  Filled 2016-06-22 (×7): qty 1

## 2016-06-22 MED ORDER — IRBESARTAN 75 MG PO TABS
75.0000 mg | ORAL_TABLET | Freq: Every day | ORAL | Status: DC
Start: 2016-06-22 — End: 2016-06-27
  Administered 2016-06-22 – 2016-06-23 (×2): 75 mg via ORAL
  Filled 2016-06-22 (×4): qty 1

## 2016-06-22 NOTE — Progress Notes (Signed)
Spoke with hospitalist. Referred to Cardiology.  Spoke with  Cardiology in regards to 40 sec run of VT. Med change to occur. Cont with plan of care

## 2016-06-22 NOTE — Progress Notes (Signed)
Patient Name: Charlton HawsWilliam Durflinger Date of Encounter: 06/22/2016  Primary Cardiologist: new  Hospital Problem List     Principal Problem:   Pulmonary embolism with infarction [facility](HCC) Active Problems:   Hemoptysis   Cardiomyopathy (HCC)   Elevated troponin   Abnormal liver function tests   NSVT (nonsustained ventricular tachycardia) (HCC)   Frequent PVCs   Acute thrombus of left ventricle (HCC)     Subjective   Continues to cough, mildly hemoptoic sputum. Pleuritic pain has improved. No dyspnea at rest. No orthopnea.  Inpatient Medications    Scheduled Meds: . carvedilol  3.125 mg Oral BID WC  . guaiFENesin  600 mg Oral BID  . lisinopril  2.5 mg Oral Daily  . polyethylene glycol  17 g Oral Daily  . senna-docusate  1 tablet Oral BID  . sodium chloride flush  3 mL Intravenous Q12H  . warfarin  7.5 mg Oral ONCE-1800  . Warfarin - Pharmacist Dosing Inpatient   Does not apply q1800   Continuous Infusions: . heparin 2,350 Units/hr (06/22/16 0924)   PRN Meds: bisacodyl, HYDROmorphone (DILAUDID) injection, witch hazel-glycerin   Vital Signs    Vitals:   06/21/16 1350 06/21/16 2230 06/22/16 0500 06/22/16 0922  BP: 101/90 (!) 145/126 107/90 118/88  Pulse: (!) 51 98 92 96  Resp: 18 18 18    Temp: 97.9 F (36.6 C) 98.5 F (36.9 C) 97.6 F (36.4 C)   TempSrc: Oral Oral Oral   SpO2: 97% 100% 98%   Weight:   275 lb 2.2 oz (124.8 kg)   Height:        Intake/Output Summary (Last 24 hours) at 06/22/16 0958 Last data filed at 06/22/16 0924  Gross per 24 hour  Intake              898 ml  Output              500 ml  Net              398 ml   Filed Weights   06/20/16 0400 06/21/16 0434 06/22/16 0500  Weight: 283 lb 1.1 oz (128.4 kg) 276 lb 14.4 oz (125.6 kg) 275 lb 2.2 oz (124.8 kg)    Physical Exam   Very tall and muscular, mildly obese GEN: Well nourished, well developed, in no acute distress.  HEENT: Grossly normal.  Neck: Supple, no JVD, carotid bruits, or  masses. Cardiac: RRR, no murmurs, rubs, or gallops. No clubbing, cyanosis, edema.  Radials/DP/PT 2+ and equal bilaterally.  Respiratory:  Dullness to percussion and egophony L base. GI: Soft, nontender, nondistended, BS + x 4. MS: no deformity or atrophy. Skin: warm and dry, no rash. Neuro:  Strength and sensation are intact. Psych: AAOx3.  Normal affect.  Labs    CBC  Recent Labs  06/21/16 0412 06/22/16 0531  WBC 5.6 4.6  HGB 13.5 13.4  HCT 39.1 38.7*  MCV 85.0 84.1  PLT 197 230   Basic Metabolic Panel  Recent Labs  06/21/16 0412 06/22/16 0531  NA 135 136  K 4.0 4.1  CL 103 105  CO2 26 26  GLUCOSE 115* 101*  BUN 14 10  CREATININE 0.83 0.76  CALCIUM 7.8* 7.8*  MG  --  1.7   Liver Function Tests  Recent Labs  06/20/16 0316 06/21/16 0412  AST 47* 33  ALT 30 26  ALKPHOS 61 63  BILITOT 1.5* 1.3*  PROT 6.1* 5.8*  ALBUMIN 2.3* 2.1*   Fasting Lipid Panel  Recent Labs  06/20/16 0316  CHOL 81  HDL 14*  LDLCALC 54  TRIG 67  CHOLHDL 5.8   Thyroid Function Tests  Recent Labs  06/20/16 0316  TSH 3.183    Telemetry    NSR with PVCs/bigeminy/occasional 3-7 beat NSVT - Personally Reviewed  ECG    NSR, remarkable for low voltage; PRWP, lateral T wave inversion - Personally Reviewed  Radiology    No results found.  Cardiac Studies  Echo 06/21/16 - Left ventricle: The cavity size was moderately dilated. Wall   thickness was normal. Systolic function was severely reduced. The   estimated ejection fraction was in the range of 10% to 15%.   Severe diffuse hypokinesis with no identifiable regional   variations. There was a large, 2.0 cm (W), broad-based, layered,   fixedthrombus. It occupies the apex and extends for 4-5 cm   proximally along the septum and anterior wall. - Left atrium: The atrium was moderately dilated. - Right ventricle: The cavity size was moderately dilated. Systolic   function was moderately to severely reduced. - Right  atrium: The atrium was moderately dilated. - Pericardium, extracardiac: A trivial pericardial effusion was   identified.   Patient Profile     Previously healthy 40 yo with simultaneous diagnosis of large pulmonary embolism and severe dilated cardiomyopathy with biventricular involvement and very large LV thrombus  Assessment & Plan    1. 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  Note absence of calcium in coronaries or aorta on CT chest. The LCA and RCA have normal origins on CT chest. Note mediastinal lymphadenopathy.  Sarcoidosis should be considered. No skin lesions. Consider cardiac MRI as outpatient. Started on RAAS inhibitors and beta blockers. Prefer ARB to ACEi to allow transition to [facility] in the future. Discussed CHF symptoms, prognosis, sodium restriction, possible future need for advanced therapies including ICD transplantation or LVAD. No family history of sudden death or CHF, no personal history of syncope. Role of LifeVest not as clear in nonischemic CMP. Will need referral to CHF service at [facility]. 2. LV thrombus: at risk for systemic embolism/stroke. Warfarin started. May need lifelong anticoagulation unless there is an unexpected improvement in LV function. 3. DVT/PE: has evidence of residual thrombus in R calf.   Signed, Thurmon Fair, MD  06/22/2016, 9:58 AM

## 2016-06-22 NOTE — Progress Notes (Signed)
Pt had a run of VT. Nonsymptomatic.  VSS. EKG obtained and call to be placed to MD

## 2016-06-22 NOTE — Progress Notes (Signed)
PROGRESS NOTE    Richard HawsWilliam [name]  ZOX:[id number] DOB: [birth year] DOA: 06/17/2016 PCP: No primary care provider on file.    Brief Narrative: Richard [name] a 40 y.o.malewith no significant medical history, presents to the ED with c/o SOB and chest pain. He was found to have multifocal pulmonary embolism, RV strain and pulmonary infarcts. He was started on IV heparin and admitted to step down.  Hypercoagulable work up ordered and 2 D echo ordered.  It showed Large thrombus burding in LV apex and distal septum The cavity size was severely dilated. Wall thickness was normal. The estimated ejection fraction was 15% Diffusehypokinesis. Doppler parameters are consistent with both elevated  ventricular end-diastolic filling pressure and elevated leftatrial filling pressure. Cardiology consulted for recommendations.   Assessment & Plan:   Principal Problem:   Pulmonary embolism with infarction [facility](HCC) Active Problems:   Hemoptysis   Cardiomyopathy (HCC)   Elevated troponin   Abnormal liver function tests   NSVT (nonsustained ventricular tachycardia) (HCC)   Frequent PVCs   Acute thrombus of left ventricle (HCC)   Multiple bilateral pulmonary infarcts with hemoptysis: - patient initially with tachycardia, tachypnea, chest pain, sob, admitted to stepdown - he was started on IV heparin, vital signs improving, transition to coumadin ( due to large left ventricle thrombus) - hgb stable with ongoing hemoptysis while on anticoagulation - hypercoagulable work up in progress.  homocysteine level is 9.7, anti thrombin activity is low at 59. Protein C and S levels are low.  Anti cardiolipid and B2 glycoprotein levels are minimal and negative. Factor 5 leiden negative, prothrombin gene mutation pending Bilateral venous doppler ordered Patient reported trip to [place] in [place], but his symptoms dated back to September -cardiology, critical care, hematology  input appreciated   Left Ventricular  apex thrombus:  Started on IV heparin, transition to coumadin on 11/22 Cardiology following  Acute right lower extremity DVT On anticoagulation  Cardiomyopathy of unclear etiology: EF 15% Betablocker started on 11/22,  cardiology recommended coreg and ARB with the ease of transition to entresto, will monitor bp and k level Outpatient Cardiac mri to r/o sarcoidosis may need to repeat echo prior to discharge and may need life vest ,  will follow cardiology recommendations  Elevated troponins from right heart strain and cardiomyopathy  NSVT:  keep k>4, mag >2. tsh wnl On betablocker Cardiology following   Elevated liver function : possibly from liver congestion from right heart strain. Hepatitis panel negative. No n/v, no abdominal pain,  lft improving   Constipation: stool softener   DVT prophylaxis: heparin. Then coumadin Code Status: (Full) Family Communication: patient Disposition Plan: transferred out of stepdown to med tele on 11/23, awaiting INR to be close to 2.5, will likely need repeat echo early next week to decide on the need of life vest   Consultants:   PCCM  Cardiology.   hematology   Procedures:   CT angiogram  Echocardiogram.   Venous duplex.   Antimicrobials: none.   Subjective:   He report Feeling  Better,  Less chest pain, less hemoptysis, on room air, denies sob at rest , no dizziness upon standing Report lower extremity edema is improving Had bm Intermittent short run onf  NSVT     Objective: Vitals:   06/21/16 1350 06/21/16 2230 06/22/16 0500 06/22/16 0922  BP: 101/90 (!) 145/126 107/90 118/88  Pulse: (!) 51 98 92 96  Resp: 18 18 18    Temp: 97.9 F (36.6 C) 98.5 F (36.9 C) 97.6 F (36.4  C)   TempSrc: Oral Oral Oral   SpO2: 97% 100% 98%   Weight:   124.8 kg (275 lb 2.2 oz)   Height:        Intake/Output Summary (Last 24 hours) at 06/22/16 1050 Last data filed at 06/22/16 0924  Gross per 24 hour  Intake               898 ml  Output              500 ml  Net              398 ml   Filed Weights   06/20/16 0400 06/21/16 0434 06/22/16 0500  Weight: 128.4 kg (283 lb 1.1 oz) 125.6 kg (276 lb 14.4 oz) 124.8 kg (275 lb 2.2 oz)    Examination:  General exam: Appears calm and comfortable on i lit of [place] oxygen.  Respiratory system: Clear to auscultation. Respiratory effort normal. Cardiovascular system: S1 & S2 heard, RRR. No JVD, No pedal edema. Gastrointestinal system: Abdomen is nondistended, soft and nontender. No organomegaly or masses felt. Normal bowel sounds heard. Central nervous system: Alert and oriented. No focal neurological deficits. Extremities: Symmetric 5 x 5 power. Trace pitting edema bilateral lower extremity Skin: No rashes, lesions or ulcers Psychiatry: Judgement and insight appear normal. Mood & affect appropriate.     Data Reviewed: I have personally reviewed following labs and imaging studies  CBC:  Recent Labs Lab 06/18/16 0150 06/19/16 0308 06/20/16 0316 06/21/16 0412 06/22/16 0531  WBC 7.5 6.5 6.7 5.6 4.6  HGB 14.4 14.0 13.9 13.5 13.4  HCT 43.0 42.4 41.3 39.1 38.7*  MCV 87.0 88.3 86.2 85.0 84.1  PLT 176 205 216 197 230   Basic Metabolic Panel:  Recent Labs Lab 06/18/16 0858 06/19/16 0308 06/20/16 0316 06/21/16 0412 06/22/16 0531  NA 135 135 136 135 136  K 4.9 5.2* 4.1 4.0 4.1  CL 104 101 102 103 105  CO2 25 28 28 26 26   GLUCOSE 136* 133* 122* 115* 101*  BUN 14 20 17 14 10   CREATININE 0.94 1.17 0.93 0.83 0.76  CALCIUM 8.2* 8.3* 8.0* 7.8* 7.8*  MG 2.0  --   --   --  1.7   GFR: Estimated Creatinine Clearance: 186 mL/min (by C-G formula based on SCr of 0.76 mg/dL). Liver Function Tests:  Recent Labs Lab 06/17/16 0401 06/18/16 0858 06/20/16 0316 06/21/16 0412  AST 87* 28 47* 33  ALT 228* 24 30 26   ALKPHOS 115 53 61 63  BILITOT 2.8* 1.5* 1.5* 1.3*  PROT 6.4* 6.5 6.1* 5.8*  ALBUMIN 3.1* 2.6* 2.3* 2.1*   No results for input(s): LIPASE, AMYLASE in  the last 168 hours. No results for input(s): AMMONIA in the last 168 hours. Coagulation Profile:  Recent Labs Lab 06/17/16 0546 06/20/16 0316 06/21/16 0412 06/22/16 0531  INR 1.79 1.44 1.33 1.61   Cardiac Enzymes:  Recent Labs Lab 06/17/16 2031 06/18/16 0150 06/18/16 0858  TROPONINI 0.04* 0.04* 0.04*   BNP (last 3 results) No results for input(s): PROBNP in the last 8760 hours. HbA1C: No results for input(s): HGBA1C in the last 72 hours. CBG: No results for input(s): GLUCAP in the last 168 hours. Lipid Profile:  Recent Labs  06/20/16 0316  CHOL 81  HDL 14*  LDLCALC 54  TRIG 67  CHOLHDL 5.8   Thyroid Function Tests:  Recent Labs  06/20/16 0316  TSH 3.183  FREET4 0.86   Anemia Panel: No results for  input(s): VITAMINB12, FOLATE, FERRITIN, TIBC, IRON, RETICCTPCT in the last 72 hours. Sepsis Labs: No results for input(s): PROCALCITON, LATICACIDVEN in the last 168 hours.  Recent Results (from the past 240 hour(s))  MRSA PCR Screening     Status: None   Collection Time: 06/17/16  6:34 AM  Result Value Ref Range Status   MRSA by PCR NEGATIVE NEGATIVE Final    Comment:        The GeneXpert MRSA Assay (FDA approved for NASAL specimens only), is one component of a comprehensive MRSA colonization surveillance program. It is not intended to diagnose MRSA infection nor to guide or monitor treatment for MRSA infections.          Radiology Studies: No results found.      Scheduled Meds: . carvedilol  3.125 mg Oral BID WC  . guaiFENesin  600 mg Oral BID  . irbesartan  75 mg Oral Daily  . polyethylene glycol  17 g Oral Daily  . senna-docusate  1 tablet Oral BID  . sodium chloride flush  3 mL Intravenous Q12H  . warfarin  7.5 mg Oral ONCE-1800  . Warfarin - Pharmacist Dosing Inpatient   Does not apply q1800   Continuous Infusions: . heparin 2,350 Units/hr (06/22/16 0924)     LOS: 5 days    Time spent: 25 minutes.     Albertine Grates, MD  PhD [facility] Pager [phone]   If 7PM-7AM, please contact night-coverage [email or web address] Password [email or web address] 06/22/2016, 10:50 AM

## 2016-06-22 NOTE — Progress Notes (Signed)
ANTICOAGULATION CONSULT NOTE  Pharmacy Consult for Heparin, Warfarin Indication: multiple bilateral pulmonary embolism, acute thrombus of left ventricle, RLE DVT  No Known Allergies  Patient Measurements: Height: 6\' 7"  (200.7 cm) Weight: 275 lb 2.2 oz (124.8 kg) IBW/kg (Calculated) : 93.7 Heparin Dosing Weight: actual body weight  Vital Signs: Temp: 97.6 F (36.4 C) (11/25 0500) Temp Source: Oral (11/25 0500) BP: 107/90 (11/25 0500) Pulse Rate: 92 (11/25 0500)  Labs:  Recent Labs  06/20/16 0316 06/21/16 0412 06/22/16 0531  HGB 13.9 13.5 13.4  HCT 41.3 39.1 38.7*  PLT 216 197 230  LABPROT 17.6* 16.6* 19.4*  INR 1.44 1.33 1.61  HEPARINUNFRC 0.36 0.41 0.39  CREATININE 0.93 0.83 0.76    Estimated Creatinine Clearance: 186 mL/min (by C-G formula based on SCr of 0.76 mg/dL).   Medical History: History reviewed. No pertinent past medical history.  Medications:  No meds PTA  Assessment: 40 yr male with no significant PMH presents with complaint of shortness of breath and chest pain.  Recently completed course of Zpack with no resolution of symptoms.  Reports hemoptysis which had been worsening. Pharmacy asked to assist with dosing of heparin infusion on admission. Warfarin added on 11/22.  Significant events:  11/19: CTAngio shows bilateral pulmonary embolism with possible right heart strain  11/20: Echo shows left ventricular apex thrombus  11/22: Doppler + for RLE DVT  Today, 06/22/2016:  CBC: WNL, stable.  Per RN, patient has had no further hemoptysis.   Heparin level at 0531 = 0.39 units/mL, therapeutic on heparin infusion at 2350 units/hr  INR = 1.61, trending up  CrCl: > 100 ml/min  No significant drug-drug interactions  Goal of Therapy: Heparin level 0.3-0.7 units/ml Monitor platelets by anticoagulation protocol: Yes INR 2.5-3 (per Cardiology recommendations)  Plan:  Continue IV heparin infusion at 2350 units/hr  Warfarin 7.5mg  PO x 1 today    Daily PT/INR, heparin level, CBC  Monitor closely for bleeding, hemoptysis   Greer Pickerel, PharmD, BCPS Pager: [phone] 06/22/2016 8:59 AM

## 2016-06-23 LAB — MAGNESIUM: MAGNESIUM: 2 mg/dL (ref 1.7–2.4)

## 2016-06-23 LAB — COMPREHENSIVE METABOLIC PANEL
ALT: 23 U/L (ref 17–63)
AST: 26 U/L (ref 15–41)
Albumin: 2.3 g/dL — ABNORMAL LOW (ref 3.5–5.0)
Alkaline Phosphatase: 63 U/L (ref 38–126)
Anion gap: 6 (ref 5–15)
BILIRUBIN TOTAL: 1.3 mg/dL — AB (ref 0.3–1.2)
BUN: 11 mg/dL (ref 6–20)
CO2: 26 mmol/L (ref 22–32)
CREATININE: 0.84 mg/dL (ref 0.61–1.24)
Calcium: 8 mg/dL — ABNORMAL LOW (ref 8.9–10.3)
Chloride: 104 mmol/L (ref 101–111)
GFR calc Af Amer: >60 mL/min (ref >60)
Glucose, Bld: 109 mg/dL — ABNORMAL HIGH (ref 65–99)
POTASSIUM: 4.3 mmol/L (ref 3.5–5.1)
Sodium: 136 mmol/L (ref 135–145)
TOTAL PROTEIN: 5.8 g/dL — AB (ref 6.5–8.1)

## 2016-06-23 LAB — CBC
HEMATOCRIT: 40 % (ref 39.0–52.0)
Hemoglobin: 13.7 g/dL (ref 13.0–17.0)
MCH: 29 pg (ref 26.0–34.0)
MCHC: 34.3 g/dL (ref 30.0–36.0)
MCV: 84.6 fL (ref 78.0–100.0)
Platelets: 245 10*3/uL (ref 150–400)
RBC: 4.73 MIL/uL (ref 4.22–5.81)
RDW: 15.4 % (ref 11.5–15.5)
WBC: 5.1 10*3/uL (ref 4.0–10.5)

## 2016-06-23 LAB — HEPARIN LEVEL (UNFRACTIONATED): Heparin Unfractionated: 0.53 IU/mL (ref 0.30–0.70)

## 2016-06-23 LAB — PROTIME-INR
INR: 1.88
Prothrombin Time: 21.9 seconds — ABNORMAL HIGH (ref 11.4–15.2)

## 2016-06-23 MED ORDER — MAGNESIUM OXIDE 400 (241.3 MG) MG PO TABS
400.0000 mg | ORAL_TABLET | Freq: Every day | ORAL | Status: AC
  Administered 2016-06-24 – 2016-06-25 (×2): 400 mg via ORAL
  Filled 2016-06-23 (×2): qty 1

## 2016-06-23 MED ORDER — WARFARIN SODIUM 5 MG PO TABS
7.5000 mg | ORAL_TABLET | Freq: Once | ORAL | Status: AC
  Administered 2016-06-23: 7.5 mg via ORAL
  Filled 2016-06-23: qty 1

## 2016-06-23 NOTE — Progress Notes (Signed)
Patient Name: Richard [name] Date of Encounter: 06/23/2016  [facility]     Principal Problem:   Pulmonary embolism with infarction [facility](HCC) Active Problems:   Hemoptysis   Cardiomyopathy (HCC)   Elevated troponin   Abnormal liver function tests   NSVT (nonsustained ventricular tachycardia) (HCC)   Frequent PVCs   Acute thrombus of left ventricle (HCC)     Subjective   Feels OK, less cough, less hemoptysis and reduced pleuritic pain. No orthopnea.  Inpatient Medications    Scheduled Meds: . carvedilol  6.25 mg Oral BID WC  . guaiFENesin  600 mg Oral BID  . irbesartan  75 mg Oral Daily  . polyethylene glycol  17 g Oral Daily  . senna-docusate  1 tablet Oral BID  . sodium chloride flush  3 mL Intravenous Q12H  . Warfarin - Pharmacist Dosing Inpatient   Does not apply q1800   Continuous Infusions: . heparin 2,350 Units/hr (06/23/16 [id number])   PRN Meds: bisacodyl, HYDROmorphone (DILAUDID) injection, witch hazel-glycerin   Vital Signs    Vitals:   06/22/16 1307 06/22/16 1309 06/22/16 2103 06/23/16 0639  BP: 90/79 104/74 116/87 90/70  Pulse: 97  89 87  Resp:   18 18  Temp: 97.9 F (36.6 C)  98.1 F (36.7 C) 98 F (36.7 C)  TempSrc: Oral  Oral Oral  SpO2:   95% 99%  Weight:    283 lb 4.7 oz (128.5 kg)  Height:        Intake/Output Summary (Last 24 hours) at 06/23/16 1052 Last data filed at 06/23/16 0600  Gross per 24 hour  Intake              570 ml  Output                0 ml  Net              570 ml   Filed Weights   06/21/16 0434 06/22/16 0500 06/23/16 0639  Weight: 276 lb 14.4 oz (125.6 kg) 275 lb 2.2 oz (124.8 kg) 283 lb 4.7 oz (128.5 kg)    Physical Exam     Very tall and muscular, mildly obese GEN: Well nourished, well developed, in no acute distress.  HEENT: Grossly normal.  Neck: Supple, no JVD, carotid bruits, or masses. Cardiac: displaced apical impuulse, RRR, no murmurs, rubs, or gallops. No clubbing,  cyanosis, edema.  Radials/DP/PT 2+ and equal bilaterally.  Respiratory:  Dullness to percussion and egophony L base. GI: Soft, nontender, nondistended, BS + x 4. MS: no deformity or atrophy. Skin: warm and dry, no rash. Neuro:  Strength and sensation are intact. Psych: AAOx3.  Normal affect.  Labs    CBC  Recent Labs  06/22/16 0531 06/23/16 0538  WBC 4.6 5.1  HGB 13.4 13.7  HCT 38.7* 40.0  MCV 84.1 84.6  PLT 230 245   Basic Metabolic Panel  Recent Labs  06/22/16 0531 06/23/16 0538  NA 136 136  K 4.1 4.3  CL 105 104  CO2 26 26  GLUCOSE 101* 109*  BUN 10 11  CREATININE 0.76 0.84  CALCIUM 7.8* 8.0*  MG 1.7 2.0   Liver Function Tests  Recent Labs  06/21/16 0412 06/23/16 0538  AST 33 26  ALT 26 23  ALKPHOS 63 63  BILITOT 1.3* 1.3*  PROT 5.8* 5.8*  ALBUMIN 2.1* 2.3*     Telemetry    NSR with frequent PVCs. Had "slow VT"  101 bpm yesterday PM, asymptomatic. Had sinus brady down to 49 during sleep. - Personally Reviewed  Cardiac Studies   Echo 06/21/16 - Left ventricle: The cavity size was moderately dilated. Wall thickness was normal. Systolic function was severely reduced. The estimated ejection fraction was in the range of 10% to 15%. Severe diffuse hypokinesis with no identifiable regional variations. There was a large, 2.0 cm (W), broad-based, layered, fixedthrombus. It occupies the apex and extends for 4-5 cm proximally along the septum and anterior wall. - Left atrium: The atrium was moderately dilated. - Right ventricle: The cavity size was moderately dilated. Systolic function was moderately to severely reduced. - Right atrium: The atrium was moderately dilated. - Pericardium, extracardiac: A trivial pericardial effusion was identified.  Patient Profile     Previously healthy 40 yo with simultaneous diagnosis of large pulmonary embolism and severe dilated cardiomyopathy with biventricular involvement and very large LV  thrombus  Assessment & Plan    1. 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  Note absence of calcium in coronaries or aorta on CT chest. The LCA and RCA have normal origins on CT chest. Note mediastinal lymphadenopathy.  Sarcoidosis and hemochromatosis should be considered. Consider cardiac MRI as outpatient. Premature to order MRI due to inability to hold breath. Started on RAAS inhibitors and beta blockers. Prefer ARB to ACEi to allow transition to [facility] in the future. BP relatively low (related to PE as well?), do not plan additional titration today. Discussed CHF symptoms, prognosis, sodium restriction, possible future need for advanced therapies including ICD transplantation or LVAD. No family history of sudden death or CHF, no personal history of syncope. Role of LifeVest not as clear in nonischemic CMP. Will need referral to CHF service at DC. 2. LV thrombus: at risk for systemic embolism/stroke. Warfarin started. INR 1.88. Discussed warfarin food and drug interactions and need for monitoring. Probably needs lifelong anticoagulation, unless there is an unexpected improvement in LV function. 3. VT: very slow and asymptomatic. I think it is premature to increase the beta blocker at this time. 4. DVT/PE: has evidence of residual thrombus in R calf. Symptoms of PE preceded his plane trip to [facility], which may have worsened the thrombotic process.  Signed, Thurmon Fair, MD  06/23/2016, 10:52 AM

## 2016-06-23 NOTE — Progress Notes (Signed)
ANTICOAGULATION CONSULT NOTE  Pharmacy Consult for Heparin, Warfarin Indication: multiple bilateral pulmonary embolism, acute thrombus of left ventricle, RLE DVT  No Known Allergies  Patient Measurements: Height: 6\' 7"  (200.7 cm) Weight: 283 lb 4.7 oz (128.5 kg) IBW/kg (Calculated) : 93.7 Heparin Dosing Weight: actual body weight  Vital Signs: Temp: 98.2 F (36.8 C) (11/26 1357) Temp Source: Oral (11/26 1357) BP: 109/66 (11/26 1357) Pulse Rate: 90 (11/26 1357)  Labs:  Recent Labs  06/21/16 0412 06/22/16 0531 06/23/16 0538  HGB 13.5 13.4 13.7  HCT 39.1 38.7* 40.0  PLT 197 230 245  LABPROT 16.6* 19.4* 21.9*  INR 1.33 1.61 1.88  HEPARINUNFRC 0.41 0.39 0.53  CREATININE 0.83 0.76 0.84    Estimated Creatinine Clearance: 179.7 mL/min (by C-G formula based on SCr of 0.84 mg/dL).   Medical History: History reviewed. No pertinent past medical history.  Medications:  No meds PTA  Assessment: 40 yr male with no significant PMH presents with complaint of shortness of breath and chest pain.  Recently completed course of Zpack with no resolution of symptoms.  Reports hemoptysis which had been worsening. Pharmacy asked to assist with dosing of heparin infusion on admission. Warfarin added on 11/22.  Significant events:  11/19: CTAngio shows bilateral pulmonary embolism with possible right heart strain  11/20: Echo shows left ventricular apex thrombus  11/22: Doppler + for RLE DVT  Today, 06/23/2016:  Day#5 Heparin-->Warfarin overlap  CBC: WNL, stable.  Hemoptysis improved, only small amount reported by RN.   Heparin level = 0.53 units/mL, therapeutic on heparin infusion at 2350 units/hr  INR = 1.88, rising to goal as anticipated  CrCl: > 100 ml/min  No significant drug-drug interactions  Goal of Therapy: Heparin level 0.3-0.7 units/ml Monitor platelets by anticoagulation protocol: Yes INR 2.5-3 (per Cardiology recommendations)  Plan:  Continue IV heparin  infusion at 2350 units/hr  Warfarin 7.5mg  PO x 1 today   Daily PT/INR, heparin level, CBC  Monitor closely for bleeding, hemoptysis  Overlap with heparin until INR>2 x24hrs  Junita Push, PharmD, BCPS Pager: [phone] 06/23/2016@2 :42 PM

## 2016-06-23 NOTE — Progress Notes (Signed)
PROGRESS NOTE    [name]  JSH:[id number] DOB: [birth year] DOA: 06/17/2016 PCP: No primary care provider on file.    Brief Narrative: Richard [name] a 40 y.o.malewith no significant medical history, presents to the ED with c/o SOB and chest pain. He was found to have multifocal pulmonary embolism, RV strain and pulmonary infarcts. He was started on IV heparin and admitted to step down.  Hypercoagulable work up ordered and 2 D echo ordered.  It showed Large thrombus burding in LV apex and distal septum The cavity size was severely dilated. Wall thickness was normal. The estimated ejection fraction was 15% Diffusehypokinesis. Doppler parameters are consistent with both elevated  ventricular end-diastolic filling pressure and elevated leftatrial filling pressure. Cardiology consulted for recommendations.   Assessment & Plan:   Principal Problem:   Pulmonary embolism with infarction [facility]) Active Problems:   Hemoptysis   Cardiomyopathy (HCC)   Elevated troponin   Abnormal liver function tests   NSVT (nonsustained ventricular tachycardia) (HCC)   Frequent PVCs   Acute thrombus of left ventricle (HCC)   Multiple bilateral pulmonary infarcts with hemoptysis: - patient initially with tachycardia, tachypnea, chest pain, sob, admitted to stepdown - he was started on IV heparin, vital signs improving, transition to coumadin ( due to large left ventricle thrombus) - hgb stable with ongoing hemoptysis while on anticoagulation - hypercoagulable work up in progress.  homocysteine level is 9.7, anti thrombin activity is low at 59. Protein C and S levels are low.  Anti cardiolipid and B2 glycoprotein levels are minimal and negative. Factor 5 leiden negative, prothrombin gene mutation pending Bilateral venous doppler ordered Patient reported trip to [place] in October, but his symptoms dated back to September -cardiology, critical care, hematology  input appreciated   Left Ventricular  apex thrombus:  Started on IV heparin, transition to coumadin on 11/22 Cardiology following  Acute right lower extremity DVT On anticoagulation  Cardiomyopathy of unclear etiology: EF 15% Betablocker started on 11/22,  cardiology recommended coreg and ARB with the ease of transition to entresto, will monitor bp and k level Outpatient Cardiac mri to r/o sarcoidosis may need to repeat echo prior to discharge and may need life vest ,  will follow cardiology recommendations  Elevated troponins from right heart strain and cardiomyopathy  NSVT:  keep k>4, mag >2. tsh wnl On betablocker meds adjustment per cardiology   Elevated liver function : possibly from liver congestion from right heart strain. Hepatitis panel negative. No n/v, no abdominal pain,  lft improving   Constipation: stool softener   DVT prophylaxis: heparin. Then coumadin Code Status: (Full) Family Communication: patient Disposition Plan: transferred out of stepdown to med tele on 11/23, awaiting INR to be close to 2.5, will likely need repeat echo early next week to decide on the need of life vest   Consultants:   PCCM  Cardiology.   hematology   Procedures:   CT angiogram  Echocardiogram.   Venous duplex.   Antimicrobials: none.   Subjective:   He report Feeling  Better,  Less chest pain, less hemoptysis, on room air, denies sob at rest , no dizziness upon standing Report lower extremity edema is improving Had bm Intermittent short run onf  NSVT  Friend in room    Objective: Vitals:   06/22/16 1307 06/22/16 1309 06/22/16 2103 06/23/16 0639  BP: 90/79 104/74 116/87 90/70  Pulse: 97  89 87  Resp:   18 18  Temp: 97.9 F (36.6 C)  98.1 F (36.7  C) 98 F (36.7 C)  TempSrc: Oral  Oral Oral  SpO2:   95% 99%  Weight:    128.5 kg (283 lb 4.7 oz)  Height:        Intake/Output Summary (Last 24 hours) at 06/23/16 1329 Last data filed at 06/23/16 0600  Gross per 24 hour  Intake               570 ml  Output                0 ml  Net              570 ml   Filed Weights   06/21/16 0434 06/22/16 0500 06/23/16 0639  Weight: 125.6 kg (276 lb 14.4 oz) 124.8 kg (275 lb 2.2 oz) 128.5 kg (283 lb 4.7 oz)    Examination:  General exam: Appears calm and comfortable on room air Respiratory system: Clear to auscultation. Respiratory effort normal. Cardiovascular system: S1 & S2 heard, RRR. No JVD, No pedal edema. Gastrointestinal system: Abdomen is nondistended, soft and nontender. No organomegaly or masses felt. Normal bowel sounds heard. Central nervous system: Alert and oriented. No focal neurological deficits. Extremities: Symmetric 5 x 5 power. Trace pitting edema bilateral lower extremity Skin: No rashes, lesions or ulcers Psychiatry: Judgement and insight appear normal. Mood & affect appropriate.     Data Reviewed: I have personally reviewed following labs and imaging studies  CBC:  Recent Labs Lab 06/19/16 0308 06/20/16 0316 06/21/16 0412 06/22/16 0531 06/23/16 0538  WBC 6.5 6.7 5.6 4.6 5.1  HGB 14.0 13.9 13.5 13.4 13.7  HCT 42.4 41.3 39.1 38.7* 40.0  MCV 88.3 86.2 85.0 84.1 84.6  PLT 205 216 197 230 245   Basic Metabolic Panel:  Recent Labs Lab 06/18/16 0858 06/19/16 0308 06/20/16 0316 06/21/16 0412 06/22/16 0531 06/23/16 0538  NA 135 135 136 135 136 136  K 4.9 5.2* 4.1 4.0 4.1 4.3  CL 104 101 102 103 105 104  CO2 25 28 28 26 26 26   GLUCOSE 136* 133* 122* 115* 101* 109*  BUN 14 20 17 14 10 11   CREATININE 0.94 1.17 0.93 0.83 0.76 0.84  CALCIUM 8.2* 8.3* 8.0* 7.8* 7.8* 8.0*  MG 2.0  --   --   --  1.7 2.0   GFR: Estimated Creatinine Clearance: 179.7 mL/min (by C-G formula based on SCr of 0.84 mg/dL). Liver Function Tests:  Recent Labs Lab 06/17/16 0401 06/18/16 0858 06/20/16 0316 06/21/16 0412 06/23/16 0538  AST 87* 28 47* 33 26  ALT 228* 24 30 26 23   ALKPHOS 115 53 61 63 63  BILITOT 2.8* 1.5* 1.5* 1.3* 1.3*  PROT 6.4* 6.5 6.1* 5.8*  5.8*  ALBUMIN 3.1* 2.6* 2.3* 2.1* 2.3*   No results for input(s): LIPASE, AMYLASE in the last 168 hours. No results for input(s): AMMONIA in the last 168 hours. Coagulation Profile:  Recent Labs Lab 06/17/16 0546 06/20/16 0316 06/21/16 0412 06/22/16 0531 06/23/16 0538  INR 1.79 1.44 1.33 1.61 1.88   Cardiac Enzymes:  Recent Labs Lab 06/17/16 2031 06/18/16 0150 06/18/16 0858  TROPONINI 0.04* 0.04* 0.04*   BNP (last 3 results) No results for input(s): PROBNP in the last 8760 hours. HbA1C: No results for input(s): HGBA1C in the last 72 hours. CBG: No results for input(s): GLUCAP in the last 168 hours. Lipid Profile: No results for input(s): CHOL, HDL, LDLCALC, TRIG, CHOLHDL, LDLDIRECT in the last 72 hours. Thyroid Function Tests: No results for input(s):  TSH, T4TOTAL, FREET4, T3FREE, THYROIDAB in the last 72 hours. Anemia Panel: No results for input(s): VITAMINB12, FOLATE, FERRITIN, TIBC, IRON, RETICCTPCT in the last 72 hours. Sepsis Labs: No results for input(s): PROCALCITON, LATICACIDVEN in the last 168 hours.  Recent Results (from the past 240 hour(s))  MRSA PCR Screening     Status: None   Collection Time: 06/17/16  6:34 AM  Result Value Ref Range Status   MRSA by PCR NEGATIVE NEGATIVE Final    Comment:        The GeneXpert MRSA Assay (FDA approved for NASAL specimens only), is one component of a comprehensive MRSA colonization surveillance program. It is not intended to diagnose MRSA infection nor to guide or monitor treatment for MRSA infections.          Radiology Studies: No results found.      Scheduled Meds: . carvedilol  6.25 mg Oral BID WC  . guaiFENesin  600 mg Oral BID  . irbesartan  75 mg Oral Daily  . polyethylene glycol  17 g Oral Daily  . senna-docusate  1 tablet Oral BID  . sodium chloride flush  3 mL Intravenous Q12H  . Warfarin - Pharmacist Dosing Inpatient   Does not apply q1800   Continuous Infusions: . heparin 2,350  Units/hr (06/23/16 0838)     LOS: 6 days    Time spent: 15 minutes.     Albertine Grates, MD PhD [facility] Pager [phone]   If 7PM-7AM, please contact night-coverage [email or web address] Password [email or web address] 06/23/2016, 1:29 PM

## 2016-06-24 LAB — CBC
HCT: 38.9 % — ABNORMAL LOW (ref 39.0–52.0)
HEMOGLOBIN: 13.3 g/dL (ref 13.0–17.0)
MCH: 28.5 pg (ref 26.0–34.0)
MCHC: 34.2 g/dL (ref 30.0–36.0)
MCV: 83.5 fL (ref 78.0–100.0)
PLATELETS: 248 10*3/uL (ref 150–400)
RBC: 4.66 MIL/uL (ref 4.22–5.81)
RDW: 15.4 % (ref 11.5–15.5)
WBC: 4.8 10*3/uL (ref 4.0–10.5)

## 2016-06-24 LAB — PROTHROMBIN GENE MUTATION

## 2016-06-24 LAB — PROTIME-INR
INR: 2
PROTHROMBIN TIME: 23 s — AB (ref 11.4–15.2)

## 2016-06-24 LAB — HEPARIN LEVEL (UNFRACTIONATED): HEPARIN UNFRACTIONATED: 0.56 [IU]/mL (ref 0.30–0.70)

## 2016-06-24 MED ORDER — WARFARIN SODIUM 2.5 MG PO TABS
7.5000 mg | ORAL_TABLET | Freq: Once | ORAL | Status: AC
  Administered 2016-06-24: 7.5 mg via ORAL
  Filled 2016-06-24: qty 1

## 2016-06-24 NOTE — Progress Notes (Signed)
PROGRESS NOTE    Richard [name]  NWG:[id number] DOB: May 13, 1976 DOA: 06/17/2016 PCP: No primary care provider on file.   Brief Narrative: Richard [name] a 40 y.o.malewith no significant medical history,presents to the ED with c/o SOB and chest pain. He was found to have multifocal pulmonary embolism, RV strain and pulmonary infarcts.  Assessment & Plan:   # Multiple b/l Pulmonary embolism with infarction and hemoptysis: -INR 2 today. Continue IV heparin for at least 24 hour more to overlapped with Coumadin. Clinically improving. -Hematology consult appreciated. Advised outpatient follow-up.(Hypercoagulable work up in progress.  homocysteine level is 9.7, anti thrombin activity is low at 59. Protein C and S levels are low.  Anti cardiolipid and B2 glycoprotein levels are minimal and negative. Factor 5 leiden negative, prothrombin gene mutation pending)  #Left Ventricular apex thrombus:  on systemic anticoagulation.  # Cardiomyopathy (HCC):Ventricular ejection fraction of 15%. On Coreg, Avapro with parameter. Held this morning because of hypotension. Cardiology consult appreciated. Advised outpatient follow-up.   # Abnormal liver function tests: Possibly because of right heart strain. Hepatitis panel negative. Monitor.  # NSVT (nonsustained ventricular tachycardia) (HCC): Monitor electrolytes. On beta blocker.  We will do PT, OT evaluation today.  DVT prophylaxis: Systemic anticoagulation Code Status: Full code Family Communication: Patient's girlfriend at bedside Disposition Plan: Likely discharge home tomorrow.  Consultants:   Cardiology, hematology and pulmonologist.  Procedures: Echo Antimicrobials: None  Subjective: Patient was seen and examined at bedside. Patient reported feeling good. Denied headache, dizziness, chest pain, shortness of breath, nausea or vomiting. Patient's girlfriend bedside.   Objective: Vitals:   06/23/16 2022 06/24/16 0616 06/24/16 0640  06/24/16 0842  BP: 96/63 (!) 87/69 98/78 93/72   Pulse: 64 89  90  Resp: 18 17    Temp: 98.1 F (36.7 C) 97.7 F (36.5 C)    TempSrc: Oral Oral    SpO2: 100% 98%    Weight:  130.1 kg (286 lb 13.1 oz)    Height:        Intake/Output Summary (Last 24 hours) at 06/24/16 1244 Last data filed at 06/24/16 0901  Gross per 24 hour  Intake              804 ml  Output             1125 ml  Net             -321 ml   Filed Weights   06/22/16 0500 06/23/16 0639 06/24/16 0616  Weight: 124.8 kg (275 lb 2.2 oz) 128.5 kg (283 lb 4.7 oz) 130.1 kg (286 lb 13.1 oz)    Examination:  General exam: Appears calm and comfortable  Respiratory system: Clear to auscultation. Respiratory effort normal. No wheezing or crackle Cardiovascular system: S1 & S2 heard, RRR.  No pedal edema. Gastrointestinal system: Abdomen is nondistended, soft and nontender. Normal bowel sounds heard. Central nervous system: Alert and oriented. No focal neurological deficits. Extremities: Symmetric 5 x 5 power. Skin: No rashes, lesions or ulcers Psychiatry: Judgement and insight appear normal. Mood & affect appropriate.     Data Reviewed: I have personally reviewed following labs and imaging studies  CBC:  Recent Labs Lab 06/20/16 0316 06/21/16 0412 06/22/16 0531 06/23/16 0538 06/24/16 0526  WBC 6.7 5.6 4.6 5.1 4.8  HGB 13.9 13.5 13.4 13.7 13.3  HCT 41.3 39.1 38.7* 40.0 38.9*  MCV 86.2 85.0 84.1 84.6 83.5  PLT 216 197 230 245 248   Basic Metabolic Panel:  Recent Labs Lab  06/18/16 4599 06/19/16 0308 06/20/16 0316 06/21/16 0412 06/22/16 0531 06/23/16 0538  NA 135 135 136 135 136 136  K 4.9 5.2* 4.1 4.0 4.1 4.3  CL 104 101 102 103 105 104  CO2 25 28 28 26 26 26   GLUCOSE 136* 133* 122* 115* 101* 109*  BUN 14 20 17 14 10 11   CREATININE 0.94 1.17 0.93 0.83 0.76 0.84  CALCIUM 8.2* 8.3* 8.0* 7.8* 7.8* 8.0*  MG 2.0  --   --   --  1.7 2.0   GFR: Estimated Creatinine Clearance: 180.9 mL/min (by C-G  formula based on SCr of 0.84 mg/dL). Liver Function Tests:  Recent Labs Lab 06/18/16 0858 06/20/16 0316 06/21/16 0412 06/23/16 0538  AST 28 47* 33 26  ALT 24 30 26 23   ALKPHOS 53 61 63 63  BILITOT 1.5* 1.5* 1.3* 1.3*  PROT 6.5 6.1* 5.8* 5.8*  ALBUMIN 2.6* 2.3* 2.1* 2.3*   No results for input(s): LIPASE, AMYLASE in the last 168 hours. No results for input(s): AMMONIA in the last 168 hours. Coagulation Profile:  Recent Labs Lab 06/20/16 0316 06/21/16 0412 06/22/16 0531 06/23/16 0538 06/24/16 0526  INR 1.44 1.33 1.61 1.88 2.00   Cardiac Enzymes:  Recent Labs Lab 06/17/16 2031 06/18/16 0150 06/18/16 0858  TROPONINI 0.04* 0.04* 0.04*   BNP (last 3 results) No results for input(s): PROBNP in the last 8760 hours. HbA1C: No results for input(s): HGBA1C in the last 72 hours. CBG: No results for input(s): GLUCAP in the last 168 hours. Lipid Profile: No results for input(s): CHOL, HDL, LDLCALC, TRIG, CHOLHDL, LDLDIRECT in the last 72 hours. Thyroid Function Tests: No results for input(s): TSH, T4TOTAL, FREET4, T3FREE, THYROIDAB in the last 72 hours. Anemia Panel: No results for input(s): VITAMINB12, FOLATE, FERRITIN, TIBC, IRON, RETICCTPCT in the last 72 hours. Sepsis Labs: No results for input(s): PROCALCITON, LATICACIDVEN in the last 168 hours.  Recent Results (from the past 240 hour(s))  MRSA PCR Screening     Status: None   Collection Time: 06/17/16  6:34 AM  Result Value Ref Range Status   MRSA by PCR NEGATIVE NEGATIVE Final    Comment:        The GeneXpert MRSA Assay (FDA approved for NASAL specimens only), is one component of a comprehensive MRSA colonization surveillance program. It is not intended to diagnose MRSA infection nor to guide or monitor treatment for MRSA infections.          Radiology Studies: No results found.      Scheduled Meds: . carvedilol  6.25 mg Oral BID WC  . guaiFENesin  600 mg Oral BID  . irbesartan  75 mg Oral  Daily  . magnesium oxide  400 mg Oral Daily  . sodium chloride flush  3 mL Intravenous Q12H  . Warfarin - Pharmacist Dosing Inpatient   Does not apply q1800   Continuous Infusions: . heparin 2,350 Units/hr (06/24/16 0842)     LOS: 7 days    Dron Jaynie Collins, MD [facility] Pager [phone]  If 7PM-7AM, please contact night-coverage [email or web address] Password [facility] 06/24/2016, 12:44 PM

## 2016-06-24 NOTE — Care Management Note (Signed)
Case Management Note  Patient Details  Name: Richard [name] MRN: [id number] Date of Birth: 1976-01-12  Subjective/Objective: 40 y/o m admitted w/Pulmonary Embolism. From home. Patient works. No health insurance,no pcp-provided w/unisnured pcp listing-encouraged [facility]-informed patient to call for pcp appt-call today to set up. Provided w/health insurance,community resources,$4[employer] med list. Patient voiced understanding.                   Action/Plan:d/c plan home.   Expected Discharge Date:   (unknown)               Expected Discharge Plan:  Home/Self Care  In-House Referral:     Discharge planning Services  CM Consult, Medication Assistance, Indigent Health Clinic  Post Acute Care Choice:    Choice offered to:     DME Arranged:    DME Agency:     HH Arranged:    HH Agency:     Status of Service:  In process, will continue to follow  If discussed at Long Length of Stay Meetings, dates discussed:    Additional Comments:  Lanier Clam, RN 06/24/2016, 12:17 PM

## 2016-06-24 NOTE — Progress Notes (Signed)
PT Cancellation Note  Patient Details Name: Richard [name] MRN: [id number] DOB: 1976/05/11   Cancelled Treatment:    Reason Eval/Treat Not Completed: PT screened, no needs identified, will sign off. Spoke with pt who denied need for PT services. Pt stated " I can move. I don't have any problem with that."   Rebeca Alert, MPT Pager: [phone]

## 2016-06-24 NOTE — Progress Notes (Signed)
OT Cancellation Note  Patient Details Name: Richard [name] MRN: [id number] DOB: 19-Mar-1976   Cancelled Treatment:    Reason Eval/Treat Not Completed: OT screened, no needs identified, will sign off  Lajoyce Tamura, [place] 06/24/2016, 3:19 PM

## 2016-06-24 NOTE — Progress Notes (Addendum)
ANTICOAGULATION CONSULT NOTE  Pharmacy Consult for Heparin, Warfarin Indication: multiple bilateral pulmonary embolism, acute thrombus of left ventricle, RLE DVT  No Known Allergies  Patient Measurements: Height: 6\' 7"  (200.7 cm) Weight: 286 lb 13.1 oz (130.1 kg) IBW/kg (Calculated) : 93.7 Heparin Dosing Weight: actual body weight  Vital Signs: Temp: 97.7 F (36.5 C) (11/27 0616) Temp Source: Oral (11/27 0616) BP: 93/72 (11/27 0842) Pulse Rate: 90 (11/27 0842)  Labs:  Recent Labs  06/22/16 0531 06/23/16 0538 06/24/16 0526  HGB 13.4 13.7 13.3  HCT 38.7* 40.0 38.9*  PLT 230 245 248  LABPROT 19.4* 21.9* 23.0*  INR 1.61 1.88 2.00  HEPARINUNFRC 0.39 0.53 0.56  CREATININE 0.76 0.84  --     Estimated Creatinine Clearance: 180.9 mL/min (by C-G formula based on SCr of 0.84 mg/dL).   Medical History: History reviewed. No pertinent past medical history.  Medications:  No meds PTA  Assessment: 40 yr male with no significant PMH presents with complaint of shortness of breath and chest pain.  Recently completed course of Zpack with no resolution of symptoms.  Reports hemoptysis which had been worsening. Pharmacy asked to assist with dosing of heparin infusion on admission. Warfarin added on 11/22.  Significant events:  11/19: CTAngio shows bilateral pulmonary embolism with possible right heart strain  11/20: Echo shows left ventricular apex thrombus  11/22: Doppler + for RLE DVT  Today, 06/24/2016:  Day#6 Heparin-->Warfarin overlap. Per Dr. Ronalee Belts, to continue heparin bridge for at least 24 more hours  Heparin level = 0.56 units/mL, therapeutic on heparin infusion at 2350 units/hr  INR still subtherapeutic but rising appropriately  CrCl: > 100 ml/min  CBC: WNL, stable.  Hemoptysis improved, only small amount reported by RN.   No significant drug-drug interactions  Eating 75% of meals  Goal of Therapy: Heparin level 0.3-0.7 units/ml Monitor platelets by  anticoagulation protocol: Yes INR 2.5-3 (per Cardiology recommendations)  Plan:  Continue IV heparin infusion at 2350 units/hr until tomorrow.   Warfarin 7.5mg  PO x 1 today   Daily PT/INR, heparin level, CBC  Monitor closely for bleeding, hemoptysis  Richard [name], PharmD Candidate 06/24/2016@1 :42 PM  Richard [name], PharmD, BCPS Pager: [phone] 06/24/2016, 1:43 PM

## 2016-06-24 NOTE — Progress Notes (Signed)
     SUBJECTIVE: No pain. Breathing improved.   Tele: sinus, PVCs  BP 93/72 (BP Location: Right Arm)   Pulse 90   Temp 97.7 F (36.5 C) (Oral)   Resp 17   Ht 6\' 7"  (2.007 m)   Wt 286 lb 13.1 oz (130.1 kg)   SpO2 98%   BMI 32.31 kg/m   Intake/Output Summary (Last 24 hours) at 06/24/16 1059 Last data filed at 06/24/16 0901  Gross per 24 hour  Intake              804 ml  Output             1125 ml  Net             -321 ml    PHYSICAL EXAM General: Well developed, well nourished, in no acute distress. Alert and oriented x 3.  Psych:  Good affect, responds appropriately Neck: No JVD. No masses noted.  Lungs: Clear bilaterally with no wheezes or rhonci noted.  Heart: RRR with no murmurs noted. Abdomen: Bowel sounds are present. Soft, non-tender.  Extremities: No lower extremity edema.   LABS: Basic Metabolic Panel:  Recent Labs  98/42/10 0531 06/23/16 0538  NA 136 136  K 4.1 4.3  CL 105 104  CO2 26 26  GLUCOSE 101* 109*  BUN 10 11  CREATININE 0.76 0.84  CALCIUM 7.8* 8.0*  MG 1.7 2.0   CBC:  Recent Labs  06/23/16 0538 06/24/16 0526  WBC 5.1 4.8  HGB 13.7 13.3  HCT 40.0 38.9*  MCV 84.6 83.5  PLT 245 248    Current Meds: . carvedilol  6.25 mg Oral BID WC  . guaiFENesin  600 mg Oral BID  . irbesartan  75 mg Oral Daily  . magnesium oxide  400 mg Oral Daily  . sodium chloride flush  3 mL Intravenous Q12H  . Warfarin - Pharmacist Dosing Inpatient   Does not apply q1800     ASSESSMENT AND PLAN:Previously healthy 40 yo with simultaneous diagnosis of large pulmonary embolism and severe dilated cardiomyopathy with biventricular involvement and large LV thrombus  1. 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 No calcium noted in coronary arteries on chest CT.  Sarcoidosis and hemochromatosis should  be considered. Consider cardiac MRI as outpatient. He is now on an ARB and beta blocker. I do not think a repeat echo is indicated. I do not think a Lifevest is indicated at this appears to be a chronic non-ischemic cardiomyopathy. Will continue medical management for now. Will arrange f/u in the CHF clinic.   2.LV thrombus: at risk for systemic embolism/stroke. He is now on coumadin and INR is 2.0 today. Probably needs lifelong anticoagulation, unless there is an unexpected improvement in LV function.  3. Non-sustained VT: He has PVCs. No long runs of VT. Continue beta blocker.   4. DVT/PE: has evidence of residual thrombus in R calf. Symptoms of PE preceded his plane trip to [facility], which may have worsened the thrombotic process.  OK to discharge home from cardiac standpoint today. We will arrange f/u in the CHF clinic. He will also need f/u in our coumadin clinic.      Richard [name]  11/27/201710:59 AM

## 2016-06-25 LAB — CBC
HCT: 39.1 % (ref 39.0–52.0)
Hemoglobin: 13.2 g/dL (ref 13.0–17.0)
MCH: 28.1 pg (ref 26.0–34.0)
MCHC: 33.8 g/dL (ref 30.0–36.0)
MCV: 83.4 fL (ref 78.0–100.0)
PLATELETS: 271 10*3/uL (ref 150–400)
RBC: 4.69 MIL/uL (ref 4.22–5.81)
RDW: 15.6 % — AB (ref 11.5–15.5)
WBC: 4.3 10*3/uL (ref 4.0–10.5)

## 2016-06-25 LAB — PROTIME-INR
INR: 1.71
INR: 1.8
Prothrombin Time: 20.3 seconds — ABNORMAL HIGH (ref 11.4–15.2)
Prothrombin Time: 21.1 seconds — ABNORMAL HIGH (ref 11.4–15.2)

## 2016-06-25 LAB — HEPARIN LEVEL (UNFRACTIONATED)
HEPARIN UNFRACTIONATED: 0.26 [IU]/mL — AB (ref 0.30–0.70)
Heparin Unfractionated: 0.67 IU/mL (ref 0.30–0.70)

## 2016-06-25 MED ORDER — WARFARIN SODIUM 5 MG PO TABS
12.5000 mg | ORAL_TABLET | ORAL | Status: AC
  Administered 2016-06-25: 12.5 mg via ORAL
  Filled 2016-06-25: qty 1

## 2016-06-25 MED ORDER — ZOLPIDEM TARTRATE 5 MG PO TABS
5.0000 mg | ORAL_TABLET | Freq: Every evening | ORAL | Status: DC | PRN
  Administered 2016-06-26: 5 mg via ORAL
  Filled 2016-06-25: qty 1

## 2016-06-25 NOTE — Progress Notes (Addendum)
   I have arranged follow-up at the Coumadin Clinic and CHF Clinic (Appointment times listed in AVS). Please contact the Card Master or on-call provider if we can be of further assistance.   Signed, Ellsworth Lennox, PA-C 06/25/2016, 9:39 AM   ADDENDUM: Contacted by Pharmacy in regards to the patient's INR. He should remain Inpatient until INR is greater than 2.0 unless he can receive a Lovenox bridge as an outpatient. Initial INR appointment was scheduled for 06/27/2016. If he is not discharged tomorrow, this will need to be rescheduled for early next week.   Signed, Ellsworth Lennox, PA-C 06/25/2016, 11:50 AM Pager: [phone]

## 2016-06-25 NOTE — Progress Notes (Signed)
ANTICOAGULATION CONSULT NOTE  Pharmacy Consult for Heparin, Warfarin Indication: multiple bilateral pulmonary embolism, acute thrombus of left ventricle, RLE DVT  No Known Allergies  Patient Measurements: Height: 6\' 7"  (200.7 cm) Weight: 286 lb 2.5 oz (129.8 kg) IBW/kg (Calculated) : 93.7 Heparin Dosing Weight: actual body weight  Vital Signs: Temp: 98.1 F (36.7 C) (11/28 0512) Temp Source: Oral (11/28 0512) BP: 90/60 (11/28 1016) Pulse Rate: 71 (11/28 1016)  Labs:  Recent Labs  06/23/16 0538 06/24/16 0526 06/25/16 0533  HGB 13.7 13.3 13.2  HCT 40.0 38.9* 39.1  PLT 245 248 271  LABPROT 21.9* 23.0* 20.3*  INR 1.88 2.00 1.71  HEPARINUNFRC 0.53 0.56 <0.10*  CREATININE 0.84  --   --     Estimated Creatinine Clearance: 180.5 mL/min (by C-G formula based on SCr of 0.84 mg/dL).   Medical History: History reviewed. No pertinent past medical history.  Medications:  No meds PTA  Assessment: 40 yr male with no significant PMH presents with complaint of shortness of breath and chest pain.  Recently completed course of Zpack with no resolution of symptoms.  Reports hemoptysis which had been worsening. Pharmacy asked to assist with dosing of heparin infusion on admission. Warfarin added on 11/22.  Significant events:  11/19: CTAngio shows bilateral pulmonary embolism with possible right heart strain  11/20: Echo shows left ventricular apex thrombus  11/22: Doppler + for RLE DVT  Today, 06/24/2016:  Day#7 Heparin-->Warfarin overlap. Per Dr. Ronalee Belts, to continue heparin bridge for at least 24 more hours  Heparin level = <0.1 units/mL this am, Has been therapeutic on heparin infusion at 2350 units/hr since 11/22 PM.    11/28 When went into room at ~10:35am, heparin infusing at 2000 units/hr instead of ordered 2350 units/hr.  Discuss with RN - at most at this rate for .  So this would not account for this low level drawn at ~5am. Repeat heparin level = 0.26 - this  is despite heparin being infused at lower rate  INR subtherapeutic, trended down overnight. Repeat INR confirms subtherapeutic  Patient denies green-leafy vegetables on 11/27  CrCl: > 100 ml/min  CBC: WNL, stable.  Hemoptysis resolved  No significant drug-drug interactions  Eating well  Goal of Therapy: Heparin level 0.3-0.7 units/ml Monitor platelets by anticoagulation protocol: Yes INR 2.5-3 (per Cardiology recommendations)  Plan:  Continue IV heparin infusion at 2350 units/hr since repeat heparin level shows slightly subtherapeutic heparin level despite being infused at lower rate for 35-45 minutes prior to lab draw.  Repeat heparin level later this evening  Warfarin 12.5mg  PO x 1 tonight, give booster dose in hope of getting INR > 2 11/29 to allow for discharge.  Anticipate patient will require lower dose than this at discharge  Daily PT/INR, heparin level, CBC  Monitor closely for bleeding, hemoptysis  Juliette Alcide, PharmD, BCPS.   Pager: [phone] 06/25/2016 10:59 AM

## 2016-06-25 NOTE — Progress Notes (Addendum)
PROGRESS NOTE    Richard [name]  XBJ:[id number] DOB: 1976/05/21 DOA: 06/17/2016 PCP: No primary care provider on file.   Brief Narrative: Richard [name] a 40 y.o.malewith no significant medical history,presents to the ED with c/o SOB and chest pain. He was found to have multifocal pulmonary embolism, RV strain and pulmonary infarcts.  Assessment & Plan:   # Multiple b/l Pulmonary embolism with infarction and hemoptysis: -INR 1.7 and later repeated INR 1.8. Continue IV heparin for at least 24 hour more to overlapped with Coumadin until INR is >2.0. On discharge, he will follow up with cardiologist for INR monitoring, has appointment on 11/30.Clinically stable. Hopefully, his INR will be >2 tomorrow and can discharge home. The other option would be lovenox sq. Discussed with the pharmacist. Will give higher dose coumadin sooner today. -Hematology consult appreciated. Advised outpatient follow-up.(Hypercoagulable work up in progress.  homocysteine level is 9.7, anti thrombin activity is low at 59. Protein C and S levels are low.  Anti cardiolipid and B2 glycoprotein levels are minimal and negative. Factor 5 leiden negative, prothrombin gene mutation pending)  #Left Ventricular apex thrombus:  on systemic anticoagulation. Cardiology follow up.  # Cardiomyopathy (HCC):Ventricular ejection fraction of 15%. On Coreg, Avapro with parameter. BP borderline low. Cardiology consult appreciated. Advised outpatient follow-up.   # Abnormal liver function tests: Possibly because of right heart strain. Hepatitis panel negative. Monitor.  # NSVT (nonsustained ventricular tachycardia) (HCC): Monitor electrolytes. On beta blocker.  DVT prophylaxis: Systemic anticoagulation Code Status: Full code Family Communication: Patient's girlfriend at bedside Disposition Plan: Likely discharge home tomorrow.  Consultants:   Cardiology, hematology and pulmonologist.  Procedures: Echo Antimicrobials:  None  Subjective: Patient was seen and examined at bedside. Feels weak. Denied headache, dizziness, nausea, neck, chest pain, shortness of breath.  Objective: Vitals:   06/24/16 1731 06/24/16 2031 06/25/16 0512 06/25/16 1016  BP: 93/66 90/72 104/63 90/60  Pulse: 98 93 100 71  Resp:  16 19   Temp:  98 F (36.7 C) 98.1 F (36.7 C)   TempSrc:  Oral Oral   SpO2:  100% 100%   Weight:   129.8 kg (286 lb 2.5 oz)   Height:        Intake/Output Summary (Last 24 hours) at 06/25/16 1406 Last data filed at 06/25/16 1300  Gross per 24 hour  Intake              564 ml  Output              875 ml  Net             -311 ml   Filed Weights   06/23/16 0639 06/24/16 0616 06/25/16 0512  Weight: 128.5 kg (283 lb 4.7 oz) 130.1 kg (286 lb 13.1 oz) 129.8 kg (286 lb 2.5 oz)    Examination:  General exam: Appears calm and comfortable  Respiratory system: Clear bilaterally, no wheezing or crackle Cardiovascular system: Regular rate rhythm, S1 and S2 normal.  Trace pedal edema. Gastrointestinal system: Abdomen soft, nontender, nondistended. Bowel sound positive. Central nervous system: Alert and oriented. No focal neurological deficits. Extremities: Symmetric 5 x 5 power. Skin: No rashes, lesions or ulcers Psychiatry: Judgement and insight appear normal. Mood & affect appropriate.     Data Reviewed: I have personally reviewed following labs and imaging studies  CBC:  Recent Labs Lab 06/21/16 0412 06/22/16 0531 06/23/16 0538 06/24/16 0526 06/25/16 0533  WBC 5.6 4.6 5.1 4.8 4.3  HGB 13.5 13.4 13.7 13.3  13.2  HCT 39.1 38.7* 40.0 38.9* 39.1  MCV 85.0 84.1 84.6 83.5 83.4  PLT 197 230 245 248 271   Basic Metabolic Panel:  Recent Labs Lab 06/19/16 0308 06/20/16 0316 06/21/16 0412 06/22/16 0531 06/23/16 0538  NA 135 136 135 136 136  K 5.2* 4.1 4.0 4.1 4.3  CL 101 102 103 105 104  CO2 28 28 26 26 26   GLUCOSE 133* 122* 115* 101* 109*  BUN 20 17 14 10 11   CREATININE 1.17 0.93 0.83  0.76 0.84  CALCIUM 8.3* 8.0* 7.8* 7.8* 8.0*  MG  --   --   --  1.7 2.0   GFR: Estimated Creatinine Clearance: 180.5 mL/min (by C-G formula based on SCr of 0.84 mg/dL). Liver Function Tests:  Recent Labs Lab 06/20/16 0316 06/21/16 0412 06/23/16 0538  AST 47* 33 26  ALT 30 26 23   ALKPHOS 61 63 63  BILITOT 1.5* 1.3* 1.3*  PROT 6.1* 5.8* 5.8*  ALBUMIN 2.3* 2.1* 2.3*   No results for input(s): LIPASE, AMYLASE in the last 168 hours. No results for input(s): AMMONIA in the last 168 hours. Coagulation Profile:  Recent Labs Lab 06/22/16 0531 06/23/16 0538 06/24/16 0526 06/25/16 0533 06/25/16 1259  INR 1.61 1.88 2.00 1.71 1.80   Cardiac Enzymes: No results for input(s): CKTOTAL, CKMB, CKMBINDEX, TROPONINI in the last 168 hours. BNP (last 3 results) No results for input(s): PROBNP in the last 8760 hours. HbA1C: No results for input(s): HGBA1C in the last 72 hours. CBG: No results for input(s): GLUCAP in the last 168 hours. Lipid Profile: No results for input(s): CHOL, HDL, LDLCALC, TRIG, CHOLHDL, LDLDIRECT in the last 72 hours. Thyroid Function Tests: No results for input(s): TSH, T4TOTAL, FREET4, T3FREE, THYROIDAB in the last 72 hours. Anemia Panel: No results for input(s): VITAMINB12, FOLATE, FERRITIN, TIBC, IRON, RETICCTPCT in the last 72 hours. Sepsis Labs: No results for input(s): PROCALCITON, LATICACIDVEN in the last 168 hours.  Recent Results (from the past 240 hour(s))  MRSA PCR Screening     Status: None   Collection Time: 06/17/16  6:34 AM  Result Value Ref Range Status   MRSA by PCR NEGATIVE NEGATIVE Final    Comment:        The GeneXpert MRSA Assay (FDA approved for NASAL specimens only), is one component of a comprehensive MRSA colonization surveillance program. It is not intended to diagnose MRSA infection nor to guide or monitor treatment for MRSA infections.          Radiology Studies: No results found.      Scheduled Meds: .  carvedilol  6.25 mg Oral BID WC  . guaiFENesin  600 mg Oral BID  . irbesartan  75 mg Oral Daily  . sodium chloride flush  3 mL Intravenous Q12H  . warfarin  12.5 mg Oral NOW  . Warfarin - Pharmacist Dosing Inpatient   Does not apply q1800   Continuous Infusions: . heparin 2,350 Units/hr (06/25/16 1016)     LOS: 8 days    Carlis Blanchard Jaynie Collins, MD [facility] Pager [phone]  If 7PM-7AM, please contact night-coverage [email or web address] Password [email or web address] 06/25/2016, 2:06 PM

## 2016-06-25 NOTE — Progress Notes (Signed)
PHARMACY - HEPARIN (brief note)  IV heparin infusing @ 2350 units/hr for treatment of multiple bilateral pulmonary embolism, acute thrombus of left ventricle and RLE DVT.  Heparin level = 0.67 (Goal 0.3-0.7)  No complications of therapy noted  Plan:  Continue IV heparin @ 2350 units/hr F/U AM labs  Terrilee Files, PharmD 06/25/16 @ 19:27

## 2016-06-26 DIAGNOSIS — I952 Hypotension due to drugs: Secondary | ICD-10-CM

## 2016-06-26 LAB — PROTIME-INR
INR: 1.95
PROTHROMBIN TIME: 22.5 s — AB (ref 11.4–15.2)

## 2016-06-26 LAB — HEPARIN LEVEL (UNFRACTIONATED): HEPARIN UNFRACTIONATED: 0.45 [IU]/mL (ref 0.30–0.70)

## 2016-06-26 LAB — CBC
HCT: 37.3 % — ABNORMAL LOW (ref 39.0–52.0)
Hemoglobin: 12.7 g/dL — ABNORMAL LOW (ref 13.0–17.0)
MCH: 28.6 pg (ref 26.0–34.0)
MCHC: 34 g/dL (ref 30.0–36.0)
MCV: 84 fL (ref 78.0–100.0)
PLATELETS: 276 10*3/uL (ref 150–400)
RBC: 4.44 MIL/uL (ref 4.22–5.81)
RDW: 15.7 % — ABNORMAL HIGH (ref 11.5–15.5)
WBC: 4.2 10*3/uL (ref 4.0–10.5)

## 2016-06-26 MED ORDER — WARFARIN SODIUM 5 MG PO TABS
12.5000 mg | ORAL_TABLET | ORAL | Status: AC
  Administered 2016-06-26: 12.5 mg via ORAL
  Filled 2016-06-26: qty 1

## 2016-06-26 MED ORDER — SODIUM CHLORIDE 0.9 % IV BOLUS (SEPSIS)
500.0000 mL | Freq: Once | INTRAVENOUS | Status: AC
  Administered 2016-06-26: 500 mL via INTRAVENOUS

## 2016-06-26 NOTE — Progress Notes (Signed)
ANTICOAGULATION CONSULT NOTE  Pharmacy Consult for Heparin, Warfarin Indication: multiple bilateral pulmonary embolism, acute thrombus of left ventricle, RLE DVT  No Known Allergies  Patient Measurements: Height: 6\' 7"  (200.7 cm) Weight: 282 lb 3 oz (128 kg) IBW/kg (Calculated) : 93.7 Heparin Dosing Weight: actual body weight  Vital Signs: Temp: 98.3 F (36.8 C) (11/29 0618) Temp Source: Oral (11/29 0618) BP: 67/46 (11/29 0618) Pulse Rate: 95 (11/29 0618)  Labs:  Recent Labs  06/24/16 0526 06/25/16 0533 06/25/16 1259 06/25/16 1813 06/26/16 0549  HGB 13.3 13.2  --   --  12.7*  HCT 38.9* 39.1  --   --  37.3*  PLT 248 271  --   --  276  LABPROT 23.0* 20.3* 21.1*  --  22.5*  INR 2.00 1.71 1.80  --  1.95  HEPARINUNFRC 0.56 <0.10* 0.26* 0.67 0.45    Estimated Creatinine Clearance: 179.4 mL/min (by C-G formula based on SCr of 0.84 mg/dL).   Medical History: History reviewed. No pertinent past medical history.  Medications:  No meds PTA  Assessment: 40 yr male with no significant PMH presents with complaint of shortness of breath and chest pain.  Recently completed course of Zpack with no resolution of symptoms.  Reports hemoptysis which had been worsening. Pharmacy asked to assist with dosing of heparin infusion on admission. Warfarin added on 11/22.  Significant events:  11/19: CTAngio shows bilateral pulmonary embolism with possible right heart strain  11/20: Echo shows left ventricular apex thrombus  11/22: Doppler + for RLE DVT  Today, 06/24/2016:  Day#8 Heparin-->Warfarin overlap.   Heparin level  therapeutic on heparin infusion at 2350 units/hr  INR subtherapeutic, trending back up  Patient denies green-leafy vegetables on 11/27  CrCl: > 100 ml/min  CBC: WNL, stable.  Hemoptysis resolved  No significant drug-drug interactions  Eating well  Goal of Therapy: Heparin level 0.3-0.7 units/ml Monitor platelets by anticoagulation protocol:  Yes INR 2.5-3 (per Cardiology recommendations)  Plan:  Continue IV heparin infusion at 2350 units/hr   Warfarin 12.5mg  PO x 1 now, give booster dose in hope of getting INR > 2 11/29 to allow for discharge.  Anticipate patient will require lower dose than this at discharge  Daily PT/INR, heparin level, CBC  Monitor closely for bleeding, hemoptysis  Junita Push, PharmD, BCPS Pager: [phone] 06/26/2016 11:35 AM

## 2016-06-26 NOTE — Progress Notes (Signed)
ANTICOAGULATION CONSULT NOTE - Follow Up Consult  Pharmacy Consult for Heparin Indication: multiple bilateral pulmonary embolism, acute thrombus of left ventricle and RLE DVT  No Known Allergies  Patient Measurements: Height: 6\' 7"  (200.7 cm) Weight: 282 lb 3 oz (128 kg) IBW/kg (Calculated) : 93.7 Heparin Dosing Weight:   Vital Signs: Temp: 98.3 F (36.8 C) (11/29 0618) Temp Source: Oral (11/29 0618) BP: 67/46 (11/29 0618) Pulse Rate: 95 (11/29 0618)  Labs:  Recent Labs  06/24/16 0526 06/25/16 0533 06/25/16 1259 06/25/16 1813 06/26/16 0549  HGB 13.3 13.2  --   --  12.7*  HCT 38.9* 39.1  --   --  37.3*  PLT 248 271  --   --  276  LABPROT 23.0* 20.3* 21.1*  --  22.5*  INR 2.00 1.71 1.80  --  1.95  HEPARINUNFRC 0.56 <0.10* 0.26* 0.67 0.45    Estimated Creatinine Clearance: 179.4 mL/min (by C-G formula based on SCr of 0.84 mg/dL).   Medications:  Infusions:  . heparin 2,350 Units/hr (06/25/16 2110)    Assessment: Patient with heparin level at goal.  No heparin issues noted.  Goal of Therapy:  Heparin level 0.3-0.7 units/ml Monitor platelets by anticoagulation protocol: Yes   Plan:  Continue heparin drip at current rate Recheck level with 11/30 AM labs  Darlina Guys, Jacquenette Shone Crowford 06/26/2016,6:41 AM

## 2016-06-26 NOTE — Progress Notes (Signed)
PROGRESS NOTE  Richard [name] VBT:[id number] DOB: 1976-03-27 DOA: 06/17/2016 PCP: No primary care provider on file.  HPI/Recap of past 99 hours: 40 year old male with no past medical history admitted on 11/20 with shortness of breath and chest pain. At that time, patient was found to have multifocal PE and right ventricular strain. An echocardiogram done found severe cardio myopathy with ejection fraction of 10-15 percent as well as a left ventricular thrombus. Patient started on Lovenox and Coumadin and INR has been slowly trending upward.  Seen by cardiology and started on ARB and beta blocker although ARB has been on hold due to persistent hypotension.  This morning, blood pressure at 67 systolic to patient asymptomatic. Coreg held.  Patient given fluid bolus. Currently with no complaints. Denies any shortness of breath or chest pain  Assessment/Plan: Principal Problem:   Pulmonary embolism with infarction [facility]): Continue on heparin and Coumadin. Awaiting therapeutic INR greater than 2. Outpatient follow-up. Discussed with hematology. 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 No calcium noted in coronary arteries may be living in the walls. On Coumadin. Follow-up in CHF clinic.    Elevated troponin   Abnormal liver function tests: No signs of hepatitis. Likely in the setting of secondary hepatic congestion cardiomyopathy   NSVT (nonsustained ventricular tachycardia) (HCC)/frequent PVCs: Into the beta blocker Hypertension: We'll discuss with cardiology, I suspect we may have to discontinue his ARB or at the very least decrease the dose. He is not yet been able to started. We'll decrease Coreg down to 3.125 twice a day   Acute thrombus of left ventricle (HCC): On systemic anticoagulation.   Code Status: Full code   Family Communication: Girlfriend and sister at the  bedside   Disposition Plan: Potential discharge tomorrow if INR therapeutic and blood pressure stable    Consultants:  Cardiology   Procedures:  Echocardiogram done 11/24: Diffuse four-chamber enlargement. Diffuse hypokinesis with ejection fraction of 10-15 percent  Antimicrobials:  None  DVT prophylaxis:  On heparin   Objective: Vitals:   06/25/16 1016 06/25/16 1555 06/25/16 2206 06/26/16 0618  BP: 90/60 100/60 (!) 84/67 (!) 67/46  Pulse: 71 94 90 95  Resp:  18 18 20   Temp:  97.9 F (36.6 C) 98 F (36.7 C) 98.3 F (36.8 C)  TempSrc:  Oral Oral Oral  SpO2:  99% 100% 100%  Weight:    128 kg (282 lb 3 oz)  Height:        Intake/Output Summary (Last 24 hours) at 06/26/16 1551 Last data filed at 06/26/16 0600  Gross per 24 hour  Intake            352.5 ml  Output                0 ml  Net            352.5 ml   Filed Weights   06/24/16 0616 06/25/16 0512 06/26/16 0618  Weight: 130.1 kg (286 lb 13.1 oz) 129.8 kg (286 lb 2.5 oz) 128 kg (282 lb 3 oz)    Exam:   General:  Alert and oriented 3, no acute distress   Cardiovascular: Regular rate and rhythm, S1-S2   Respiratory: Clear to auscultation bilaterally   Abdomen: Soft, obese, nontender, positive bowel sounds   Musculoskeletal: No clubbing or cyanosis or edema   Skin: No skin breaks, tears or lesions  Psychiatry: Patient is appropriate,  no evidence of psychoses    Data Reviewed: CBC:  Recent Labs Lab 06/22/16 0531 06/23/16 0538 06/24/16 0526 06/25/16 0533 06/26/16 0549  WBC 4.6 5.1 4.8 4.3 4.2  HGB 13.4 13.7 13.3 13.2 12.7*  HCT 38.7* 40.0 38.9* 39.1 37.3*  MCV 84.1 84.6 83.5 83.4 84.0  PLT 230 245 248 271 276   Basic Metabolic Panel:  Recent Labs Lab 06/20/16 0316 06/21/16 0412 06/22/16 0531 06/23/16 0538  NA 136 135 136 136  K 4.1 4.0 4.1 4.3  CL 102 103 105 104  CO2 28 26 26 26   GLUCOSE 122* 115* 101* 109*  BUN 17 14 10 11   CREATININE 0.93 0.83 0.76 0.84  CALCIUM 8.0*  7.8* 7.8* 8.0*  MG  --   --  1.7 2.0   GFR: Estimated Creatinine Clearance: 179.4 mL/min (by C-G formula based on SCr of 0.84 mg/dL). Liver Function Tests:  Recent Labs Lab 06/20/16 0316 06/21/16 0412 06/23/16 0538  AST 47* 33 26  ALT 30 26 23   ALKPHOS 61 63 63  BILITOT 1.5* 1.3* 1.3*  PROT 6.1* 5.8* 5.8*  ALBUMIN 2.3* 2.1* 2.3*   No results for input(s): LIPASE, AMYLASE in the last 168 hours. No results for input(s): AMMONIA in the last 168 hours. Coagulation Profile:  Recent Labs Lab 06/23/16 0538 06/24/16 0526 06/25/16 0533 06/25/16 1259 06/26/16 0549  INR 1.88 2.00 1.71 1.80 1.95   Cardiac Enzymes: No results for input(s): CKTOTAL, CKMB, CKMBINDEX, TROPONINI in the last 168 hours. BNP (last 3 results) No results for input(s): PROBNP in the last 8760 hours. HbA1C: No results for input(s): HGBA1C in the last 72 hours. CBG: No results for input(s): GLUCAP in the last 168 hours. Lipid Profile: No results for input(s): CHOL, HDL, LDLCALC, TRIG, CHOLHDL, LDLDIRECT in the last 72 hours. Thyroid Function Tests: No results for input(s): TSH, T4TOTAL, FREET4, T3FREE, THYROIDAB in the last 72 hours. Anemia Panel: No results for input(s): VITAMINB12, FOLATE, FERRITIN, TIBC, IRON, RETICCTPCT in the last 72 hours. Urine analysis: No results found for: COLORURINE, APPEARANCEUR, LABSPEC, PHURINE, GLUCOSEU, HGBUR, BILIRUBINUR, KETONESUR, PROTEINUR, UROBILINOGEN, NITRITE, LEUKOCYTESUR Sepsis Labs: @LABRCNTIP (procalcitonin:4,lacticidven:4)  ) Recent Results (from the past 240 hour(s))  MRSA PCR Screening     Status: None   Collection Time: 06/17/16  6:34 AM  Result Value Ref Range Status   MRSA by PCR NEGATIVE NEGATIVE Final    Comment:        The GeneXpert MRSA Assay (FDA approved for NASAL specimens only), is one component of a comprehensive MRSA colonization surveillance program. It is not intended to diagnose MRSA infection nor to guide or monitor treatment  for MRSA infections.       Studies: No results found.  Scheduled Meds: . carvedilol  6.25 mg Oral BID WC  . guaiFENesin  600 mg Oral BID  . irbesartan  75 mg Oral Daily  . sodium chloride flush  3 mL Intravenous Q12H  . Warfarin - Pharmacist Dosing Inpatient   Does not apply q1800    Continuous Infusions: . heparin 2,350 Units/hr (06/26/16 0826)     LOS: 9 days    Hollice EspyKRISHNAN,Sharlena Kristensen K, MD [facility] Pager [phone]  If 7PM-7AM, please contact night-coverage [email or web address] Password [email or web address] 06/26/2016, 3:51 PM

## 2016-06-27 LAB — CBC
HCT: 37.2 % — ABNORMAL LOW (ref 39.0–52.0)
Hemoglobin: 12.6 g/dL — ABNORMAL LOW (ref 13.0–17.0)
MCH: 28.4 pg (ref 26.0–34.0)
MCHC: 33.9 g/dL (ref 30.0–36.0)
MCV: 84 fL (ref 78.0–100.0)
PLATELETS: 266 10*3/uL (ref 150–400)
RBC: 4.43 MIL/uL (ref 4.22–5.81)
RDW: 15.8 % — AB (ref 11.5–15.5)
WBC: 4.1 10*3/uL (ref 4.0–10.5)

## 2016-06-27 LAB — HEPARIN LEVEL (UNFRACTIONATED): HEPARIN UNFRACTIONATED: 0.7 [IU]/mL (ref 0.30–0.70)

## 2016-06-27 LAB — PROTIME-INR
INR: 2.35
PROTHROMBIN TIME: 26.1 s — AB (ref 11.4–15.2)

## 2016-06-27 LAB — ANGIOTENSIN CONVERTING ENZYME: ANGIOTENSIN-CONVERTING ENZYME: 17 U/L (ref 14–82)

## 2016-06-27 MED ORDER — WARFARIN SODIUM 5 MG PO TABS: 10.0000 mg | ORAL_TABLET | Freq: Every day | ORAL | 3 refills | Status: DC

## 2016-06-27 MED ORDER — CARVEDILOL 3.125 MG PO TABS: 3.1250 mg | ORAL_TABLET | Freq: Two times a day (BID) | ORAL | 2 refills | Status: DC

## 2016-06-27 MED ORDER — WARFARIN SODIUM 5 MG PO TABS: 10.0000 mg | ORAL_TABLET | Freq: Every day | ORAL | Status: DC

## 2016-06-27 MED ORDER — IRBESARTAN 75 MG PO TABS
37.5000 mg | ORAL_TABLET | Freq: Every day | ORAL | Status: DC
  Filled 2016-06-27: qty 1

## 2016-06-27 MED ORDER — CARVEDILOL 3.125 MG PO TABS
3.1250 mg | ORAL_TABLET | Freq: Two times a day (BID) | ORAL | Status: DC
  Administered 2016-06-27: 3.125 mg via ORAL
  Filled 2016-06-27: qty 1

## 2016-06-27 NOTE — Progress Notes (Signed)
ANTICOAGULATION CONSULT NOTE  Pharmacy Consult for Heparin, Warfarin Indication: multiple bilateral pulmonary embolism, acute thrombus of left ventricle, RLE DVT  No Known Allergies  Patient Measurements: Height: 6\' 7"  (200.7 cm) Weight: 281 lb 12 oz (127.8 kg) IBW/kg (Calculated) : 93.7 Heparin Dosing Weight: actual body weight  Vital Signs: Temp: 97.9 F (36.6 C) (11/30 0627) Temp Source: Oral (11/30 0627) BP: 105/81 (11/30 0627) Pulse Rate: 106 (11/30 0627)  Labs:  Recent Labs  06/25/16 0533 06/25/16 1259 06/25/16 1813 06/26/16 0549 06/27/16 0539  HGB 13.2  --   --  12.7* 12.6*  HCT 39.1  --   --  37.3* 37.2*  PLT 271  --   --  276 266  LABPROT 20.3* 21.1*  --  22.5* 26.1*  INR 1.71 1.80  --  1.95 2.35  HEPARINUNFRC <0.10* 0.26* 0.67 0.45 0.70    Estimated Creatinine Clearance: 179.2 mL/min (by C-G formula based on SCr of 0.84 mg/dL).   Medical History: History reviewed. No pertinent past medical history.  Medications:  No meds PTA  Assessment: 40 yr male with no significant PMH presents with complaint of shortness of breath and chest pain.  Recently completed course of Zpack with no resolution of symptoms.  Reports hemoptysis which had been worsening. Pharmacy asked to assist with dosing of heparin infusion on admission. Warfarin added on 11/22.  Significant events:  11/19: CTAngio shows bilateral pulmonary embolism with possible right heart strain  11/20: Echo shows left ventricular apex thrombus  11/22: Doppler + for RLE DVT  Today, 06/24/2016:  Day#9 Heparin-->Warfarin overlap.   Heparin level  therapeutic on heparin infusion at 2350 units/hr  INR >2 today, although still rising to desired upper end of goal range (2.5-3.0)  Patient denies green-leafy vegetables although some change in diet as patient has been getting take-out from outside hospital.    CrCl: > 100 ml/min  CBC: WNL, stable.  Hemoptysis resolved  No significant drug-drug  interactions  Patient re-educated on importance of consistent diet.  Goal of Therapy: Heparin level 0.3-0.7 units/ml Monitor platelets by anticoagulation protocol: Yes INR 2.5-3 (per Cardiology recommendations)  Plan:  Continue IV heparin infusion at 2350 units/hr until discharge  Warfarin 10mg  po daily until can follow-up for outpt INR monitoring  Daily PT/INR, heparin level, CBC  Monitor closely for bleeding, hemoptysis  Junita Push, PharmD, BCPS Pager: [phone] 06/27/2016 8:26 AM

## 2016-06-27 NOTE — Care Management Note (Signed)
Case Management Note  Patient Details  Name: Richard [name] MRN: [id number] Date of Birth: [birth year]  Subjective/Objective: Patient aware to make own pcp appt-patient voiced udnerstanding.Has $4 med list-already goes to [facility] in [place].No further CM needs.  Otpt f/u cardiology-they will manage.                 Action/Plan:d/c home.   Expected Discharge Date:   (unknown)               Expected Discharge Plan:  Home/Self Care  In-House Referral:     Discharge planning Services  CM Consult, Medication Assistance, Indigent Health Clinic  Post Acute Care Choice:    Choice offered to:     DME Arranged:    DME Agency:     HH Arranged:    HH Agency:     Status of Service:  Completed, signed off  If discussed at [employer] of Stay Meetings, dates discussed:    Additional Comments:  Lanier Clam, RN 06/27/2016, 12:00 PM

## 2016-06-27 NOTE — Discharge Summary (Signed)
Discharge Summary  Charlton HawsWilliam Youtsey EXB:[id number] DOB: [birth year]  PCP: No primary care provider on file./patient has follow-up appointment with Tonye BecketAmy Clegg, nurse practitioner for cardiology  Admit date: 06/17/2016 Discharge date: 06/27/2016  Time spent: 25 minutes   Recommendations for Outpatient Follow-up:  1. New medication: Coreg 3.125 by mouth twice a day 2. New medication: Coumadin 10 mg by mouth daily at bedtime  Patient [facility] on 12/5 INR check and on 12/8 with Amy Clegg for cardiology follow-upharge Diagnoses:  Active Hospital Problems   Diagnosis Date Noted  . Pulmonary embolism with infarction (HCC) 06/17/2016  . Hemoptysis 06/18/2016  . Cardiomyopathy (HCC) 06/18/2016  . Elevated troponin 06/18/2016  . Abnormal liver function tests 06/18/2016  . NSVT (nonsustained ventricular tachycardia) (HCC) 06/18/2016  . Frequent PVCs 06/18/2016  . Acute thrombus of left ventricle (HCC) 06/18/2016    Resolved Hospital Problems   Diagnosis Date Noted Date Resolved  No resolved problems to display.    Discharge Condition: Improved, being discharged home    Diet recommendation: Heart healthy  Diet recommendation: 06/27/16 0627 06/27/16 0911  BP: 105/81 97/84  Pulse: (!) 106 97  Resp: 18   Temp: 97.9 F (36.6 C)     History of present illness:  [age] year old male with no past medical history admitted on 11/20 with shortness of breath and chest pain. At that time, patient was found to have multifocal PE and right ventricular strain.  He was admitted to the hospitalist service.  Hospital Course:  Principal Problem:   Pulmonary embolism with infarction [facility](HCC) with secondary hemoptysis: Patient started on Lovenox bridge plus Coumadin. INR trending upward. By 11/30, INR therapeutic at 2.35. Pharmacy recommended patient be discharged on 10 mg Coumadin daily at bedtime. He is a follow-up appointment at cardiology office on 12/5 for repeat INR  check. Active Problems:   Dilated Cardiomyopathy [facility](HCC): Patient had an echocardiogram done to evaluate strain from PE. He was found have severe cardiomyopathy with ejection fraction of 10-15% as well as a left ventricular thrombus. Cardiology consulted. Patient was started on beta blocker as well as an ARB. Due to episodes of hypotension, the ARB was not able to be started. Beta blocker, Coreg, was titrated down to 3.125 so patient's blood pressure could tolerate It. Plan will be for patient to follow-up with cardiology that time the pressure be rechecked to see if he can have ARB added   Elevated troponin: No signs of acute ischemia. Felt to be secondary to pulmonary issues   Abnormal liver function tests: No signs of hepatitis. Likely in the setting of secondary hepatic congestion from cardiomyopathy   NSVT (nonsustained ventricular tachycardia) (HCC)/Frequent PVCs: On beta blocker    Acute thrombus of left ventricle (HCC): On systemic anticoagulation   Consultants:  Cardiology   Procedures:  Echocardiogram done 11/24: Diffuse four-chamber enlargement. Diffuse hypokinesis with ejection fraction of 10-15 percent  Discharge Exam: BP 97/84 (BP Location: Right Arm)   Pulse 97   Temp 97.9 F (36.6 C) (Oral)   Resp 18   Ht 6\' 7"  (2.007 m)   Wt 127.8 kg (281 lb 12 oz)   SpO2 98%   BMI 31.74 kg/m   General: Alert recent 3, no acute distress Cardiovascular:  regular rate and rhythm, S1-S2  Respiratory:  clear to auscultation bilaterally   Discharge Instructions You were cared for by a hospitalist during your hospital stay. If you have any questions about your discharge medications or the care you received  while you were in the hospital after you are discharged, you can call the unit and asked to speak with the hospitalist on call if the hospitalist that took care of you is not available. Once you are discharged, your primary care physician will handle any further medical issues. Please  note that NO REFILLS for any discharge medications will be authorized once you are discharged, as it is imperative that you return to your primary care physician (or establish a relationship with a primary care physician if you do not have one) for your aftercare needs so that they can reassess your need for medications and monitor your lab values.  Discharge Instructions    Diet - low sodium heart healthy    Complete by:  As directed    Increase activity slowly    Complete by:  As directed        Medication List    TAKE these medications   carvedilol 3.125 MG tablet Commonly known as:  COREG Take 1 tablet (3.125 mg total) by mouth 2 (two) times daily with a meal.   warfarin 5 MG tablet Commonly known as:  COUMADIN Take 2 tablets (10 mg total) by mouth daily at 6 PM.      No Known Allergies Follow-up Information    [facility] [employer] [facility] Follow up on 07/02/2016.   Specialty:  Cardiology Why:  Appointment for Coumadin INR Check on 07/02/2016 at 10:30AM. Contact information: [address] [place] [address] [phone]       Tonye Becket, NP Follow up on 07/05/2016.   Specialty:  Cardiology Why:  [facility] on 07/05/2016 at 9:20AM Contact information: [address] [place] [address] [phone]            The results of significant diagnostics from this hospitalization (including imaging, microbiology, ancillary and laboratory) are listed below for reference.    Significant Diagnostic Studies: Dg Chest 2 View  Result Date: 06/17/2016 CLINICAL DATA:  Chest pain, hemoptysis and vomiting EXAM: CHEST  2 VIEW COMPARISON:  Chest radiograph 05/13/2016 FINDINGS: Massive enlargement of the cardiomediastinal silhouette is unchanged. No pleural effusion or pneumothorax. There is right middle lobe atelectasis. The left lung is clear. No pulmonary edema. IMPRESSION: 1. Right middle lobe collapse.  Superimposed infection would be difficult to exclude. 2. Unchanged massive cardiomegaly versus pericardial effusion. Electronically Signed   By: Deatra Robinson M.D.   On: 06/17/2016 04:20   Ct Angio Chest Pe W And/or Wo Contrast  Result Date: 06/17/2016 CLINICAL DATA:  Right chest pain radiating to the back. Pneumonia last month. Symptoms have not responded to antibiotics. Blood in sputum. Shortness of breath, tachycardia, hypoxia. EXAM: CT ANGIOGRAPHY CHEST WITH CONTRAST TECHNIQUE: Multidetector CT imaging of the chest was performed using the standard protocol during bolus administration of intravenous contrast. Multiplanar CT image reconstructions and MIPs were obtained to evaluate the vascular anatomy. CONTRAST:  100 mL Isovue 370 COMPARISON:  None. FINDINGS: Cardiovascular: Filling defects in the right lower lobe and left lingular pulmonary arteries consistent with pulmonary emboli. Cardiac enlargement. RV to LV ratio is upper limits of normal at 0.88. There is reflux of contrast material into the hepatic veins. Changes may indicate evidence of right heart strain. No aortic aneurysm. No pericardial effusion. Mediastinum/Nodes: Enlarged lymph nodes demonstrated throughout the mediastinum. Largest node is in the aortopulmonic window, measuring 15 mm short axis dimension. Nodes are likely reactive. Esophagus is decompressed. Lungs/Pleura: Focal areas  of airspace disease demonstrated in the left lingula, left lower lung, and right lower lung. These changes may represent multifocal pneumonia or infarcts. Small left pleural effusion. No pneumothorax. Upper Abdomen: No acute abnormality. Musculoskeletal: Degenerative changes in the spine. No destructive bone lesions. Review of the MIP images confirms the above findings. IMPRESSION: Positive for multiple bilateral pulmonary emboli involving segmental and subsegmental branches. Borderline RV to LV ratio of 0.88 and reflux of contrast material into the hepatic veins  suggests possibility of right heart strain. Focal areas of airspace disease in the right lower lobe and left upper lung may indicate pulmonary infarcts or multifocal pneumonia. Small left pleural effusion. These results were called by telephone at the time of interpretation on 06/17/2016 at 5:28 am to PA [facility] , who verbally acknowledged these results. Electronically Signed   By: Burman Nieves M.D.   On: 06/17/2016 05:35    Microbiology: No results found for this or any previous visit (from the past 240 hour(s)).   Labs: Basic Metabolic Panel:  Recent Labs Lab 06/21/16 0412 06/22/16 0531 06/23/16 0538  NA 135 136 136  K 4.0 4.1 4.3  CL 103 105 104  CO2 26 26 26   GLUCOSE 115* 101* 109*  BUN 14 10 11   CREATININE 0.83 0.76 0.84  CALCIUM 7.8* 7.8* 8.0*  MG  --  1.7 2.0   Liver Function Tests:  Recent Labs Lab 06/21/16 0412 06/23/16 0538  AST 33 26  ALT 26 23  ALKPHOS 63 63  BILITOT 1.3* 1.3*  PROT 5.8* 5.8*  ALBUMIN 2.1* 2.3*   No results for input(s): LIPASE, AMYLASE in the last 168 hours. No results for input(s): AMMONIA in the last 168 hours. CBC:  Recent Labs Lab 06/23/16 0538 06/24/16 0526 06/25/16 0533 06/26/16 0549 06/27/16 0539  WBC 5.1 4.8 4.3 4.2 4.1  HGB 13.7 13.3 13.2 12.7* 12.6*  HCT 40.0 38.9* 39.1 37.3* 37.2*  MCV 84.6 83.5 83.4 84.0 84.0  PLT 245 248 271 276 266   Cardiac Enzymes: No results for input(s): CKTOTAL, CKMB, CKMBINDEX, TROPONINI in the last 168 hours. BNP: BNP (last 3 results) No results for input(s): BNP in the last 8760 hours.  ProBNP (last 3 results) No results for input(s): PROBNP in the last 8760 hours.  CBG: No results for input(s): GLUCAP in the last 168 hours.     Signed:  Hollice Espy, MD [facility] 06/27/2016, 12:40 PM

## 2016-07-02 ENCOUNTER — Ambulatory Visit (INDEPENDENT_AMBULATORY_CARE_PROVIDER_SITE_OTHER): Payer: Self-pay | Admitting: *Deleted

## 2016-07-02 DIAGNOSIS — I2699 Other pulmonary embolism without acute cor pulmonale: Secondary | ICD-10-CM

## 2016-07-02 DIAGNOSIS — Z5181 Encounter for therapeutic drug level monitoring: Secondary | ICD-10-CM | POA: Insufficient documentation

## 2016-07-02 DIAGNOSIS — I240 Acute coronary thrombosis not resulting in myocardial infarction: Secondary | ICD-10-CM

## 2016-07-02 DIAGNOSIS — I213 ST elevation (STEMI) myocardial infarction of unspecified site: Secondary | ICD-10-CM

## 2016-07-02 LAB — POCT INR: INR: 3.4

## 2016-07-02 NOTE — Patient Instructions (Signed)

## 2016-07-05 ENCOUNTER — Ambulatory Visit (HOSPITAL_COMMUNITY)
Admission: RE | Admit: 2016-07-05 | Discharge: 2016-07-05 | Disposition: A | Discharge status: Home / Self Care | Payer: Medicaid Other | Source: Ambulatory Visit | Attending: Cardiology | Admitting: Cardiology

## 2016-07-05 VITALS — BP 102/76 | HR 103 | Wt 280.5 lb

## 2016-07-05 DIAGNOSIS — I428 Other cardiomyopathies: Secondary | ICD-10-CM | POA: Diagnosis present

## 2016-07-05 DIAGNOSIS — I2699 Other pulmonary embolism without acute cor pulmonale: Secondary | ICD-10-CM

## 2016-07-05 DIAGNOSIS — I5022 Chronic systolic (congestive) heart failure: Secondary | ICD-10-CM

## 2016-07-05 LAB — BASIC METABOLIC PANEL
ANION GAP: 10 (ref 5–15)
BUN: 6 mg/dL (ref 6–20)
CHLORIDE: 106 mmol/L (ref 101–111)
CO2: 21 mmol/L — ABNORMAL LOW (ref 22–32)
Calcium: 8.8 mg/dL — ABNORMAL LOW (ref 8.9–10.3)
Creatinine, Ser: 1.06 mg/dL (ref 0.61–1.24)
GFR calc non Af Amer: >60 mL/min (ref >60)
Glucose, Bld: 107 mg/dL — ABNORMAL HIGH (ref 65–99)
POTASSIUM: 4.9 mmol/L (ref 3.5–5.1)
SODIUM: 137 mmol/L (ref 135–145)

## 2016-07-05 LAB — BRAIN NATRIURETIC PEPTIDE: B NATRIURETIC PEPTIDE 5: 1112.5 pg/mL — AB (ref 0.0–100.0)

## 2016-07-05 MED ORDER — SPIRONOLACTONE 25 MG PO TABS: 12.5000 mg | ORAL_TABLET | Freq: Every day | ORAL | 3 refills | Status: DC

## 2016-07-05 MED ORDER — DIGOXIN 125 MCG PO TABS: 0.1250 mg | ORAL_TABLET | Freq: Every day | ORAL | 3 refills | Status: DC

## 2016-07-05 NOTE — Progress Notes (Signed)
PCP: None Primary HF Cardiologist: Dr Gala RomneyBensimhon  HPI: [name] s a 40 year old recenltly diagnosed with bilateral PEs and new diagnosed cardiomyopathy in November 2017.   In September he was evaluated and treated for pneumonia. Treated with antibiotics. [place] felt terrible. He continued to complain of increased dyspnea. [facility]. He was treated for pneumonia again. He continued to struggle at home and [facility]. At that point he was diagnosed with bilateral PE and acute systolic heart failure. He was later discharged on coumadin and low dose carvedilol. He was intolerant ARB due to hypotension.   Today he presents as a new patient at the request of Dr Clifton JamesMcAlhany. Complaining of ongoing dyspnea with exertion. +orthopnea . Sleeping on 2 pillows.  Denies dizziness. Denies hemoptysis. No BRBPR. Taking all medications.   ECHO 05/2016: EF 10-15%. RV dilated LV thrombus    FH: Negative for heart disease SH: No alcohol or drug use.   ROS: All systems negative except as listed in HPI, PMH and Problem List.  SH:  Social History   Social History  . Marital status: Single    Spouse name: N/A  . Number of children: N/A  . Years of education: N/A   Occupational History  . Not on file.   Social History Main Topics  . Smoking status: Never Smoker  . Smokeless tobacco: Never Used  . Alcohol use Yes     Comment: rare  . Drug use: No  . Sexual activity: Not on file   Other Topics Concern  . Not on file   Social History Narrative  . No narrative on file    FH:  Family History  Problem Relation Age of Onset  . Other Mother     Neg family history of clotting disorder or heart disease  . Clotting disorder Neg Hx   . Rheumatologic disease Neg Hx     No past medical history on file.  Current Outpatient Prescriptions  Medication Sig Dispense Refill  . carvedilol (COREG) 3.125 MG tablet Take 1 tablet  (3.125 mg total) by mouth 2 (two) times daily with a meal. 180 tablet 2  . warfarin (COUMADIN) 5 MG tablet Take 2 tablets (10 mg total) by mouth daily at 6 PM. 60 tablet 3   No current facility-administered medications for this encounter.     Vitals:   07/05/16 0935  BP: 102/76  Pulse: (!) 103  SpO2: 99%  Weight: 280 lb 8 oz (127.2 kg)    PHYSICAL EXAM: General:  Well appearing. No resp difficulty HEENT: normal Neck: supple. JVP 8-9. Carotids 2+ bilaterally; no bruits. No lymphadenopathy or thryomegaly appreciated. Cor: PMI normal. Regular rate & rhythm. No rubs, or murmurs. Soft +S3  Lungs: clear Abdomen: soft, nontender, nondistended. No hepatosplenomegaly. No bruits or masses. Good bowel sounds. Extremities: no cyanosis, clubbing, rash, edema Neuro: alert & orientedx3, cranial nerves grossly intact. Moves all 4 extremities w/o difficulty. Affect pleasant.   ASSESSMENT & PLAN: 1. Bilateral PE - CTA 05/2016  2. Chronic Systolic Heart Failure- ECHO 06/17/16 EF 10-15%. With LV thrombus. 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 NYHA IIIb. Volume status stable. Add 12.5 mg spiro daily.  Continue carvedilol 3.125 mg twice a day. Add 0.125 mg dig daily. Add 12.5 mg spiro daily.  HF meds limited by soft SBP.  He will need CMRI when has insurance.  3. Large LV thrombus-  noted on ECHO on 05/2016 on coumadin. [facility].  4.  Bilateral  PE 5. PVCs/NSVT-    Check BMET and BNP today. Follow up in 2 weeks.   Amy Clegg NP-C  10:26 AM   Patient seen and examined with Tonye Becket, NP. We discussed all aspects of the encounter. I agree with the assessment and plan as stated above.   40 y/o with probable NICM, EF 10-15%. Much improved symptomatically. Long talk about potential etiologies and natural history of HF. Agree with med changes as above. Will need cMRI. If EF not improving will need cath at some point. Continue coumadin for PE and LV thrombus.   Total time spent  45 minutes. Over half that time spent discussing above.   Bensimhon, Daniel,MD 10:10 AM

## 2016-07-05 NOTE — Patient Instructions (Signed)
Start Digoxin 0.125 mg daily  Start Spironolactone 12.5 mg (1/2 tab) daily  Labs today  Your physician recommends that you schedule a follow-up appointment in: 2 weeks with Cicero Duck, PharmD  Your physician recommends that you schedule a follow-up appointment in: 4 weeks

## 2016-07-09 ENCOUNTER — Ambulatory Visit (INDEPENDENT_AMBULATORY_CARE_PROVIDER_SITE_OTHER): Payer: Self-pay | Admitting: Pharmacist

## 2016-07-09 DIAGNOSIS — I213 ST elevation (STEMI) myocardial infarction of unspecified site: Secondary | ICD-10-CM

## 2016-07-09 DIAGNOSIS — Z5181 Encounter for therapeutic drug level monitoring: Secondary | ICD-10-CM

## 2016-07-09 DIAGNOSIS — I240 Acute coronary thrombosis not resulting in myocardial infarction: Secondary | ICD-10-CM

## 2016-07-09 DIAGNOSIS — I2699 Other pulmonary embolism without acute cor pulmonale: Secondary | ICD-10-CM

## 2016-07-09 LAB — POCT INR: INR: 5.5

## 2016-07-16 ENCOUNTER — Ambulatory Visit (INDEPENDENT_AMBULATORY_CARE_PROVIDER_SITE_OTHER): Payer: Self-pay | Admitting: Pharmacist

## 2016-07-16 DIAGNOSIS — I2699 Other pulmonary embolism without acute cor pulmonale: Secondary | ICD-10-CM

## 2016-07-16 DIAGNOSIS — I213 ST elevation (STEMI) myocardial infarction of unspecified site: Secondary | ICD-10-CM

## 2016-07-16 DIAGNOSIS — I240 Acute coronary thrombosis not resulting in myocardial infarction: Secondary | ICD-10-CM

## 2016-07-16 DIAGNOSIS — Z5181 Encounter for therapeutic drug level monitoring: Secondary | ICD-10-CM

## 2016-07-16 LAB — POCT INR: INR: 4.4

## 2016-07-18 ENCOUNTER — Encounter (HOSPITAL_COMMUNITY): Payer: Self-pay

## 2016-07-18 ENCOUNTER — Ambulatory Visit (HOSPITAL_COMMUNITY)
Admission: RE | Admit: 2016-07-18 | Discharge: 2016-07-18 | Disposition: A | Discharge status: Home / Self Care | Payer: Medicaid Other | Source: Ambulatory Visit | Attending: Internal Medicine | Admitting: Internal Medicine

## 2016-07-18 VITALS — BP 100/88 | HR 124 | Ht 79.0 in | Wt 276.4 lb

## 2016-07-18 DIAGNOSIS — I472 Ventricular tachycardia: Secondary | ICD-10-CM | POA: Diagnosis not present

## 2016-07-18 DIAGNOSIS — I2699 Other pulmonary embolism without acute cor pulmonale: Secondary | ICD-10-CM | POA: Insufficient documentation

## 2016-07-18 DIAGNOSIS — Z7901 Long term (current) use of anticoagulants: Secondary | ICD-10-CM | POA: Diagnosis not present

## 2016-07-18 DIAGNOSIS — I5022 Chronic systolic (congestive) heart failure: Secondary | ICD-10-CM | POA: Insufficient documentation

## 2016-07-18 DIAGNOSIS — Z79899 Other long term (current) drug therapy: Secondary | ICD-10-CM | POA: Insufficient documentation

## 2016-07-18 DIAGNOSIS — I429 Cardiomyopathy, unspecified: Secondary | ICD-10-CM | POA: Insufficient documentation

## 2016-07-18 DIAGNOSIS — I428 Other cardiomyopathies: Secondary | ICD-10-CM

## 2016-07-18 MED ORDER — SACUBITRIL-VALSARTAN 24-26 MG PO TABS: 1.0000 | ORAL_TABLET | Freq: Two times a day (BID) | ORAL | 6 refills | Status: DC

## 2016-07-18 NOTE — Patient Instructions (Signed)
START Entresto 24/26 mg, one tab twice a day  If you become dizzy or lightheaded please give our office for further instructions  Labs today We will only contact you if something comes back abnormal or we need to make some changes. Otherwise no news is good news!   Your physician recommends that you schedule a follow-up appointment in: 2 weeks with Amy Clegg,NP  Do the following things EVERYDAY: 1) Weigh yourself in the morning before breakfast. Write it down and keep it in a log. 2) Take your medicines as prescribed 3) Eat low salt foods-Limit salt (sodium) to 2000 mg per day.  4) Stay as active as you can everyday 5) Limit all fluids for the day to less than 2 liters

## 2016-07-18 NOTE — Progress Notes (Signed)
PCP: None Primary HF Cardiologist: Dr Gala RomneyBensimhon  HPI: [name] s a 40 year old recenltly diagnosed with bilateral PEs and new diagnosed cardiomyopathy in November 2017.   In September he was evaluated and treated for pneumonia. Treated with antibiotics. [place] able [place] [facility] but felt terrible. He continued to complain of increased dyspnea. He again returned for treatment at [facility]. He was treated for pneumonia again. He continued to struggle at home and went [facility]. At that point he was diagnosed with bilateral PE and acute systolic heart failure. He was later discharged on coumadin and low dose carvedilol. He was intolerant ARB due to hypotension.   Today he presents for HF follow up. Last visit he was started on digoxin and spiro. Mild dyspnea with exertion with long walks. Able to walk up steps. Denies PND/Orthopnea. No BRBPR. Taking all medications. Denies dizziness. Not weighing at home. Denies hemoptysis. Taking all medications.   ECHO 05/2016: EF 10-15%. RV dilated LV thrombus    FH: Negative for heart disease SH: No alcohol or drug use.   ROS: All systems negative except as listed in HPI, PMH and Problem List.  SH:  Social History   Social History  . Marital status: Single    Spouse name: N/A  . Number of children: N/A  . Years of education: N/A   Occupational History  . Not on file.   Social History Main Topics  . Smoking status: Never Smoker  . Smokeless tobacco: Never Used  . Alcohol use Yes     Comment: rare  . Drug use: No  . Sexual activity: Not on file   Other Topics Concern  . Not on file   Social History Narrative  . No narrative on file    FH:  Family History  Problem Relation Age of Onset  . Other Mother     Neg family history of clotting disorder or heart disease  . Clotting disorder Neg Hx   . Rheumatologic disease Neg Hx     No past medical history on file.  Current Outpatient Prescriptions  Medication Sig  Dispense Refill  . carvedilol (COREG) 3.125 MG tablet Take 1 tablet (3.125 mg total) by mouth 2 (two) times daily with a meal. 180 tablet 2  . digoxin (LANOXIN) 0.125 MG tablet Take 1 tablet (0.125 mg total) by mouth daily. 30 tablet 3  . spironolactone (ALDACTONE) 25 MG tablet Take 0.5 tablets (12.5 mg total) by mouth daily. 15 tablet 3  . warfarin (COUMADIN) 5 MG tablet Take 2 tablets (10 mg total) by mouth daily at 6 PM. 60 tablet 3   No current facility-administered medications for this encounter.     Vitals:   07/18/16 0906  BP: 100/88  Pulse: (!) 124  Weight: 276 lb 6.4 oz (125.4 kg)  Height: 6\' 7"  (2.007 m)    PHYSICAL EXAM: General:  Well appearing. No resp difficulty HEENT: normal Neck: supple. JVP 8-9. Carotids 2+ bilaterally; no bruits. No lymphadenopathy or thryomegaly appreciated. Cor: PMI normal. Regular rate & rhythm. No rubs, or murmurs. Soft +S3  Lungs: clear Abdomen: soft, nontender, nondistended. No hepatosplenomegaly. No bruits or masses. Good bowel sounds. Extremities: no cyanosis, clubbing, rash, R and LLE trace-1+ edema Neuro: alert & orientedx3, cranial nerves grossly intact. Moves all 4 extremities w/o difficulty. Affect pleasant.  EKG NSR 96 bpm    ASSESSMENT & PLAN: 1. Bilateral PE - CTA 05/2016  2. Chronic Systolic Heart Failure-  ECHO 06/17/16 EF 10-15%. With LV thrombus. 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 NYHA II-IIIb.  Volume status stable. Continue 12.5 mg spiro daily.  Continue carvedilol 3.125 mg twice a day.  Continue 0.125 mg dig daily.  Add 24-26 mg entresto twice a day.  He will need CMRI when has insurance.  Plan to repeat ECHO after HF meds optimized around the end of February.  3. Large LV thrombus- noted on ECHO on 05/2016 on coumadin. [facility].  4.  Bilateral  PE 5. PVCs/NSVT-    Follow up in 2 weeks. Plan to increase bb next visit. Check BMET at that time. Tonye Becket NP-C  9:26 AM

## 2016-07-24 ENCOUNTER — Ambulatory Visit (INDEPENDENT_AMBULATORY_CARE_PROVIDER_SITE_OTHER): Payer: Self-pay | Admitting: *Deleted

## 2016-07-24 DIAGNOSIS — I213 ST elevation (STEMI) myocardial infarction of unspecified site: Secondary | ICD-10-CM

## 2016-07-24 DIAGNOSIS — I2699 Other pulmonary embolism without acute cor pulmonale: Secondary | ICD-10-CM

## 2016-07-24 DIAGNOSIS — I240 Acute coronary thrombosis not resulting in myocardial infarction: Secondary | ICD-10-CM

## 2016-07-24 DIAGNOSIS — Z5181 Encounter for therapeutic drug level monitoring: Secondary | ICD-10-CM

## 2016-07-24 LAB — POCT INR: INR: 3.1

## 2016-08-01 ENCOUNTER — Ambulatory Visit (INDEPENDENT_AMBULATORY_CARE_PROVIDER_SITE_OTHER): Payer: Self-pay | Admitting: Pharmacist

## 2016-08-01 ENCOUNTER — Ambulatory Visit (HOSPITAL_COMMUNITY)
Admission: RE | Admit: 2016-08-01 | Discharge: 2016-08-01 | Disposition: A | Discharge status: Home / Self Care | Payer: Medicaid Other | Source: Ambulatory Visit | Attending: Internal Medicine | Admitting: Internal Medicine

## 2016-08-01 VITALS — BP 104/70 | HR 100 | Wt 273.8 lb

## 2016-08-01 DIAGNOSIS — I513 Intracardiac thrombosis, not elsewhere classified: Secondary | ICD-10-CM | POA: Insufficient documentation

## 2016-08-01 DIAGNOSIS — Z7901 Long term (current) use of anticoagulants: Secondary | ICD-10-CM | POA: Diagnosis not present

## 2016-08-01 DIAGNOSIS — I240 Acute coronary thrombosis not resulting in myocardial infarction: Secondary | ICD-10-CM

## 2016-08-01 DIAGNOSIS — I429 Cardiomyopathy, unspecified: Secondary | ICD-10-CM | POA: Diagnosis not present

## 2016-08-01 DIAGNOSIS — I2699 Other pulmonary embolism without acute cor pulmonale: Secondary | ICD-10-CM | POA: Insufficient documentation

## 2016-08-01 DIAGNOSIS — I5022 Chronic systolic (congestive) heart failure: Secondary | ICD-10-CM | POA: Insufficient documentation

## 2016-08-01 DIAGNOSIS — Z5181 Encounter for therapeutic drug level monitoring: Secondary | ICD-10-CM

## 2016-08-01 DIAGNOSIS — I493 Ventricular premature depolarization: Secondary | ICD-10-CM | POA: Insufficient documentation

## 2016-08-01 DIAGNOSIS — I213 ST elevation (STEMI) myocardial infarction of unspecified site: Secondary | ICD-10-CM

## 2016-08-01 DIAGNOSIS — I472 Ventricular tachycardia: Secondary | ICD-10-CM | POA: Insufficient documentation

## 2016-08-01 DIAGNOSIS — Z79899 Other long term (current) drug therapy: Secondary | ICD-10-CM | POA: Insufficient documentation

## 2016-08-01 DIAGNOSIS — I428 Other cardiomyopathies: Secondary | ICD-10-CM

## 2016-08-01 LAB — BASIC METABOLIC PANEL
ANION GAP: 7 (ref 5–15)
BUN: 7 mg/dL (ref 6–20)
CO2: 27 mmol/L (ref 22–32)
Calcium: 8.6 mg/dL — ABNORMAL LOW (ref 8.9–10.3)
Chloride: 107 mmol/L (ref 101–111)
Creatinine, Ser: 1.02 mg/dL (ref 0.61–1.24)
GFR calc non Af Amer: >60 mL/min (ref >60)
GLUCOSE: 106 mg/dL — AB (ref 65–99)
POTASSIUM: 3.8 mmol/L (ref 3.5–5.1)
Sodium: 141 mmol/L (ref 135–145)

## 2016-08-01 LAB — POCT INR: INR: 2.6

## 2016-08-01 MED ORDER — CARVEDILOL 6.25 MG PO TABS: 6.2500 mg | ORAL_TABLET | Freq: Two times a day (BID) | ORAL | 3 refills | Status: DC

## 2016-08-01 NOTE — Patient Instructions (Signed)
INCREASE Carvedilol (Coreg) to 6.25 mg twice daily. Can "double up" on your current 3.125 mg tablets at home (Take 2 tabs twice daily)--Label updated on your bottle. New Rx has been sent to your pharmacy for 6.25 mg tablets (Take 1 tablet twice daily)--Will have directions on your new bottle.  Routine lab work today. Will notify you of abnormal results, otherwise no news is good news!  Follow up 2 weeks with Amy Clegg NP-C.  Do the following things EVERYDAY: 1) Weigh yourself in the morning before breakfast. Write it down and keep it in a log. 2) Take your medicines as prescribed 3) Eat low salt foods-Limit salt (sodium) to 2000 mg per day.  4) Stay as active as you can everyday 5) Limit all fluids for the day to less than 2 liters

## 2016-08-01 NOTE — Progress Notes (Signed)
PCP: None Primary HF Cardiologist: Dr Richard [name]  HPI: [name] s a 41 year old recenltly diagnosed with bilateral PEs and new diagnosed cardiomyopathy in November 2017.   In September he was evaluated and treated for pneumonia. Treated with antibiotics. He was able ot travel to [facility] but felt terrible. He continued to complain of increased dyspnea. He again returned for treatment at [facility]. He was treated for pneumonia again. He continued to struggle at home and went to [facility] ED. At that point he was diagnosed with bilateral PE and acute systolic heart failure. He was later discharged on coumadin and low dose carvedilol. He was intolerant ARB due to hypotension.   Today he presents for HF follow up. Last visit entresto started. Overall feeling better.  Mild dyspnea with exertion. Denies PND/Orthopnea. Not weighing at home. Appetite picking up.  No BRBPR. Taking all medications.   ECHO 05/2016: EF 10-15%. RV dilated LV thrombus    FH: Negative for heart disease SH: No alcohol or drug use.   ROS: All systems negative except as listed in HPI, PMH and Problem List.  SH:  Social History   Social History  . Marital status: Single    Spouse name: N/A  . Number of children: N/A  . Years of education: N/A   Occupational History  . Not on file.   Social History Main Topics  . Smoking status: Never Smoker  . Smokeless tobacco: Never Used  . Alcohol use Yes     Comment: rare  . Drug use: No  . Sexual activity: Not on file   Other Topics Concern  . Not on file   Social History Narrative  . No narrative on file    FH:  Family History  Problem Relation Age of Onset  . Other Mother     Neg family history of clotting disorder or heart disease  . Clotting disorder Neg Hx   . Rheumatologic disease Neg Hx     No past medical history on file.  Current Outpatient Prescriptions  Medication Sig Dispense Refill  . carvedilol (COREG) 3.125 MG tablet Take 1 tablet  (3.125 mg total) by mouth 2 (two) times daily with a meal. 180 tablet 2  . digoxin (LANOXIN) 0.125 MG tablet Take 1 tablet (0.125 mg total) by mouth daily. 30 tablet 3  . sacubitril-valsartan (ENTRESTO) 24-26 MG Take 1 tablet by mouth 2 (two) times daily. 60 tablet 6  . spironolactone (ALDACTONE) 25 MG tablet Take 0.5 tablets (12.5 mg total) by mouth daily. 15 tablet 3  . warfarin (COUMADIN) 5 MG tablet Take 2 tablets (10 mg total) by mouth daily at 6 PM. 60 tablet 3   No current facility-administered medications for this encounter.     Vitals:   08/01/16 0908  BP: 104/70  Pulse: 100  SpO2: 98%  Weight: 273 lb 12.8 oz (124.2 kg)    PHYSICAL EXAM: General:  Well appearing. No resp difficulty HEENT: normal Neck: supple. JVP 8-9. Carotids 2+ bilaterally; no bruits. No lymphadenopathy or thryomegaly appreciated. Cor: PMI normal. Regular rate & rhythm. No rubs, or murmurs. Soft +S3  Lungs: clear Abdomen: soft, nontender, nondistended. No hepatosplenomegaly. No bruits or masses. Good bowel sounds. Extremities: no cyanosis, clubbing, rash, R and LLE trace  edema Neuro: alert & orientedx3, cranial nerves grossly intact. Moves all 4 extremities w/o difficulty. Affect pleasant.    ASSESSMENT & PLAN: 1. Bilateral PE - CTA 05/2016  2. Chronic Systolic Heart Failure- ECHO 06/17/16  EF 10-15%. With LV thrombus. 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 NYHA II-IIIb.  Volume status stable. Continue 12.5 mg spiro daily.  Increase carvedilol 6.25 mg twice a day. Continue digoxin 0.125 mg dig daily. Dig level next visit.  Continue 24-26 mg entresto twice a day.  He will need CMRI when has insurance.  BMET today.  Plan to repeat ECHO after HF meds optimized around the end of February.  3. Large LV thrombus- noted on ECHO on 05/2016 on coumadin. [facility].  4.  Bilateral  PE 5. PVCs/NSVT-   BMET today. Follow up in 2 weeks. Dig level next visit.   Evalene Vath NP-C  9:21 AM

## 2016-08-02 ENCOUNTER — Encounter (HOSPITAL_COMMUNITY): Payer: Self-pay

## 2016-08-08 ENCOUNTER — Ambulatory Visit (INDEPENDENT_AMBULATORY_CARE_PROVIDER_SITE_OTHER): Payer: Self-pay | Admitting: *Deleted

## 2016-08-08 DIAGNOSIS — I240 Acute coronary thrombosis not resulting in myocardial infarction: Secondary | ICD-10-CM

## 2016-08-08 DIAGNOSIS — Z5181 Encounter for therapeutic drug level monitoring: Secondary | ICD-10-CM

## 2016-08-08 DIAGNOSIS — I2699 Other pulmonary embolism without acute cor pulmonale: Secondary | ICD-10-CM

## 2016-08-08 DIAGNOSIS — I213 ST elevation (STEMI) myocardial infarction of unspecified site: Secondary | ICD-10-CM

## 2016-08-08 LAB — POCT INR: INR: 2.2

## 2016-08-15 ENCOUNTER — Encounter (HOSPITAL_COMMUNITY): Payer: Self-pay

## 2016-08-22 ENCOUNTER — Ambulatory Visit (HOSPITAL_COMMUNITY)
Admission: RE | Admit: 2016-08-22 | Discharge: 2016-08-22 | Disposition: A | Discharge status: Home / Self Care | Payer: Medicaid Other | Source: Ambulatory Visit | Attending: Cardiology | Admitting: Cardiology

## 2016-08-22 ENCOUNTER — Ambulatory Visit (INDEPENDENT_AMBULATORY_CARE_PROVIDER_SITE_OTHER): Payer: Self-pay | Admitting: *Deleted

## 2016-08-22 ENCOUNTER — Encounter (HOSPITAL_COMMUNITY): Payer: Self-pay

## 2016-08-22 VITALS — BP 128/96 | HR 87 | Wt 276.2 lb

## 2016-08-22 DIAGNOSIS — I5022 Chronic systolic (congestive) heart failure: Secondary | ICD-10-CM | POA: Diagnosis not present

## 2016-08-22 DIAGNOSIS — I240 Acute coronary thrombosis not resulting in myocardial infarction: Secondary | ICD-10-CM

## 2016-08-22 DIAGNOSIS — I4729 Other ventricular tachycardia: Secondary | ICD-10-CM

## 2016-08-22 DIAGNOSIS — I493 Ventricular premature depolarization: Secondary | ICD-10-CM | POA: Insufficient documentation

## 2016-08-22 DIAGNOSIS — I472 Ventricular tachycardia: Secondary | ICD-10-CM | POA: Diagnosis not present

## 2016-08-22 DIAGNOSIS — I2699 Other pulmonary embolism without acute cor pulmonale: Secondary | ICD-10-CM

## 2016-08-22 DIAGNOSIS — Z79899 Other long term (current) drug therapy: Secondary | ICD-10-CM | POA: Diagnosis not present

## 2016-08-22 DIAGNOSIS — I213 ST elevation (STEMI) myocardial infarction of unspecified site: Secondary | ICD-10-CM

## 2016-08-22 DIAGNOSIS — I513 Intracardiac thrombosis, not elsewhere classified: Secondary | ICD-10-CM

## 2016-08-22 DIAGNOSIS — Z7901 Long term (current) use of anticoagulants: Secondary | ICD-10-CM | POA: Diagnosis not present

## 2016-08-22 DIAGNOSIS — Z5181 Encounter for therapeutic drug level monitoring: Secondary | ICD-10-CM

## 2016-08-22 DIAGNOSIS — I429 Cardiomyopathy, unspecified: Secondary | ICD-10-CM | POA: Diagnosis not present

## 2016-08-22 DIAGNOSIS — I428 Other cardiomyopathies: Secondary | ICD-10-CM

## 2016-08-22 LAB — BASIC METABOLIC PANEL
Anion gap: 7 (ref 5–15)
BUN: 6 mg/dL (ref 6–20)
CALCIUM: 8.6 mg/dL — AB (ref 8.9–10.3)
CO2: 24 mmol/L (ref 22–32)
CREATININE: 1.09 mg/dL (ref 0.61–1.24)
Chloride: 106 mmol/L (ref 101–111)
Glucose, Bld: 102 mg/dL — ABNORMAL HIGH (ref 65–99)
Potassium: 3.9 mmol/L (ref 3.5–5.1)
SODIUM: 137 mmol/L (ref 135–145)

## 2016-08-22 LAB — BRAIN NATRIURETIC PEPTIDE: B NATRIURETIC PEPTIDE 5: 1458.9 pg/mL — AB (ref 0.0–100.0)

## 2016-08-22 LAB — DIGOXIN LEVEL: DIGOXIN LVL: 0.4 ng/mL — AB (ref 0.8–2.0)

## 2016-08-22 LAB — POCT INR: INR: 2.3

## 2016-08-22 MED ORDER — SACUBITRIL-VALSARTAN 24-26 MG PO TABS: 1.0000 | ORAL_TABLET | Freq: Two times a day (BID) | ORAL | 3 refills | Status: DC

## 2016-08-22 NOTE — Progress Notes (Signed)
CSW referred to assist patient with insurance options. Patient reports he was hospitalized back in the Fall and made application for mediciad during hospital stay. He states he has not received anything from social services and was told his application is "pending" at MD offices. CSW discussed returning to social services to inquire about pending medicaid as well as make application for [employer]. Patient verbalizes understanding of follow up and will contact CSW if further needs arise. Lasandra Beech, LCSW, CCSW-MCS [phone]

## 2016-08-22 NOTE — Progress Notes (Signed)
Advanced Heart Failure Clinic Note   PCP: None Primary HF Cardiologist: Dr Gala Romney  HPI: [name] s a 41 year old recenltly diagnosed with bilateral PEs and new diagnosed cardiomyopathy in November 2017.   In September he was evaluated and treated for pneumonia. Treated with antibiotics. He was able ot travel to [facility] but felt terrible. He continued to complain of increased dyspnea. He again returned for treatment at [facility]. He was treated for pneumonia again. He continued to struggle at home and went to [facility] ED. At that point he was diagnosed with bilateral PE and acute systolic heart failure. He was later discharged on coumadin and low dose carvedilol. He was intolerant ARB due to hypotension.   He presents today for regular follow up. At last visit coreg increased. Has been out of Entresto since Thursday 08/15/16. Up 3 lbs from last visit. Hasn't been breathing a little more heavy and having some cough. Mild dyspnea with exertion. Occasionally orthopneic and PND. Denies lightheadedness or dizziness.  Doesn't weigh at home.  Plans on starting back at the gym on Feb 1st. Not working currently.  Appetite is OK. No epistaxis, BRBPR, or melena on coumadin. Had a GI bug over the weekend and had N/V for several days.   ECHO 05/2016: EF 10-15%. RV dilated LV thrombus    FH: Negative for heart disease SH: No alcohol or drug use.   ROS: All systems negative except as listed in HPI, PMH and Problem List.  SH:  Social History   Social History  . Marital status: Single    Spouse name: N/A  . Number of children: N/A  . Years of education: N/A   Occupational History  . Not on file.   Social History Main Topics  . Smoking status: Never Smoker  . Smokeless tobacco: Never Used  . Alcohol use Yes     Comment: rare  . Drug use: No  . Sexual activity: Not on file   Other Topics Concern  . Not on file   Social History Narrative  . No narrative on file    FH:  Family  History  Problem Relation Age of Onset  . Other Mother     Neg family history of clotting disorder or heart disease  . Clotting disorder Neg Hx   . Rheumatologic disease Neg Hx     No past medical history on file.  Current Outpatient Prescriptions  Medication Sig Dispense Refill  . carvedilol (COREG) 6.25 MG tablet Take 1 tablet (6.25 mg total) by mouth 2 (two) times daily with a meal. 60 tablet 3  . digoxin (LANOXIN) 0.125 MG tablet Take 1 tablet (0.125 mg total) by mouth daily. 30 tablet 3  . sacubitril-valsartan (ENTRESTO) 24-26 MG Take 1 tablet by mouth 2 (two) times daily. 60 tablet 6  . spironolactone (ALDACTONE) 25 MG tablet Take 0.5 tablets (12.5 mg total) by mouth daily. 15 tablet 3  . warfarin (COUMADIN) 5 MG tablet Take 2 tablets (10 mg total) by mouth daily at 6 PM. 60 tablet 3   No current facility-administered medications for this encounter.     Vitals:   08/22/16 0845  BP: (!) 128/96  Pulse: 87  SpO2: 99%  Weight: 276 lb 3.2 oz (125.3 kg)   Wt Readings from Last 3 Encounters:  08/22/16 276 lb 3.2 oz (125.3 kg)  08/01/16 273 lb 12.8 oz (124.2 kg)  07/18/16 276 lb 6.4 oz (125.4 kg)    PHYSICAL EXAM: General:  WDWN. NAD  HEENT: Normal  Neck: supple. JVP 7-8 cm. Carotids 2+ bilaterally; no bruits. No thyromegaly or nodule noted.  Cor: PMI normal. RRR. No rubs or murmurs. Soft +S3   Lungs: CTAB, normal effort Abdomen: soft, NT, ND, no HSM. No bruits or masses. +BS  Extremities: no cyanosis, clubbing, rash, Trace ankle edema at most.  Neuro: alert & orientedx3, cranial nerves grossly intact. Moves all 4 extremities w/o difficulty. Affect pleasant.   ASSESSMENT & PLAN: 1. Bilateral PE - CTA 05/2016  2. Chronic Systolic Heart Failure- ECHO 06/17/16 EF 10-15%. With LV thrombus. 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 NYHA II-IIIb.  - Volume status looks stable on exam.  - Continue spironolactone 12.5 mg daily. - Resume Entresto 24-26 mg BID. Will give samples  today.  - Continue digoxin 0.125 mg daily. Check level this am.  - He will need CMRI when has insurance.   - Plan to repeat ECHO after HF meds optimized. Late February early Marcg ideally.  3. Large LV thrombus- noted on ECHO on 05/2016 on coumadin. -  [facility]R [facility].  No change to current plan.  4.  Bilateral  PE 5. PVCs/NSVT - NSR by exam today.   Graciella Freer, PA-C  8:47 AM

## 2016-08-22 NOTE — Patient Instructions (Signed)
RESTART Entresto 24/26 mg, one tab twice a day  Labs today We will only contact you if something comes back abnormal or we need to make some changes. Otherwise no news is good news!   Your physician recommends that you schedule a follow-up appointment in: 2 weeks with Joanell Rising  Do the following things EVERYDAY: 1) Weigh yourself in the morning before breakfast. Write it down and keep it in a log. 2) Take your medicines as prescribed 3) Eat low salt foods-Limit salt (sodium) to 2000 mg per day.  4) Stay as active as you can everyday 5) Limit all fluids for the day to less than 2 liters

## 2016-08-22 NOTE — Addendum Note (Signed)
Encounter addended by: Marcy Siren, LCSW on: 08/22/2016 12:26 PM<BR>    Actions taken: Sign clinical note

## 2016-09-05 ENCOUNTER — Ambulatory Visit (INDEPENDENT_AMBULATORY_CARE_PROVIDER_SITE_OTHER): Payer: Self-pay | Admitting: *Deleted

## 2016-09-05 ENCOUNTER — Ambulatory Visit (HOSPITAL_COMMUNITY)
Admission: RE | Admit: 2016-09-05 | Discharge: 2016-09-05 | Disposition: A | Discharge status: Home / Self Care | Payer: Medicaid Other | Source: Ambulatory Visit | Attending: Cardiology | Admitting: Cardiology

## 2016-09-05 ENCOUNTER — Encounter (HOSPITAL_COMMUNITY): Payer: Self-pay | Admitting: Pharmacist

## 2016-09-05 VITALS — BP 119/86 | HR 93 | Wt 256.0 lb

## 2016-09-05 DIAGNOSIS — I2699 Other pulmonary embolism without acute cor pulmonale: Secondary | ICD-10-CM | POA: Diagnosis not present

## 2016-09-05 DIAGNOSIS — I213 ST elevation (STEMI) myocardial infarction of unspecified site: Secondary | ICD-10-CM

## 2016-09-05 DIAGNOSIS — I472 Ventricular tachycardia: Secondary | ICD-10-CM

## 2016-09-05 DIAGNOSIS — I5022 Chronic systolic (congestive) heart failure: Secondary | ICD-10-CM | POA: Diagnosis present

## 2016-09-05 DIAGNOSIS — Z79899 Other long term (current) drug therapy: Secondary | ICD-10-CM | POA: Insufficient documentation

## 2016-09-05 DIAGNOSIS — I429 Cardiomyopathy, unspecified: Secondary | ICD-10-CM | POA: Diagnosis not present

## 2016-09-05 DIAGNOSIS — Z7901 Long term (current) use of anticoagulants: Secondary | ICD-10-CM | POA: Diagnosis not present

## 2016-09-05 DIAGNOSIS — I240 Acute coronary thrombosis not resulting in myocardial infarction: Secondary | ICD-10-CM

## 2016-09-05 DIAGNOSIS — I493 Ventricular premature depolarization: Secondary | ICD-10-CM | POA: Insufficient documentation

## 2016-09-05 DIAGNOSIS — I513 Intracardiac thrombosis, not elsewhere classified: Secondary | ICD-10-CM | POA: Insufficient documentation

## 2016-09-05 DIAGNOSIS — Z5181 Encounter for therapeutic drug level monitoring: Secondary | ICD-10-CM

## 2016-09-05 DIAGNOSIS — I4729 Other ventricular tachycardia: Secondary | ICD-10-CM

## 2016-09-05 LAB — BASIC METABOLIC PANEL
Anion gap: 8 (ref 5–15)
BUN: 6 mg/dL (ref 6–20)
CO2: 26 mmol/L (ref 22–32)
CREATININE: 0.86 mg/dL (ref 0.61–1.24)
Calcium: 9.2 mg/dL (ref 8.9–10.3)
Chloride: 106 mmol/L (ref 101–111)
Glucose, Bld: 112 mg/dL — ABNORMAL HIGH (ref 65–99)
Potassium: 4 mmol/L (ref 3.5–5.1)
SODIUM: 140 mmol/L (ref 135–145)

## 2016-09-05 LAB — BRAIN NATRIURETIC PEPTIDE: B NATRIURETIC PEPTIDE 5: 869.7 pg/mL — AB (ref 0.0–100.0)

## 2016-09-05 LAB — POCT INR: INR: 1.8

## 2016-09-05 MED ORDER — SACUBITRIL-VALSARTAN 24-26 MG PO TABS: 1.0000 | ORAL_TABLET | Freq: Two times a day (BID) | ORAL | 11 refills | Status: DC

## 2016-09-05 MED ORDER — SPIRONOLACTONE 25 MG PO TABS: 25.0000 mg | ORAL_TABLET | Freq: Every day | ORAL | 6 refills | Status: DC

## 2016-09-05 NOTE — Progress Notes (Addendum)
Advanced Heart Failure Clinic Note   PCP: None Primary HF Cardiologist: Dr Gala Romney  HPI: Richard [name] is a 41 y.o. male diagnosed with bilateral PEs and Cardiomyopathy in 05/2016.   In September he was evaluated and treated for pneumonia. Treated with antibiotics. He was able ot travel to [facility] but felt terrible. He continued to complain of increased dyspnea. He again returned for treatment at [facility]. He was treated for pneumonia again. He continued to struggle at home and went to [facility] ED. At that point he was diagnosed with bilateral PE and acute systolic heart failure. He was later discharged on coumadin and low dose carvedilol. He was intolerant ARB due to hypotension.   He presents today for regular follow up.  At last visit given Entresto samples and no med changes as he had been out for 1 week. Weight is down 20 lbs from last visit.  Weight at home ~ 260-265 lbs.  States he hasn't taken any medications yet this am.  Breathing has been "fine". Can walk as far as he wants on flat ground.  Avoids stairs. Wants to start back in the gym this month. Denies CP, lightheadedness, dizziness, or orthopnea.  Denies bleeding on coumadin.   ECHO 05/2016: EF 10-15%. RV dilated LV thrombus    FH: Negative for heart disease SH: No alcohol or drug use.   ROS: All systems negative except as listed in HPI, PMH and Problem List.  SH:  Social History   Social History  . Marital status: Single    Spouse name: N/A  . Number of children: N/A  . Years of education: N/A   Occupational History  . Not on file.   Social History Main Topics  . Smoking status: Never Smoker  . Smokeless tobacco: Never Used  . Alcohol use Yes     Comment: rare  . Drug use: No  . Sexual activity: Not on file   Other Topics Concern  . Not on file   Social History Narrative  . No narrative on file    FH:  Family History  Problem Relation Age of Onset  . Other Mother     Neg family history of  clotting disorder or heart disease  . Clotting disorder Neg Hx   . Rheumatologic disease Neg Hx     No past medical history on file.  Current Outpatient Prescriptions  Medication Sig Dispense Refill  . carvedilol (COREG) 6.25 MG tablet Take 1 tablet (6.25 mg total) by mouth 2 (two) times daily with a meal. 60 tablet 3  . digoxin (LANOXIN) 0.125 MG tablet Take 1 tablet (0.125 mg total) by mouth daily. 30 tablet 3  . sacubitril-valsartan (ENTRESTO) 24-26 MG Take 1 tablet by mouth 2 (two) times daily. 60 tablet 3  . spironolactone (ALDACTONE) 25 MG tablet Take 0.5 tablets (12.5 mg total) by mouth daily. 15 tablet 3  . warfarin (COUMADIN) 5 MG tablet Take 2 tablets (10 mg total) by mouth daily at 6 PM. 60 tablet 3   No current facility-administered medications for this encounter.    Vitals:   09/05/16 0929  BP: 119/86  Pulse: 93  SpO2: 100%  Weight: 256 lb (116.1 kg)   Wt Readings from Last 3 Encounters:  09/05/16 256 lb (116.1 kg)  08/22/16 276 lb 3.2 oz (125.3 kg)  08/01/16 273 lb 12.8 oz (124.2 kg)    PHYSICAL EXAM: General:  Well appearing. Tall.  HEENT: Normal  Neck: supple. JVP 6-7 cm.  Carotids 2+ bilaterally; no bruits. No thyromegaly or nodule noted.  Cor: PMI normal. RRR. No rubs or murmurs. Soft +S3   Lungs: Clear, normal effort Abdomen: soft, NT, ND, no HSM. No bruits or masses. +BS  Extremities: no cyanosis, clubbing, rash, No peripheral edema noted.   Neuro: alert & orientedx3, cranial nerves grossly intact. Moves all 4 extremities w/o difficulty. Affect pleasant.  ASSESSMENT & PLAN: 1. Bilateral PE - CTA 05/2016  - Continue coumadin. INR per coumadin clinic. No change to current plan.  2. Chronic Systolic Heart Failure- ECHO 06/17/16 EF 10-15%. With LV thrombus. 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 NYHA II-IIIb.  - Volume status good on exam. Weight down 20 lbs from last visit.  Pt thinks closer to 10 lbs on his home scale.  - Increase spironolactone 25 mg  daily. BMET today and repeat 7-10 days.  - Continue Entresto 24-26 mg BID. Will give samples today. Paperwork completed in clinic today.  Manufacturer will contact him via phone to set up shipping and further will be mailed to his home.  - Continue digoxin 0.125 mg daily. Check level this am.  - He will need CMRI when has insurance.   - Needs repeat Echo with ongoing med optimization.  Will plan for 6 weeks.   - Reinforced fluid restriction to < 2 L daily, sodium restriction to less than 2000 mg daily, and the importance of daily weights.  3. Large LV thrombus- noted on ECHO on 05/2016 on coumadin. -  INR followed [facility].  No change.  4. PVCs/NSVT - NSR by exam today.   Meds and labs as above. Follow up 3 weeks with Pharm-D. Follow up 6 weeks on MD side with Echo.   Graciella Freer, PA-C  9:38 AM   Total time spent > 25 minutes. Over half that spent discussing the above.

## 2016-09-05 NOTE — Patient Instructions (Addendum)
Routine lab work today. Will notify you of abnormal results, otherwise no news is good news!  INCREASE Spironolactone to 25 mg (1 whole tablet) once daily.  Return in 7-10 days for lab work.  ___________________________________________________________  ___________________________________________________________  Follow up with Elizabeth Palau CHF Clinical Pharmacist in 3 weeks.  ___________________________________________________________  ___________________________________________________________  Follow up with echocardiogram and appointment with Dr. Gala Romney in 6 weeks.  Do the following things EVERYDAY: 1) Weigh yourself in the morning before breakfast. Write it down and keep it in a log. 2) Take your medicines as prescribed 3) Eat low salt foods-Limit salt (sodium) to 2000 mg per day.  4) Stay as active as you can everyday 5) Limit all fluids for the day to less than 2 liters

## 2016-09-06 ENCOUNTER — Telehealth (HOSPITAL_COMMUNITY): Payer: Self-pay | Admitting: Pharmacist

## 2016-09-06 NOTE — Telephone Encounter (Signed)
[employer] patient assistance approved for [employer] 24-26 mg BID through 09/05/17.   Tyler Deis. Bonnye Fava, PharmD, BCPS, CPP Clinical Pharmacist Pager: [phone] Phone: [phone] 09/06/2016 2:13 PM

## 2016-09-12 ENCOUNTER — Ambulatory Visit (HOSPITAL_COMMUNITY)
Admission: RE | Admit: 2016-09-12 | Discharge: 2016-09-12 | Disposition: A | Discharge status: Home / Self Care | Payer: Medicaid Other | Source: Ambulatory Visit | Attending: Internal Medicine | Admitting: Internal Medicine

## 2016-09-12 DIAGNOSIS — I5022 Chronic systolic (congestive) heart failure: Secondary | ICD-10-CM | POA: Diagnosis present

## 2016-09-12 LAB — BASIC METABOLIC PANEL
Anion gap: 7 (ref 5–15)
BUN: 6 mg/dL (ref 6–20)
CHLORIDE: 106 mmol/L (ref 101–111)
CO2: 28 mmol/L (ref 22–32)
Calcium: 9.2 mg/dL (ref 8.9–10.3)
Creatinine, Ser: 0.92 mg/dL (ref 0.61–1.24)
GFR calc Af Amer: >60 mL/min (ref >60)
GFR calc non Af Amer: >60 mL/min (ref >60)
Glucose, Bld: 99 mg/dL (ref 65–99)
Potassium: 3.9 mmol/L (ref 3.5–5.1)
SODIUM: 141 mmol/L (ref 135–145)

## 2016-09-19 ENCOUNTER — Ambulatory Visit (INDEPENDENT_AMBULATORY_CARE_PROVIDER_SITE_OTHER): Payer: Self-pay

## 2016-09-19 DIAGNOSIS — I240 Acute coronary thrombosis not resulting in myocardial infarction: Secondary | ICD-10-CM

## 2016-09-19 DIAGNOSIS — Z5181 Encounter for therapeutic drug level monitoring: Secondary | ICD-10-CM

## 2016-09-19 DIAGNOSIS — I2699 Other pulmonary embolism without acute cor pulmonale: Secondary | ICD-10-CM

## 2016-09-19 DIAGNOSIS — I213 ST elevation (STEMI) myocardial infarction of unspecified site: Secondary | ICD-10-CM

## 2016-09-19 LAB — POCT INR: INR: 1.5

## 2016-09-25 ENCOUNTER — Ambulatory Visit (HOSPITAL_COMMUNITY)
Admission: RE | Admit: 2016-09-25 | Discharge: 2016-09-25 | Disposition: A | Discharge status: Home / Self Care | Payer: Medicaid Other | Source: Ambulatory Visit | Attending: Internal Medicine | Admitting: Internal Medicine

## 2016-09-25 DIAGNOSIS — I5022 Chronic systolic (congestive) heart failure: Secondary | ICD-10-CM | POA: Diagnosis not present

## 2016-09-25 DIAGNOSIS — I240 Acute coronary thrombosis not resulting in myocardial infarction: Secondary | ICD-10-CM | POA: Diagnosis not present

## 2016-09-25 DIAGNOSIS — I493 Ventricular premature depolarization: Secondary | ICD-10-CM | POA: Diagnosis not present

## 2016-09-25 DIAGNOSIS — I2699 Other pulmonary embolism without acute cor pulmonale: Secondary | ICD-10-CM | POA: Diagnosis not present

## 2016-09-25 MED ORDER — SACUBITRIL-VALSARTAN 49-51 MG PO TABS: 1.0000 | ORAL_TABLET | Freq: Two times a day (BID) | ORAL | 11 refills | Status: DC

## 2016-09-25 NOTE — Patient Instructions (Signed)
It was great to see you today!  Please INCREASE Entresto to 49-51 mg TWICE DAILY. You can take 2 of your 24-26 mg tablets TWICE DAILY until you run out.   Please keep your appointment with Dr. Gala Romney on 10/22/16.

## 2016-09-25 NOTE — Progress Notes (Signed)
HF MD: Richard [name]  HPI:  Richard [name] is a 41 y.o. AA male diagnosed with bilateral PEs and Cardiomyopathy in 05/2016.   In September he was evaluated and treated for pneumonia. Treated with antibiotics. He was able ot travel to [facility] but felt terrible. He continued to complain of increased dyspnea. He again returned for treatment at [facility]. He was treated for pneumonia again. He continued to struggle at home and went to [facility] ED. At that point he was diagnosed with bilateral PE and acute systolic heart failure. He was later discharged on coumadin and low dose carvedilol. He was intolerant ARB due to hypotension.   He presents today for pharmacist-led HF medication titration. At last HF clinic visit on 09/05/16, his spironolactone was increased to 25 mg daily. States he has been compliant with all of his medications and has started receiving Entresto through [employer] patient assistance foundation. Feels great and can walk on flat ground as far as he wants without getting short of breath.   . Shortness of breath/dyspnea on exertion? no  . Orthopnea/PND? no . Edema? no . Lightheadedness/dizziness? no . Daily weights at home? Yes - stable ~260-265 lb  . Blood pressure/heart rate monitoring at home? no . Following low-sodium/fluid-restricted diet? No - does admit to eating fried foods but is trying not to drink >2L fluid/day since he thinks this is what contributed to his last hospitalization  HF Medications: Carvedilol 6.25 mg PO BID Digoxin 0.125 mg PO daily Entresto 24-26 mg PO BID Spironolactone 25 mg PO daily    Has the patient been exper260iencing any side effects to the medications prescribed?  no  Does the patient have any problems obtaining medications due to transportation or finances?   No - meds are affordable through [employer], [employer] patient assistance for [employer] and still working on [employer] of regimen: good Understanding of indications:  good Potential of compliance: good Patient understands to avoid NSAIDs. Patient understands to avoid decongestants.    Pertinent Lab Values: . 09/12/16: Serum creatinine 0.92, BUN 6, Potassium 3.9, Sodium 141 . 09/05/16: BNP 869 . 08/22/16: Digoxin 0.4   Vital Signs: . Weight: 260 lb (dry weight: 256 lb) . Blood pressure: 130/92 mmHg  . Heart rate: 79 bpm    Assessment: 1. 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 NYHA class I-IIsymptoms.  - Volume status stable  - Increase Entresto to 49-51 mg BID  - Continue carvedilol 6.25 mg BID, digoxin 0.125 mg daily and spironolactone 25 mg daily  - We did discuss the benefits and eventual addition of Bidil but would like to optimize current regimen first with history of non-compliance and cost issues with medications - Basic disease state pathophysiology, medication indication, mechanism and side effects reviewed at length with patient and he verbalized understanding 2. Bilateral PE (CTA 05/2016)  - Continue Coumadin per Coumadin Clinic at [facility] 3. Large LV thrombus (ECHO 05/2016)  - Continue Coumadin as above 4. PVCs/NSVT  - NSR recently   Plan: 1) Medication changes: Based on clinical presentation, vital signs and recent labs will increase Entresto to 49-51 mg BID 2) Labs: BMET at next HF clinic visit 3) Follow-up: Dr. Gala [name] on 10/22/16   Tyler Deis. Bonnye Fava, PharmD, BCPS, CPP Clinical Pharmacist Pager: [phone] Phone: [phone] 09/25/2016 1:19 PM    Agree with changes as above.    Nameer Summer,MD 8:34 PM

## 2016-09-26 ENCOUNTER — Other Ambulatory Visit (HOSPITAL_COMMUNITY): Payer: Self-pay | Admitting: *Deleted

## 2016-09-26 MED ORDER — SACUBITRIL-VALSARTAN 49-51 MG PO TABS: 1.0000 | ORAL_TABLET | Freq: Two times a day (BID) | ORAL | 11 refills | Status: DC

## 2016-10-03 ENCOUNTER — Ambulatory Visit (INDEPENDENT_AMBULATORY_CARE_PROVIDER_SITE_OTHER): Payer: Self-pay | Admitting: *Deleted

## 2016-10-03 DIAGNOSIS — I240 Acute coronary thrombosis not resulting in myocardial infarction: Secondary | ICD-10-CM

## 2016-10-03 DIAGNOSIS — I213 ST elevation (STEMI) myocardial infarction of unspecified site: Secondary | ICD-10-CM

## 2016-10-03 DIAGNOSIS — Z5181 Encounter for therapeutic drug level monitoring: Secondary | ICD-10-CM

## 2016-10-03 DIAGNOSIS — I2699 Other pulmonary embolism without acute cor pulmonale: Secondary | ICD-10-CM

## 2016-10-03 LAB — POCT INR: INR: 1.5

## 2016-10-11 ENCOUNTER — Other Ambulatory Visit (HOSPITAL_COMMUNITY): Payer: Self-pay | Admitting: Adult Health

## 2016-10-14 ENCOUNTER — Ambulatory Visit (INDEPENDENT_AMBULATORY_CARE_PROVIDER_SITE_OTHER): Payer: Self-pay | Admitting: *Deleted

## 2016-10-14 DIAGNOSIS — I2699 Other pulmonary embolism without acute cor pulmonale: Secondary | ICD-10-CM

## 2016-10-14 DIAGNOSIS — I213 ST elevation (STEMI) myocardial infarction of unspecified site: Secondary | ICD-10-CM

## 2016-10-14 DIAGNOSIS — I240 Acute coronary thrombosis not resulting in myocardial infarction: Secondary | ICD-10-CM

## 2016-10-14 DIAGNOSIS — Z5181 Encounter for therapeutic drug level monitoring: Secondary | ICD-10-CM

## 2016-10-14 LAB — POCT INR: INR: 1.7

## 2016-10-21 ENCOUNTER — Ambulatory Visit (INDEPENDENT_AMBULATORY_CARE_PROVIDER_SITE_OTHER): Payer: Self-pay | Admitting: *Deleted

## 2016-10-21 DIAGNOSIS — I213 ST elevation (STEMI) myocardial infarction of unspecified site: Secondary | ICD-10-CM

## 2016-10-21 DIAGNOSIS — Z5181 Encounter for therapeutic drug level monitoring: Secondary | ICD-10-CM

## 2016-10-21 DIAGNOSIS — I2699 Other pulmonary embolism without acute cor pulmonale: Secondary | ICD-10-CM

## 2016-10-21 DIAGNOSIS — I240 Acute coronary thrombosis not resulting in myocardial infarction: Secondary | ICD-10-CM

## 2016-10-21 LAB — POCT INR: INR: 2

## 2016-10-22 ENCOUNTER — Encounter (HOSPITAL_COMMUNITY): Payer: Self-pay | Admitting: Internal Medicine

## 2016-10-22 ENCOUNTER — Ambulatory Visit (HOSPITAL_COMMUNITY): Payer: Self-pay

## 2016-11-20 ENCOUNTER — Ambulatory Visit (HOSPITAL_COMMUNITY)
Admission: RE | Admit: 2016-11-20 | Discharge: 2016-11-20 | Disposition: A | Discharge status: Home / Self Care | Payer: Medicaid Other | Source: Ambulatory Visit | Attending: Internal Medicine | Admitting: Internal Medicine

## 2016-11-20 ENCOUNTER — Encounter (HOSPITAL_COMMUNITY): Payer: Self-pay | Admitting: Internal Medicine

## 2016-11-20 ENCOUNTER — Ambulatory Visit (HOSPITAL_BASED_OUTPATIENT_CLINIC_OR_DEPARTMENT_OTHER)
Admission: RE | Admit: 2016-11-20 | Discharge: 2016-11-20 | Disposition: A | Discharge status: Home / Self Care | Payer: Medicaid Other | Source: Ambulatory Visit | Attending: Internal Medicine | Admitting: Internal Medicine

## 2016-11-20 VITALS — BP 132/90 | HR 77 | Wt 271.2 lb

## 2016-11-20 DIAGNOSIS — I5022 Chronic systolic (congestive) heart failure: Secondary | ICD-10-CM | POA: Diagnosis not present

## 2016-11-20 DIAGNOSIS — I509 Heart failure, unspecified: Secondary | ICD-10-CM | POA: Diagnosis not present

## 2016-11-20 DIAGNOSIS — I2699 Other pulmonary embolism without acute cor pulmonale: Secondary | ICD-10-CM | POA: Diagnosis not present

## 2016-11-20 DIAGNOSIS — I240 Acute coronary thrombosis not resulting in myocardial infarction: Secondary | ICD-10-CM | POA: Diagnosis not present

## 2016-11-20 DIAGNOSIS — I517 Cardiomegaly: Secondary | ICD-10-CM | POA: Insufficient documentation

## 2016-11-20 DIAGNOSIS — I77810 Thoracic aortic ectasia: Secondary | ICD-10-CM | POA: Diagnosis not present

## 2016-11-20 LAB — CBC
HCT: 38.8 % — ABNORMAL LOW (ref 39.0–52.0)
HEMOGLOBIN: 13.2 g/dL (ref 13.0–17.0)
MCH: 28.9 pg (ref 26.0–34.0)
MCHC: 34 g/dL (ref 30.0–36.0)
MCV: 85.1 fL (ref 78.0–100.0)
PLATELETS: 234 10*3/uL (ref 150–400)
RBC: 4.56 MIL/uL (ref 4.22–5.81)
RDW: 14.9 % (ref 11.5–15.5)
WBC: 2.7 10*3/uL — ABNORMAL LOW (ref 4.0–10.5)

## 2016-11-20 LAB — BASIC METABOLIC PANEL
Anion gap: 8 (ref 5–15)
BUN: 10 mg/dL (ref 6–20)
CO2: 25 mmol/L (ref 22–32)
Calcium: 9.1 mg/dL (ref 8.9–10.3)
Chloride: 107 mmol/L (ref 101–111)
Creatinine, Ser: 0.77 mg/dL (ref 0.61–1.24)
GFR calc Af Amer: >60 mL/min (ref >60)
GFR calc non Af Amer: >60 mL/min (ref >60)
Glucose, Bld: 116 mg/dL — ABNORMAL HIGH (ref 65–99)
Potassium: 4 mmol/L (ref 3.5–5.1)
Sodium: 140 mmol/L (ref 135–145)

## 2016-11-20 LAB — PROTIME-INR
INR: 1.12
Prothrombin Time: 14.5 seconds (ref 11.4–15.2)

## 2016-11-20 MED ORDER — APIXABAN 5 MG PO TABS: 5.0000 mg | ORAL_TABLET | Freq: Two times a day (BID) | ORAL | 2 refills | Status: DC

## 2016-11-20 MED ORDER — CARVEDILOL 6.25 MG PO TABS: 6.2500 mg | ORAL_TABLET | Freq: Two times a day (BID) | ORAL | 3 refills | Status: DC

## 2016-11-20 MED ORDER — DIGOXIN 125 MCG PO TABS: 125.0000 ug | ORAL_TABLET | Freq: Every day | ORAL | 3 refills | Status: DC

## 2016-11-20 MED ORDER — APIXABAN 5 MG PO TABS: 5.0000 mg | ORAL_TABLET | Freq: Two times a day (BID) | ORAL | 6 refills | Status: DC

## 2016-11-20 MED ORDER — SPIRONOLACTONE 25 MG PO TABS: 25.0000 mg | ORAL_TABLET | Freq: Every day | ORAL | 6 refills | Status: DC

## 2016-11-20 NOTE — Patient Instructions (Addendum)
Stop Coumadin  Start Eliquis 5 mg Twice daily   Labs today  Your physician recommends that you schedule a follow-up appointment in: 1 month with Fara Chute D  Your physician recommends that you schedule a follow-up appointment in: 2 months

## 2016-11-20 NOTE — Addendum Note (Signed)
Encounter addended by: Noralee Space, RN on: 11/20/2016 10:56 AM<BR>    Actions taken: Order list changed

## 2016-11-20 NOTE — Progress Notes (Signed)
Advanced Heart Failure Clinic Note   PCP: None Primary HF Cardiologist: Dr Gala Romney  HPI: Richard [name] is a 41 y.o. male diagnosed with bilateral PEs and Cardiomyopathy in 05/2016.   In September he was evaluated and treated for pneumonia. Treated with antibiotics. He was able ot travel to [facility] but felt terrible. He continued to complain of increased dyspnea. He again returned for treatment at [facility]. He was treated for pneumonia again. He continued to struggle at home and went to [facility] ED. At that point he was diagnosed with bilateral PE and acute systolic heart failure. He was later discharged on coumadin and low dose carvedilol. He was intolerant ARB due to hypotension.   He presents today for regular follow up.  Saw PharmD recently and Entresto increased 49/51 bid.  Feels good. No SOB or CP. Getting around fine but has been running out of his medications. Cutting pills down to stretch them out. Has not had INR checked in almost a month. No bleeding or leg swelling. Weight stable at home 260-265. Lifting weights again.   ECHO today EF 25-30% Brobable small laminated LV thrombus   ECHO 05/2016: EF 10-15%. RV dilated LV thrombus    FH: Negative for heart disease SH: No alcohol or drug use.   ROS: All systems negative except as listed in HPI, PMH and Problem List.  SH:  Social History   Social History  . Marital status: Single    Spouse name: N/A  . Number of children: N/A  . Years of education: N/A   Occupational History  . Not on file.   Social History Main Topics  . Smoking status: Never Smoker  . Smokeless tobacco: Never Used  . Alcohol use Yes     Comment: rare  . Drug use: No  . Sexual activity: Not on file   Other Topics Concern  . Not on file   Social History Narrative  . No narrative on file    FH:  Family History  Problem Relation Age of Onset  . Other Mother     Neg family history of clotting disorder or heart disease  . Clotting  disorder Neg Hx   . Rheumatologic disease Neg Hx     No past medical history on file.  Current Outpatient Prescriptions  Medication Sig Dispense Refill  . carvedilol (COREG) 6.25 MG tablet Take 1 tablet (6.25 mg total) by mouth 2 (two) times daily with a meal. 60 tablet 3  . digoxin (LANOXIN) 0.125 MG tablet TAKE ONE TABLET BY MOUTH ONCE DAILY 30 tablet 3  . sacubitril-valsartan (ENTRESTO) 49-51 MG Take 1 tablet by mouth 2 (two) times daily. 60 tablet 11  . spironolactone (ALDACTONE) 25 MG tablet Take 1 tablet (25 mg total) by mouth daily. 30 tablet 6  . warfarin (COUMADIN) 5 MG tablet Take 2 tablets (10 mg total) by mouth daily at 6 PM. 60 tablet 3   No current facility-administered medications for this encounter.    Vitals:   11/20/16 0907  BP: 132/90  Pulse: 77  SpO2: 100%  Weight: 271 lb 4 oz (123 kg)   Wt Readings from Last 3 Encounters:  11/20/16 271 lb 4 oz (123 kg)  09/25/16 260 lb (117.9 kg)  09/05/16 256 lb (116.1 kg)    PHYSICAL EXAM: General:  Well appearing. No resp difficulty Muscular HEENT: normal Neck: supple. no JVD. Carotids 2+ bilat; no bruits. No lymphadenopathy or thryomegaly appreciated. Cor: PMI nondisplaced. Regular rate & rhythm.  No rubs, gallops or murmurs. Lungs: clear Abdomen: soft, nontender, nondistended. No hepatosplenomegaly. No bruits or masses. Good bowel sounds. Extremities: no cyanosis, clubbing, rash, edema Neuro: alert & orientedx3, cranial nerves grossly intact. moves all 4 extremities w/o difficulty. Affect pleasant   ASSESSMENT & PLAN: 1. Bilateral PE - CTA 05/2016  - Stressed need to be complaint with coumadin. Will talk will PharmD about if we can get NOAC 2. Chronic Systolic Heart Failure- ECHO 06/17/16 EF 10-15%. With LV thrombus. 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 Echo today reviewed personally. EF improving but still down 25-30% laminated LV thrombus. Cannot exclude non-compaction  - Volume status good on exam. NYHA  II. - Continue current medications including spiro 25, Entresto 49/51, digoxin 0.125 and carvedilol 6.25 bid - Unfortunately he is running out of meds. Will have him meet with PharmD and get straightened out.   - Increase spironolactone 25 mg daily. BMET today and repeat 7-10 days.  - Will need cMRI. Awaiting insurance  - Hold off on ICD for now as EF improving.  - Labs today  - Reinforced fluid restriction to < 2 L daily, sodium restriction to less than 2000 mg daily, and the importance of daily weights.  3. Large LV thrombus- noted on ECHO on 05/2016 on coumadin. -  Continue coumadin for now. ? NOAC   Arvilla Meres, MD  9:52 AM

## 2016-11-20 NOTE — Progress Notes (Signed)
  Echocardiogram 2D Echocardiogram has been performed.  Nolon Rod 11/20/2016, 8:52 AM

## 2016-11-20 NOTE — Addendum Note (Signed)
Encounter addended by: Noralee Space, RN on: 11/20/2016 10:42 AM<BR>    Actions taken: Sign clinical note, Medication long-term status modified, Order list changed

## 2016-11-20 NOTE — Addendum Note (Signed)
Encounter addended by: Noralee Space, RN on: 11/20/2016 10:29 AM<BR>    Actions taken: Diagnosis association updated, Sign clinical note, Order list changed

## 2016-12-02 ENCOUNTER — Telehealth (HOSPITAL_COMMUNITY): Payer: Self-pay | Admitting: *Deleted

## 2016-12-02 NOTE — Telephone Encounter (Signed)
[employer] patient assistance foundation form completed and faxed today along with signed prescription to [phone].

## 2016-12-04 ENCOUNTER — Telehealth (HOSPITAL_COMMUNITY): Payer: Self-pay | Admitting: Cardiology

## 2016-12-04 NOTE — Telephone Encounter (Signed)
REFILL VOICEMAIL Patient left voice mail requesting refills on entresto  Returned call to patient, patient is request a refill be returned to the pharmacy in [place]- patient enrolled in PAP application mailed with Rx on 5/7  Advised patient 800 number will contact patient to verify information and complete application process Samples provided until PAP complete

## 2016-12-17 ENCOUNTER — Encounter (HOSPITAL_COMMUNITY): Payer: Self-pay

## 2016-12-17 ENCOUNTER — Ambulatory Visit (HOSPITAL_COMMUNITY)
Admission: RE | Admit: 2016-12-17 | Discharge: 2016-12-17 | Disposition: A | Discharge status: Home / Self Care | Payer: Medicaid Other | Source: Ambulatory Visit | Attending: Internal Medicine | Admitting: Internal Medicine

## 2016-12-17 DIAGNOSIS — Z7901 Long term (current) use of anticoagulants: Secondary | ICD-10-CM | POA: Diagnosis not present

## 2016-12-17 DIAGNOSIS — I509 Heart failure, unspecified: Secondary | ICD-10-CM | POA: Insufficient documentation

## 2016-12-17 DIAGNOSIS — I110 Hypertensive heart disease with heart failure: Secondary | ICD-10-CM | POA: Insufficient documentation

## 2016-12-17 DIAGNOSIS — I429 Cardiomyopathy, unspecified: Secondary | ICD-10-CM | POA: Diagnosis present

## 2016-12-17 MED ORDER — CARVEDILOL 6.25 MG PO TABS: 6.2500 mg | ORAL_TABLET | Freq: Two times a day (BID) | ORAL | 5 refills | Status: DC

## 2016-12-17 MED ORDER — SACUBITRIL-VALSARTAN 97-103 MG PO TABS: 1.0000 | ORAL_TABLET | Freq: Two times a day (BID) | ORAL | 11 refills | Status: DC

## 2016-12-17 MED ORDER — APIXABAN 5 MG PO TABS: 5.0000 mg | ORAL_TABLET | Freq: Two times a day (BID) | ORAL | 3 refills | Status: DC

## 2016-12-17 NOTE — Patient Instructions (Addendum)
It was great to see you today!  Please INCREASE your Entresto to 97-103 mg TWICE daily.  Lab work scheduled on 01/01/17 at 9:00 am.  Please keep your appointment with the NP/PA on 01/20/17.

## 2016-12-17 NOTE — Progress Notes (Signed)
HF MD: Gala Romney  HPI:  Richard [name] a 41 y.o.AA malediagnosed with bilateral PEs and Cardiomyopathy in 05/2016.   In September he was evaluated and treated for pneumonia. Treated with antibiotics. He was able ot travel to [facility] but felt terrible. He continued to complain of increased dyspnea. He again returned for treatment at [facility]. He was treated for pneumonia again. He continued to struggle at home and went to [facility] ED. At that point he was diagnosed with bilateral PE and acute systolic heart failure. He was later discharged on coumadin and low dose carvedilol. He was intolerant ARB due to hypotension.   He presents today for pharmacist-led HF medication titration. At last HF clinic visit on 11/20/16, he was switched from Coumadin to Eliquis 5 mg BID. States he has missed one day of his medications in the past week but is usually very compliant with his regimen. His weight has trended up but he admits to eating "all the wrong foods" recently. Feels great and can walk on flat ground as far as he wants without getting short of breath. He has starting lifting weights again at the gym and will start a cardio regimen when he gets back from his trip to [facility] from 5/31-6/4.      Marland Kitchen Shortness of breath/dyspnea on exertion? no  . Orthopnea/PND? no . Edema? no . Lightheadedness/dizziness? no . Daily weights at home? Yes - starting to trend back down but ~280-285 lb  . Blood pressure/heart rate monitoring at home? no . Following low-sodium/fluid-restricted diet? No - states that he will go back to a "cardiac diet"   HF Medications: Carvedilol 6.25 mg PO BID Digoxin 0.125 mg PO daily Entresto 49-51 mg PO BID Spironolactone 25 mg PO daily   Has the patient been experiencing any side effects to the medications prescribed?  no  Does the patient have any problems obtaining medications due to transportation or finances?   No- now has [place] Medicaid  Understanding of regimen:  good Understanding of indications: good Potential of compliance: good Patient understands to avoid NSAIDs. Patient understands to avoid decongestants.    Pertinent Lab Values: . 11/20/16: Serum creatinine 0.77, BUN 10, Potassium 4.0, Sodium 140 . 09/05/16: BNP 869 . 08/22/16: Digoxin 0.4   Vital Signs: . Weight: 285 lb (last clinic visit wt: 271 lb) . Blood pressure: 152/100 mmHg  . Heart rate: 75 bpm       ReDS Vest - 12/17/16 1300      ReDS Vest   MR  No   Estimated volume prior to reading Low   Fitting Posture Standing   Height Marker Tall   Ruler Value 4   Center Strip Shifted   ReDS Value 36      Assessment: 1. 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 NYHA class Isymptoms.  - Volume status stable with no symptoms of volume overload although weight trending up, thought to be due to dietary indiscretion. Vest reading slightly elevated at 36 but has large chest which may skew this result.   - Increase Entresto to 97-103 mg BID  - Continue carvedilol 6.25 mg BID, digoxin 0.125 mg daily and spironolactone 25 mg daily  - We did discuss the benefits and eventual addition of Bidil but would like to optimize current regimen first with history of non-compliance and cost issues with medications - Basic disease state pathophysiology, medication indication, mechanism and side effects reviewed  at length with patient and he verbalized understanding 2. Bilateral PE (CTA 05/2016)  - Continue Eliquis 5 mg BID 3. Large LV thrombus (ECHO 05/2016)  - Continue Eliquis as above 4. HTN  - BP remains above goal <130/80 mmHg  - Increasing Entresto as above 5. PVCs/NSVT  - NSR recently   Plan: 1) Medication changes: Based on clinical presentation, vital signs and recent labs will increase Entresto to 97-103 mg BID 2) Labs: BMET in 2 weeks 3) Follow-up: [facility] on 01/20/17    Erika K. Bonnye Fava, PharmD, BCPS,  CPP Clinical Pharmacist Pager: [phone] Phone: [phone] 12/17/2016 1:24 PM  Agree with above.   Arvilla Meres, MD  10:55 PM

## 2016-12-18 ENCOUNTER — Ambulatory Visit (HOSPITAL_COMMUNITY): Payer: Medicaid Other

## 2017-01-01 ENCOUNTER — Ambulatory Visit (HOSPITAL_COMMUNITY)
Admission: RE | Admit: 2017-01-01 | Discharge: 2017-01-01 | Disposition: A | Discharge status: Home / Self Care | Payer: Medicaid Other | Source: Ambulatory Visit | Attending: Internal Medicine | Admitting: Internal Medicine

## 2017-01-01 ENCOUNTER — Telehealth (HOSPITAL_COMMUNITY): Payer: Self-pay | Admitting: Pharmacist

## 2017-01-01 ENCOUNTER — Other Ambulatory Visit (HOSPITAL_COMMUNITY): Payer: Self-pay | Admitting: *Deleted

## 2017-01-01 DIAGNOSIS — I5022 Chronic systolic (congestive) heart failure: Secondary | ICD-10-CM | POA: Insufficient documentation

## 2017-01-01 LAB — BASIC METABOLIC PANEL
Anion gap: 7 (ref 5–15)
BUN: 18 mg/dL (ref 6–20)
CALCIUM: 9.1 mg/dL (ref 8.9–10.3)
CO2: 26 mmol/L (ref 22–32)
CREATININE: 0.93 mg/dL (ref 0.61–1.24)
Chloride: 105 mmol/L (ref 101–111)
Glucose, Bld: 116 mg/dL — ABNORMAL HIGH (ref 65–99)
Potassium: 4.2 mmol/L (ref 3.5–5.1)
Sodium: 138 mmol/L (ref 135–145)

## 2017-01-01 NOTE — Telephone Encounter (Signed)
Entresto PA approved by [place] Medicaid through 12/12/17.   Tyler Deis. Bonnye Fava, PharmD, BCPS, CPP Clinical Pharmacist Pager: [phone] Phone: [phone] 01/01/2017 8:58 AM

## 2017-01-20 ENCOUNTER — Ambulatory Visit (HOSPITAL_COMMUNITY)
Admission: RE | Admit: 2017-01-20 | Discharge: 2017-01-20 | Disposition: A | Discharge status: Home / Self Care | Payer: Medicaid Other | Source: Ambulatory Visit | Attending: Internal Medicine | Admitting: Internal Medicine

## 2017-01-20 VITALS — BP 132/79 | HR 78 | Wt 290.2 lb

## 2017-01-20 DIAGNOSIS — I2699 Other pulmonary embolism without acute cor pulmonale: Secondary | ICD-10-CM | POA: Diagnosis not present

## 2017-01-20 DIAGNOSIS — I513 Intracardiac thrombosis, not elsewhere classified: Secondary | ICD-10-CM | POA: Diagnosis not present

## 2017-01-20 DIAGNOSIS — I240 Acute coronary thrombosis not resulting in myocardial infarction: Secondary | ICD-10-CM

## 2017-01-20 DIAGNOSIS — I5023 Acute on chronic systolic (congestive) heart failure: Secondary | ICD-10-CM | POA: Diagnosis not present

## 2017-01-20 DIAGNOSIS — I428 Other cardiomyopathies: Secondary | ICD-10-CM | POA: Insufficient documentation

## 2017-01-20 DIAGNOSIS — I5022 Chronic systolic (congestive) heart failure: Secondary | ICD-10-CM | POA: Diagnosis not present

## 2017-01-20 DIAGNOSIS — Z7901 Long term (current) use of anticoagulants: Secondary | ICD-10-CM | POA: Diagnosis not present

## 2017-01-20 NOTE — Progress Notes (Signed)
Advanced Heart Failure Medication Review by a Pharmacist  Does the patient  feel that his/her medications are working for him/her?  yes  Has the patient been experiencing any side effects to the medications prescribed?  no  Does the patient measure his/her own blood pressure or blood glucose at home?  no   Does the patient have any problems obtaining medications due to transportation or finances?   no  Understanding of regimen: good Understanding of indications: good Potential of compliance: good Patient understands to avoid NSAIDs. Patient understands to avoid decongestants.  Issues to address at subsequent visits: none.   Pharmacist comments: Mr. Lare is a pleasant 41 y/o male who presents with his medication bottles. He endorses good adherence to his medication regimen. He asked if he could start eating more dark leafy greens as he is no longer on coumadin and now taking Eliquis. He had no other medication related questions or concerns for me at this time.   Marlinda Mike PharmD Candidate  Time with patient: 10 minutes Preparation and documentation time: 10 minutes Total time: 20 minutes

## 2017-01-20 NOTE — Patient Instructions (Signed)
Will schedule you for Cardiac MRI at [facility]. Radiology department will call you to schedule.  No changes to medication at this time.  No lab work today.  Follow up 3 months with Dr. Gala Romney. Take all medication as prescribed the day of your appointment. Bring all medications with you to your appointment.  Do the following things EVERYDAY: 1) Weigh yourself in the morning before breakfast. Write it down and keep it in a log. 2) Take your medicines as prescribed 3) Eat low salt foods-Limit salt (sodium) to 2000 mg per day.  4) Stay as active as you can everyday 5) Limit all fluids for the day to less than 2 liters

## 2017-01-20 NOTE — Progress Notes (Signed)
Advanced Heart Failure Clinic Note   PCP: None Primary HF Cardiologist: Dr Gala Romney  HPI: Richard [name] is a 41 y.o. male diagnosed with bilateral PEs and Cardiomyopathy in 05/2016.   In September he was evaluated and treated for pneumonia. Treated with antibiotics. He was able ot travel to [facility] but felt terrible. He continued to complain of increased dyspnea. He again returned for treatment at [facility]. He was treated for pneumonia again. He continued to struggle at home and went to [facility] ED. At that point he was diagnosed with bilateral PE and acute systolic heart failure. He was later discharged on coumadin and low dose carvedilol. He was intolerant ARB due to hypotension.   He presents today for follow up.  Seen by Pharm-D in May and Entresto increased to goal dose. Weight up 5 lbs from that visit. Weight is up 19 lbs total from April. Denies SOB or CP. Taking all medications. Denies orthopnea or bendopnea. No lightheadedness or dizziness. Walking for an 1 hr on M/W/F. Working out 5 days a week. No peripheral edema.  Weight at home up to ~280-285. Feels like he has a combination of bulking up with exercising and eating a lot of sweets. Denies any bleeding on eliquis.   ECHO 11/20/16 EF 25-30% Brobable small laminated LV thrombus   ECHO 05/2016: EF 10-15%. RV dilated LV thrombus    FH: Negative for heart disease SH: No alcohol or drug use.    Review of systems complete and found to be negative unless listed in HPI.    SH:  Social History   Social History  . Marital status: Single    Spouse name: N/A  . Number of children: N/A  . Years of education: N/A   Occupational History  . Not on file.   Social History Main Topics  . Smoking status: Never Smoker  . Smokeless tobacco: Never Used  . Alcohol use Yes     Comment: rare  . Drug use: No  . Sexual activity: Not on file   Other Topics Concern  . Not on file   Social History Narrative  . No narrative on  file    FH:  Family History  Problem Relation Age of Onset  . Other Mother        Neg family history of clotting disorder or heart disease  . Clotting disorder Neg Hx   . Rheumatologic disease Neg Hx     No past medical history on file.  Current Outpatient Prescriptions  Medication Sig Dispense Refill  . apixaban (ELIQUIS) 5 MG TABS tablet Take 1 tablet (5 mg total) by mouth 2 (two) times daily. 180 tablet 3  . carvedilol (COREG) 6.25 MG tablet Take 1 tablet (6.25 mg total) by mouth 2 (two) times daily with a meal. 60 tablet 5  . digoxin (LANOXIN) 0.125 MG tablet Take 1 tablet (125 mcg total) by mouth daily. 30 tablet 3  . sacubitril-valsartan (ENTRESTO) 97-103 MG Take 1 tablet by mouth 2 (two) times daily. 60 tablet 11  . spironolactone (ALDACTONE) 25 MG tablet Take 1 tablet (25 mg total) by mouth daily. 30 tablet 6   No current facility-administered medications for this encounter.    Vitals:   01/20/17 0845  BP: 132/79  Pulse: 78  SpO2: 100%  Weight: 290 lb 3.2 oz (131.6 kg)   Wt Readings from Last 3 Encounters:  01/20/17 290 lb 3.2 oz (131.6 kg)  12/17/16 285 lb (129.3 kg)  11/20/16 271  lb 4 oz (123 kg)    PHYSICAL EXAM: General: Well appearing. No resp difficulty. HEENT: Normal Neck: Supple. JVP 5-6. Carotids 2+ bilat; no bruits. No thyromegaly or nodule noted. Cor: PMI nondisplaced. RRR, No M/G/R noted Lungs: CTAB, normal effort. Abdomen: Soft, non-tender, non-distended, no HSM. No bruits or masses. +BS  Extremities: No cyanosis, clubbing, rash, R and LLE no edema.  Neuro: Alert & orientedx3, cranial nerves grossly intact. moves all 4 extremities w/o difficulty. Affect pleasant   ASSESSMENT & PLAN: 1. Bilateral PE - CTA 05/2016  - Now on Eliquis. No bleeding.  2. Chronic Systolic Heart Failure- ECHO 06/17/16 EF 10-15%. With LV thrombus. 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 Echo 10/2016 with EF improving but still down 25-30% laminated LV thrombus. Cannot  exclude non-compaction  - Volume status stable on exam despite weight gain.  NYHA II symptoms. - Continue spiro 25 mg daily - Continue Entresto 97/103 mg BID - Continue digoxin 0.125 mg - Continue coreg 3.25 mg BID - Needs cMRI. Will order. Holding off on ICD for now with improving EF.  - Reinforced fluid restriction to < 2 L daily, sodium restriction to less than 2000 mg daily, and the importance of daily weights.   3. Large LV thrombus- noted on ECHO on 05/2016 on coumadin. -  Now stable on Eliquis. No bleeding. No change.    Graciella Freer, PA-C  8:57 AM

## 2017-01-22 ENCOUNTER — Telehealth: Payer: Self-pay | Admitting: Student

## 2017-01-22 ENCOUNTER — Telehealth (HOSPITAL_COMMUNITY): Payer: Self-pay | Admitting: *Deleted

## 2017-01-22 NOTE — Telephone Encounter (Signed)
No pre cert reqd message sent to [facility] to schedule.

## 2017-01-22 NOTE — Telephone Encounter (Signed)
Called patient to find out when he wants to schedule his MRI.  His voicemail on his phone is not set up yet so could not leave a message.

## 2017-01-28 ENCOUNTER — Encounter: Payer: Self-pay | Admitting: Student

## 2017-02-10 ENCOUNTER — Ambulatory Visit (HOSPITAL_COMMUNITY): Admission: RE | Admit: 2017-02-10 | Payer: Medicaid Other | Source: Ambulatory Visit

## 2017-02-25 ENCOUNTER — Telehealth (HOSPITAL_COMMUNITY): Payer: Self-pay | Admitting: Pharmacist

## 2017-02-25 NOTE — Telephone Encounter (Signed)
Eliquis 5 mg BID patient assistance approved by [employer] through 02/21/18 but as long as Mr. Richard [name] has [place] Medicaid, he is not eligible for this patient assistance program.   Tyler Deis. Bonnye Fava, PharmD, BCPS, CPP Clinical Pharmacist Pager: [phone] Phone: [phone] 02/25/2017 2:57 PM

## 2017-04-21 ENCOUNTER — Encounter (HOSPITAL_COMMUNITY): Payer: Medicaid Other | Admitting: Internal Medicine

## 2017-05-13 ENCOUNTER — Inpatient Hospital Stay (HOSPITAL_COMMUNITY): Admission: RE | Admit: 2017-05-13 | Payer: Medicaid Other | Source: Ambulatory Visit | Admitting: Internal Medicine

## 2017-07-03 ENCOUNTER — Ambulatory Visit (HOSPITAL_COMMUNITY)
Admission: RE | Admit: 2017-07-03 | Discharge: 2017-07-03 | Disposition: A | Discharge status: Home / Self Care | Payer: Medicaid Other | Source: Ambulatory Visit | Attending: Internal Medicine | Admitting: Internal Medicine

## 2017-07-03 ENCOUNTER — Encounter (HOSPITAL_COMMUNITY): Payer: Self-pay | Admitting: Internal Medicine

## 2017-07-03 VITALS — BP 168/104 | HR 90 | Wt 350.0 lb

## 2017-07-03 DIAGNOSIS — I959 Hypotension, unspecified: Secondary | ICD-10-CM | POA: Insufficient documentation

## 2017-07-03 DIAGNOSIS — I2699 Other pulmonary embolism without acute cor pulmonale: Secondary | ICD-10-CM | POA: Diagnosis not present

## 2017-07-03 DIAGNOSIS — Z79899 Other long term (current) drug therapy: Secondary | ICD-10-CM | POA: Insufficient documentation

## 2017-07-03 DIAGNOSIS — I429 Cardiomyopathy, unspecified: Secondary | ICD-10-CM | POA: Insufficient documentation

## 2017-07-03 DIAGNOSIS — Z7901 Long term (current) use of anticoagulants: Secondary | ICD-10-CM | POA: Insufficient documentation

## 2017-07-03 DIAGNOSIS — Z9114 Patient's other noncompliance with medication regimen: Secondary | ICD-10-CM | POA: Insufficient documentation

## 2017-07-03 DIAGNOSIS — I5022 Chronic systolic (congestive) heart failure: Secondary | ICD-10-CM | POA: Diagnosis not present

## 2017-07-03 LAB — BASIC METABOLIC PANEL
Anion gap: 9 (ref 5–15)
BUN: 12 mg/dL (ref 6–20)
CHLORIDE: 104 mmol/L (ref 101–111)
CO2: 24 mmol/L (ref 22–32)
CREATININE: 0.98 mg/dL (ref 0.61–1.24)
Calcium: 9.1 mg/dL (ref 8.9–10.3)
GFR calc Af Amer: >60 mL/min (ref >60)
GFR calc non Af Amer: >60 mL/min (ref >60)
Glucose, Bld: 157 mg/dL — ABNORMAL HIGH (ref 65–99)
POTASSIUM: 3.8 mmol/L (ref 3.5–5.1)
SODIUM: 137 mmol/L (ref 135–145)

## 2017-07-03 NOTE — Progress Notes (Signed)
Advanced Heart Failure Clinic Note   PCP: None Primary HF Cardiologist: Dr Gala RomneyBensimhon  HPI: Richard [name] is a 41 y.o. male diagnosed with bilateral PEs and systolic HF in 05/2016.   In September he was evaluated and treated for pneumonia. Treated with antibiotics. [place] but felt terrible. He continued to complain of increased dyspnea. He again returned for treatment at [facility]. He was treated for pneumonia again. He continued to struggle at home and [facility]. At that point he was diagnosed with bilateral PE and acute systolic heart failure. He was later discharged on coumadin and low dose carvedilol. He was intolerant ARB due to hypotension. He did not have a cardiac cath,.He is too large for cMRI  Today he returns for HF follow up. Overall feeling fine. Denies SOB/PND/Orthopnea. Not SOB walking up steps. Appetite ok. No fever or chills. He has not been taking medications or weighing for weeks. Went to the gym for the first time last week and weight up to 350 pounds. He did not take his medications today.   ECHO 11/20/16 EF 25-30% Brobable small laminated LV thrombus   ECHO 05/2016: EF 10-15%. RV dilated LV thrombus    FH: Negative for heart disease SH: No alcohol or drug use.    Review of systems complete and found to be negative unless listed in HPI.    SH:  Social History   Socioeconomic History  . Marital status: Single    Spouse name: Not on file  . Number of children: Not on file  . Years of education: Not on file  . Highest education level: Not on file  Social Needs  . Financial resource strain: Not on file  . Food insecurity - worry: Not on file  . Food insecurity - inability: Not on file  . Transportation needs - medical: Not on file  . Transportation needs - non-medical: Not on file  Occupational History  . Not on file  Tobacco Use  . Smoking status: Never Smoker  . Smokeless tobacco: Never Used  Substance and Sexual  Activity  . Alcohol use: Yes    Comment: rare  . Drug use: No  . Sexual activity: Not on file  Other Topics Concern  . Not on file  Social History Narrative  . Not on file    FH:  Family History  Problem Relation Age of Onset  . Other Mother        Neg family history of clotting disorder or heart disease  . Clotting disorder Neg Hx   . Rheumatologic disease Neg Hx     No past medical history on file.  Current Outpatient Medications  Medication Sig Dispense Refill  . apixaban (ELIQUIS) 5 MG TABS tablet Take 1 tablet (5 mg total) by mouth 2 (two) times daily. 180 tablet 3  . carvedilol (COREG) 6.25 MG tablet Take 1 tablet (6.25 mg total) by mouth 2 (two) times daily with a meal. 60 tablet 5  . digoxin (LANOXIN) 0.125 MG tablet Take 1 tablet (125 mcg total) by mouth daily. 30 tablet 3  . sacubitril-valsartan (ENTRESTO) 97-103 MG Take 1 tablet by mouth 2 (two) times daily. 60 tablet 11  . spironolactone (ALDACTONE) 25 MG tablet Take 1 tablet (25 mg total) by mouth daily. 30 tablet 6   No current facility-administered medications for this encounter.    Vitals:   07/03/17 1050  BP: (!) 168/104  Pulse: 90  SpO2:  98%  Weight: (!) 350 lb (158.8 kg)   Wt Readings from Last 3 Encounters:  07/03/17 (!) 350 lb (158.8 kg)  01/20/17 290 lb 3.2 oz (131.6 kg)  12/17/16 285 lb (129.3 kg)    PHYSICAL EXAM: General:  Well appearing. No resp difficulty HEENT: normal Neck: supple. No obvious  JVD. Carotids 2+ bilat; no bruits. No lymphadenopathy or thryomegaly appreciated. Cor: PMI non palpable . Regular rate & rhythm. No rubs, gallops or murmurs. Lungs: clear Abdomen: obese, soft, nontender, nondistended. No hepatosplenomegaly. No bruits or masses. Good bowel sounds. Extremities: no cyanosis, clubbing, rash, edema Neuro: alert & orientedx3, cranial nerves grossly intact. moves all 4 extremities w/o difficulty. Affect pleasant  ASSESSMENT & PLAN: 1. Bilateral PE - CTA 05/2016  -  Restart Eliquis. No bleeding.  - Given that he is more than 6 months out can consider dropping to 2.5 bid but would not do that yet with LV thrombus and his size  2. Chronic Systolic Heart Failure- ECHO 06/17/16 EF 10-15%. With LV thrombus. 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 Echo 10/2016 with EF i25-30% laminated LV thrombus. Cannot exclude non-compaction.  NYHA I. Volume status stable.  Dr Gala Romney performed portable ECHO. EF 20-25%.  Restart carvedilol 6.25 mg twice a day, entresto 97-103 twice a day, and sprio 25 mg daily    -   3. Large LV thrombus- noted on ECHO on 05/2016 Re  -  Now stable on Eliquis. No bleeding. No change.    Tonye Becket, NP  11:14 AM   Patient seen and examined with Tonye Becket, NP. We discussed all aspects of the encounter. I agree with the assessment and plan as stated above.   He remains NYHA Class I despite noncompliance with HF meds and weight gain. ECHO done personally in clinic and EF 20-25%. I did no see an LV thrombus but windows a bit limited. He ha not had formal ischemic w/u that I can find record of. Will need to consider. Echo raised the question of non-compaction but we have not been able to confirm with MRI due to his size. We will restart meds. Stressed need for complaince. Continue AC for now.   Total time spent 35 minutes. Over half that time spent discussing above.   Arvilla Meres, MD  10:13 PM

## 2017-07-03 NOTE — Patient Instructions (Signed)
Labs today We will only contact you if something comes back abnormal or we need to make some changes. Otherwise no news is good news!  Your physician recommends that you schedule a follow-up appointment in: 2 WEEKS AND AGAIN 4 WEEKS WITH AMY CLEGG,NP   Do the following things EVERYDAY: 1) Weigh yourself in the morning before breakfast. Write it down and keep it in a log. 2) Take your medicines as prescribed 3) Eat low salt foods-Limit salt (sodium) to 2000 mg per day.  4) Stay as active as you can everyday 5) Limit all fluids for the day to less than 2 liters

## 2017-07-17 ENCOUNTER — Encounter (HOSPITAL_COMMUNITY): Payer: Self-pay

## 2017-07-17 ENCOUNTER — Ambulatory Visit (HOSPITAL_COMMUNITY)
Admission: RE | Admit: 2017-07-17 | Discharge: 2017-07-17 | Disposition: A | Discharge status: Home / Self Care | Payer: Medicaid Other | Source: Ambulatory Visit | Attending: Cardiology | Admitting: Cardiology

## 2017-07-17 VITALS — BP 116/74 | HR 85 | Wt 341.0 lb

## 2017-07-17 DIAGNOSIS — I959 Hypotension, unspecified: Secondary | ICD-10-CM | POA: Insufficient documentation

## 2017-07-17 DIAGNOSIS — I513 Intracardiac thrombosis, not elsewhere classified: Secondary | ICD-10-CM | POA: Diagnosis not present

## 2017-07-17 DIAGNOSIS — I240 Acute coronary thrombosis not resulting in myocardial infarction: Secondary | ICD-10-CM

## 2017-07-17 DIAGNOSIS — I429 Cardiomyopathy, unspecified: Secondary | ICD-10-CM | POA: Insufficient documentation

## 2017-07-17 DIAGNOSIS — I5022 Chronic systolic (congestive) heart failure: Secondary | ICD-10-CM | POA: Insufficient documentation

## 2017-07-17 DIAGNOSIS — Z79899 Other long term (current) drug therapy: Secondary | ICD-10-CM | POA: Diagnosis not present

## 2017-07-17 DIAGNOSIS — I2699 Other pulmonary embolism without acute cor pulmonale: Secondary | ICD-10-CM | POA: Diagnosis not present

## 2017-07-17 DIAGNOSIS — Z7901 Long term (current) use of anticoagulants: Secondary | ICD-10-CM | POA: Insufficient documentation

## 2017-07-17 DIAGNOSIS — Z86711 Personal history of pulmonary embolism: Secondary | ICD-10-CM | POA: Diagnosis present

## 2017-07-17 MED ORDER — CARVEDILOL 6.25 MG PO TABS: 6.2500 mg | ORAL_TABLET | Freq: Two times a day (BID) | ORAL | 5 refills | Status: DC

## 2017-07-17 MED ORDER — SPIRONOLACTONE 25 MG PO TABS: 25.0000 mg | ORAL_TABLET | Freq: Every day | ORAL | 3 refills | Status: DC

## 2017-07-17 MED ORDER — SACUBITRIL-VALSARTAN 97-103 MG PO TABS: 1.0000 | ORAL_TABLET | Freq: Two times a day (BID) | ORAL | 3 refills | Status: DC

## 2017-07-17 MED ORDER — DIGOXIN 125 MCG PO TABS: 125.0000 ug | ORAL_TABLET | Freq: Every day | ORAL | 3 refills | Status: DC

## 2017-07-17 NOTE — Progress Notes (Signed)
Advanced Heart Failure Clinic Note   PCP: None Primary HF Cardiologist: Dr Gala RomneyBensimhon  HPI: Richard [name] is a 41 y.o. male diagnosed with bilateral PEs and systolic HF in 05/2016.   In September he was evaluated and treated for pneumonia. Treated with antibiotics. [place] [facility] [place] but felt terrible. He continued to complain of increased dyspnea. He again returned for [facility]. He was treated for pneumonia again. He continued to struggle at home and [facility] At that point he was diagnosed with bilateral PE and acute systolic heart failure. He was later discharged on coumadin and low dose carvedilol. He was intolerant ARB due to hypotension. He did not have a cardiac cath,.He is too large for cMRI  Today he returns for HF follow up. Last visit he was started back on 6.25 mg carvedilol twice daily.  Overall feeling fine. Denies SOB/PND/Orthopnea. Appetite ok. No fever or chills. Weight at home starting to trend down. No bleeding problems. He has stopped drinking alcohol. Taking all medications.   ECHO 11/20/16 EF 25-30% Brobable small laminated LV thrombus   ECHO 05/2016: EF 10-15%. RV dilated LV thrombus    FH: Negative for heart disease SH: No alcohol or drug use.    Review of systems complete and found to be negative unless listed in HPI.    SH:  Social History   Socioeconomic History  . Marital status: Single    Spouse name: Not on file  . Number of children: Not on file  . Years of education: Not on file  . Highest education level: Not on file  Social Needs  . Financial resource strain: Not on file  . Food insecurity - worry: Not on file  . Food insecurity - inability: Not on file  . Transportation needs - medical: Not on file  . Transportation needs - non-medical: Not on file  Occupational History  . Not on file  Tobacco Use  . Smoking status: Never Smoker  . Smokeless tobacco: Never Used  Substance and Sexual Activity    . Alcohol use: Yes    Comment: rare  . Drug use: No  . Sexual activity: Not on file  Other Topics Concern  . Not on file  Social History Narrative  . Not on file    FH:  Family History  Problem Relation Age of Onset  . Other Mother        Neg family history of clotting disorder or heart disease  . Clotting disorder Neg Hx   . Rheumatologic disease Neg Hx     No past medical history on file.  Current Outpatient Medications  Medication Sig Dispense Refill  . apixaban (ELIQUIS) 5 MG TABS tablet Take 1 tablet (5 mg total) by mouth 2 (two) times daily. 180 tablet 3  . carvedilol (COREG) 6.25 MG tablet Take 1 tablet (6.25 mg total) by mouth 2 (two) times daily with a meal. 60 tablet 5  . digoxin (LANOXIN) 0.125 MG tablet Take 1 tablet (125 mcg total) by mouth daily. 30 tablet 3  . sacubitril-valsartan (ENTRESTO) 97-103 MG Take 1 tablet by mouth 2 (two) times daily. 60 tablet 11  . spironolactone (ALDACTONE) 25 MG tablet Take 1 tablet (25 mg total) by mouth daily. 30 tablet 6   No current facility-administered medications for this encounter.    Vitals:   07/17/17 0857  BP: 116/74  Pulse: 85  SpO2: 98%  Weight: (!) 341 lb (154.7  kg)   Wt Readings from Last 3 Encounters:  07/17/17 (!) 341 lb (154.7 kg)  07/03/17 (!) 350 lb (158.8 kg)  01/20/17 290 lb 3.2 oz (131.6 kg)    PHYSICAL EXAM: General:  Well appearing. No resp difficulty HEENT: normal Neck: supple. no JVD. Carotids 2+ bilat; no bruits. No lymphadenopathy or thryomegaly appreciated. Cor: PMI nondisplaced. Regular rate & rhythm. No rubs, gallops or murmurs. Lungs: clear Abdomen: soft, nontender, nondistended. No hepatosplenomegaly. No bruits or masses. Good bowel sounds. Extremities: no cyanosis, clubbing, rash, edema Neuro: alert & orientedx3, cranial nerves grossly intact. moves all 4 extremities w/o difficulty. Affect pleasant  ASSESSMENT & PLAN: 1. Bilateral PE - CTA 05/2016  - No bleeding problems.  -  Continue eliquis 5 mg twice a day.   - Given that he is more than 6 months out can consider dropping to 2.5 bid but would not do that yet with LV thrombus and his size  2. Chronic Systolic Heart Failure- ECHO 06/17/16 EF 10-15%. With LV thrombus. 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 Echo 10/2016 with EF i25-30% laminated LV thrombus. Cannot exclude non-compaction.  NYHA I. Volume status stable.  I wanted to increase carvedilol today however he declines and wants to wait until new appointment.  On goal dose entresto 97-103 twice a day, and sprio 25 mg daily     3. Large LV thrombus- noted on ECHO on 05/2016 -  Now stable on Eliquis. No bleeding. No change.   Follow up in 4 weeks. Set up ECHO at next visit. Increase carvedilol next visit.    Tonye Becket, NP  9:02 AM

## 2017-07-17 NOTE — Patient Instructions (Addendum)
Refills sent to pharmacy.  Follow up 2 weeks as scheduled.  Take all medication as prescribed the day of your appointment. Bring all medications with you to your appointment.  Do the following things EVERYDAY: 1) Weigh yourself in the morning before breakfast. Write it down and keep it in a log. 2) Take your medicines as prescribed 3) Eat low salt foods-Limit salt (sodium) to 2000 mg per day.  4) Stay as active as you can everyday 5) Limit all fluids for the day to less than 2 liters

## 2017-08-01 ENCOUNTER — Encounter (HOSPITAL_COMMUNITY): Payer: Medicaid Other | Admitting: Internal Medicine

## 2017-08-07 ENCOUNTER — Encounter (HOSPITAL_COMMUNITY): Payer: Self-pay

## 2017-08-07 ENCOUNTER — Ambulatory Visit (HOSPITAL_COMMUNITY)
Admission: RE | Admit: 2017-08-07 | Discharge: 2017-08-07 | Disposition: A | Discharge status: Home / Self Care | Payer: Medicaid Other | Source: Ambulatory Visit | Attending: Internal Medicine | Admitting: Internal Medicine

## 2017-08-07 VITALS — BP 146/100 | HR 84 | Wt 347.8 lb

## 2017-08-07 DIAGNOSIS — I5022 Chronic systolic (congestive) heart failure: Secondary | ICD-10-CM | POA: Insufficient documentation

## 2017-08-07 DIAGNOSIS — I240 Acute coronary thrombosis not resulting in myocardial infarction: Secondary | ICD-10-CM

## 2017-08-07 DIAGNOSIS — Z79899 Other long term (current) drug therapy: Secondary | ICD-10-CM | POA: Diagnosis not present

## 2017-08-07 DIAGNOSIS — I513 Intracardiac thrombosis, not elsewhere classified: Secondary | ICD-10-CM

## 2017-08-07 DIAGNOSIS — I2699 Other pulmonary embolism without acute cor pulmonale: Secondary | ICD-10-CM

## 2017-08-07 LAB — CBC
HEMATOCRIT: 42.9 % (ref 39.0–52.0)
HEMOGLOBIN: 14.4 g/dL (ref 13.0–17.0)
MCH: 29.3 pg (ref 26.0–34.0)
MCHC: 33.6 g/dL (ref 30.0–36.0)
MCV: 87.2 fL (ref 78.0–100.0)
PLATELETS: 210 10*3/uL (ref 150–400)
RBC: 4.92 MIL/uL (ref 4.22–5.81)
RDW: 13.1 % (ref 11.5–15.5)
WBC: 3.2 10*3/uL — AB (ref 4.0–10.5)

## 2017-08-07 LAB — BASIC METABOLIC PANEL
ANION GAP: 8 (ref 5–15)
BUN: 12 mg/dL (ref 6–20)
CHLORIDE: 106 mmol/L (ref 101–111)
CO2: 25 mmol/L (ref 22–32)
Calcium: 8.9 mg/dL (ref 8.9–10.3)
Creatinine, Ser: 1.02 mg/dL (ref 0.61–1.24)
GFR calc non Af Amer: >60 mL/min (ref >60)
Glucose, Bld: 150 mg/dL — ABNORMAL HIGH (ref 65–99)
POTASSIUM: 4 mmol/L (ref 3.5–5.1)
SODIUM: 139 mmol/L (ref 135–145)

## 2017-08-07 NOTE — Progress Notes (Signed)
Advanced Heart Failure Clinic Note   PCP: None Primary HF Cardiologist: Dr Gala Romney  HPI: Richard [name] is a 42 y.o. male diagnosed with bilateral PEs and systolic HF in 05/2016.   In 03/2017 he was evaluated and treated for pneumonia. Treated with antibiotics. He was able ot travel to [facility] but felt terrible. He continued to complain of increased dyspnea. He again returned for treatment at [facility]. He was treated for pneumonia again. He continued to struggle at home and went to [facility] ED. At that point he was diagnosed with bilateral PE and acute systolic heart failure. He was later discharged on coumadin and low dose carvedilol. He was intolerant ARB due to hypotension. He did not have a cardiac cath,.He is too large for cMRI.  He presents today for regular follow up. Last visit refused coreg titration, but was otherwise doing well. Today he states he is feeling well overall.  He states he wishes to "wean" himself off of any medications, and refuses any up-titration.  He understands that is heart is weak but feels like he can make it better. Weight up, but had plate of wings last night. Denies SOB. Working out in [employer] several times a week, mostly weights, no real set aside cardio. Drinking > 2 L (Up to a gallon) most days.  He states he is taking all of his current medications as directed.   ECHO 11/20/16 EF 25-30% Probable small laminated LV thrombus   ECHO 05/2016: EF 10-15%. RV dilated LV thrombus    FH: Negative for heart disease SH: No alcohol or drug use.   Review of systems complete and found to be negative unless listed in HPI.    SH:  Social History   Socioeconomic History  . Marital status: Single    Spouse name: Not on file  . Number of children: Not on file  . Years of education: Not on file  . Highest education level: Not on file  Social Needs  . Financial resource strain: Not on file  . Food insecurity - worry: Not on file  . Food insecurity -  inability: Not on file  . Transportation needs - medical: Not on file  . Transportation needs - non-medical: Not on file  Occupational History  . Not on file  Tobacco Use  . Smoking status: Never Smoker  . Smokeless tobacco: Never Used  Substance and Sexual Activity  . Alcohol use: Yes    Comment: rare  . Drug use: No  . Sexual activity: Not on file  Other Topics Concern  . Not on file  Social History Narrative  . Not on file    FH:  Family History  Problem Relation Age of Onset  . Other Mother        Neg family history of clotting disorder or heart disease  . Clotting disorder Neg Hx   . Rheumatologic disease Neg Hx     No past medical history on file.  Current Outpatient Medications  Medication Sig Dispense Refill  . apixaban (ELIQUIS) 5 MG TABS tablet Take 1 tablet (5 mg total) by mouth 2 (two) times daily. 180 tablet 3  . carvedilol (COREG) 6.25 MG tablet Take 1 tablet (6.25 mg total) by mouth 2 (two) times daily with a meal. 60 tablet 5  . digoxin (LANOXIN) 0.125 MG tablet Take 1 tablet (125 mcg total) by mouth daily. 90 tablet 3  . sacubitril-valsartan (ENTRESTO) 97-103 MG Take 1 tablet by mouth 2 (two)  times daily. 180 tablet 3  . spironolactone (ALDACTONE) 25 MG tablet Take 1 tablet (25 mg total) by mouth daily. 90 tablet 3   No current facility-administered medications for this encounter.    Vitals:   08/07/17 0918  BP: (!) 146/100  Pulse: 84  SpO2: 99%  Weight: (!) 347 lb 12.8 oz (157.8 kg)     Wt Readings from Last 3 Encounters:  08/07/17 (!) 347 lb 12.8 oz (157.8 kg)  07/17/17 (!) 341 lb (154.7 kg)  07/03/17 (!) 350 lb (158.8 kg)    PHYSICAL EXAM: General: Well appearing. No resp difficulty. HEENT: Normal Neck: Supple. JVP 5-6. Carotids 2+ bilat; no bruits. No thyromegaly or nodule noted. Cor: PMI nondisplaced. RRR, No M/G/R noted Lungs: CTAB, normal effort. Abdomen: Soft, non-tender, non-distended, no HSM. No bruits or masses. +BS   Extremities: No cyanosis, clubbing, or rash. R and LLE no edema.  Neuro: Alert & orientedx3, cranial nerves grossly intact. moves all 4 extremities w/o difficulty. Affect pleasant   ASSESSMENT & PLAN: 1. Bilateral PE - CTA 05/2016  - No bleeding problems.  - Continue eliquis 5 mg BID. - Given that he is more than 6 months out can consider dropping to 2.5 bid but would not do that yet with LV thrombus and his size  2. Chronic Systolic Heart Failure- ECHO 06/17/16 EF 10-15%. With LV thrombus. 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 Echo 10/2016 with EF 25-30% laminated LV thrombus. Cannot exclude non-compaction.  - NYHA I symptoms by patient reports, but suspect he may be overestimating his abilities - Volume status stable on exam despite weight gain.  - Continue entresto 97/103 mg BID. BMET - Continue spiro 25 mg daily - Continue Coreg 6.25.  - Continue digoxin 0.125 mg daily - Pt adamantly refuses any up-titration of meds.  Refuses Echo this month, would like to hold off and refuses to give reason. Agrees to echo with next appointment.  - Reinforced fluid restriction to < 2 L daily, sodium restriction to less than 2000 mg daily, and the importance of daily weights.  3. Large LV thrombus- noted on ECHO on 05/2016 - Stable on Eliquis 5 mg BID. Denies bleeding.   Pt has very poor insight into his disease and outright refuses medication titration. Discussed with patient his HF will very likely continue to worsen in absence of GDMT. Discussed risks including worsening HF and death. Discussed ICD consideration, again which he is adamantly against.   Will have MD see at next visit for further discussion.  Pt prognosis concerning with lack of insight and refusal to follow advice.  Medication compliance is unclear, but he states he is taking all of his current medications.   Graciella Freer, PA-C  9:24 AM

## 2017-08-07 NOTE — Patient Instructions (Signed)
Routine lab work today. Will notify you of abnormal results, otherwise no news is good news!  Follow up 4-6 weeks with Dr. Gala Romney and echocardiogram.  Take all medication as prescribed the day of your appointment. Bring all medications with you to your appointment.  Do the following things EVERYDAY: 1) Weigh yourself in the morning before breakfast. Write it down and keep it in a log. 2) Take your medicines as prescribed 3) Eat low salt foods-Limit salt (sodium) to 2000 mg per day.  4) Stay as active as you can everyday 5) Limit all fluids for the day to less than 2 liters

## 2017-09-17 ENCOUNTER — Encounter (HOSPITAL_COMMUNITY): Payer: Medicaid Other | Admitting: Internal Medicine

## 2017-09-17 ENCOUNTER — Ambulatory Visit (HOSPITAL_COMMUNITY)
Admission: RE | Admit: 2017-09-17 | Payer: Medicaid Other | Source: Ambulatory Visit | Attending: Adult Health | Admitting: Adult Health

## 2017-12-11 DIAGNOSIS — T07XXXA Unspecified multiple injuries, initial encounter: Secondary | ICD-10-CM | POA: Diagnosis not present

## 2017-12-11 DIAGNOSIS — W57XXXA Bitten or stung by nonvenomous insect and other nonvenomous arthropods, initial encounter: Secondary | ICD-10-CM | POA: Diagnosis not present

## 2018-01-27 ENCOUNTER — Other Ambulatory Visit (HOSPITAL_COMMUNITY): Payer: Self-pay | Admitting: Cardiology

## 2018-01-27 MED ORDER — APIXABAN 5 MG PO TABS
5.0000 mg | ORAL_TABLET | Freq: Two times a day (BID) | ORAL | 3 refills | Status: DC
Start: 2018-01-27 — End: 2018-10-15

## 2018-01-27 NOTE — Telephone Encounter (Signed)
rx printed to accompany PA application

## 2018-05-19 ENCOUNTER — Other Ambulatory Visit (HOSPITAL_COMMUNITY): Payer: Self-pay | Admitting: Pharmacist

## 2018-05-19 MED ORDER — SACUBITRIL-VALSARTAN 97-103 MG PO TABS
1.0000 | ORAL_TABLET | Freq: Two times a day (BID) | ORAL | 3 refills | Status: DC
Start: 2018-05-19 — End: 2018-10-15

## 2018-05-20 ENCOUNTER — Telehealth (HOSPITAL_COMMUNITY): Payer: Self-pay | Admitting: Pharmacist

## 2018-05-20 NOTE — Telephone Encounter (Signed)
Entresto PA approved by [place] [employer] through 05/14/19.   Tyler Deis. Bonnye Fava, PharmD, BCPS, CPP Clinical Pharmacist Phone: [phone] 05/20/2018 11:13 AM

## 2018-06-09 ENCOUNTER — Other Ambulatory Visit (HOSPITAL_COMMUNITY): Payer: Self-pay | Admitting: Adult Health

## 2018-06-25 IMAGING — CR DG CHEST 2V
2 series · 2 of 2 positions shown · non-contrast
Comparison: Chest radiograph 05/13/2016

CLINICAL DATA: Chest pain, hemoptysis and vomiting

EXAM:
CHEST  2 VIEW

[w chest pa]
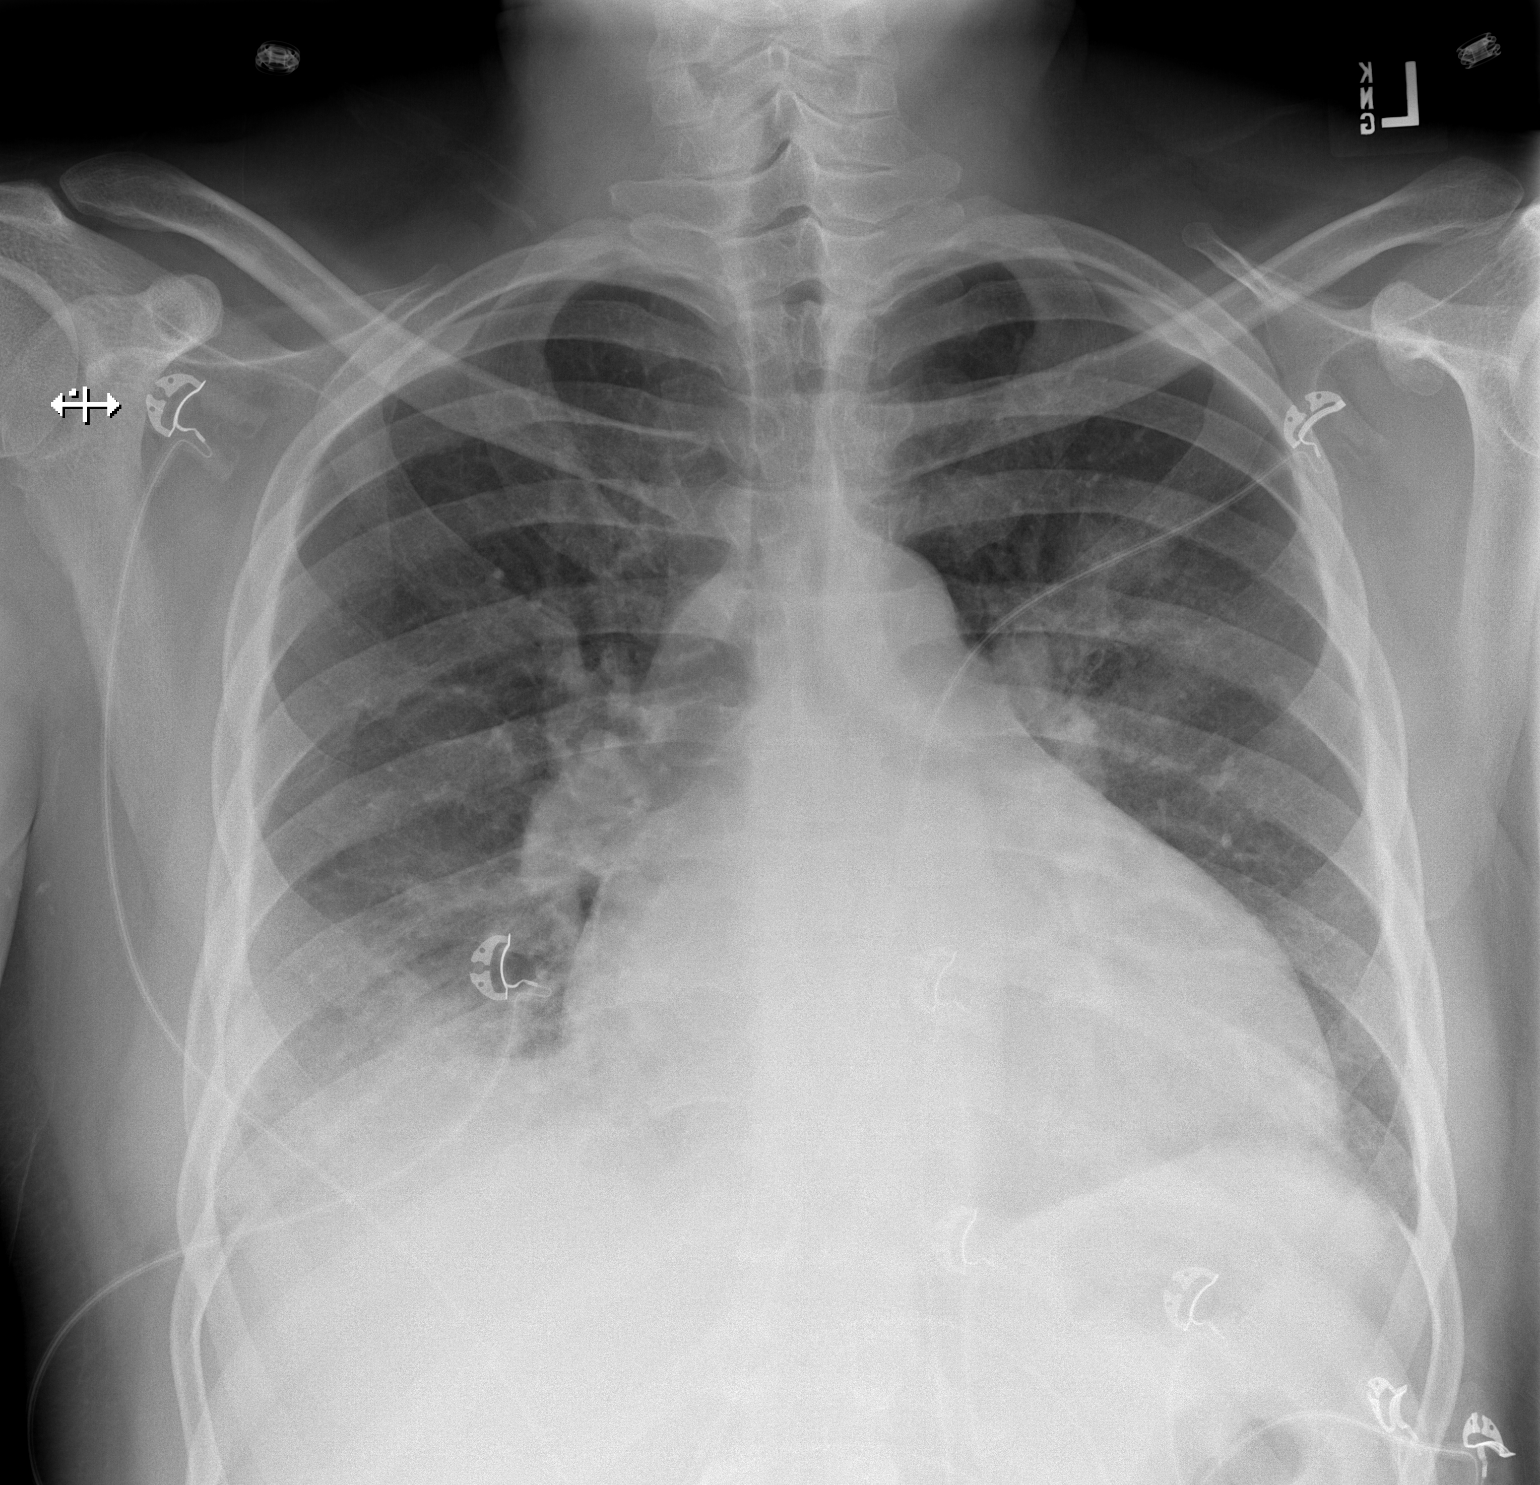

[w chest lat]
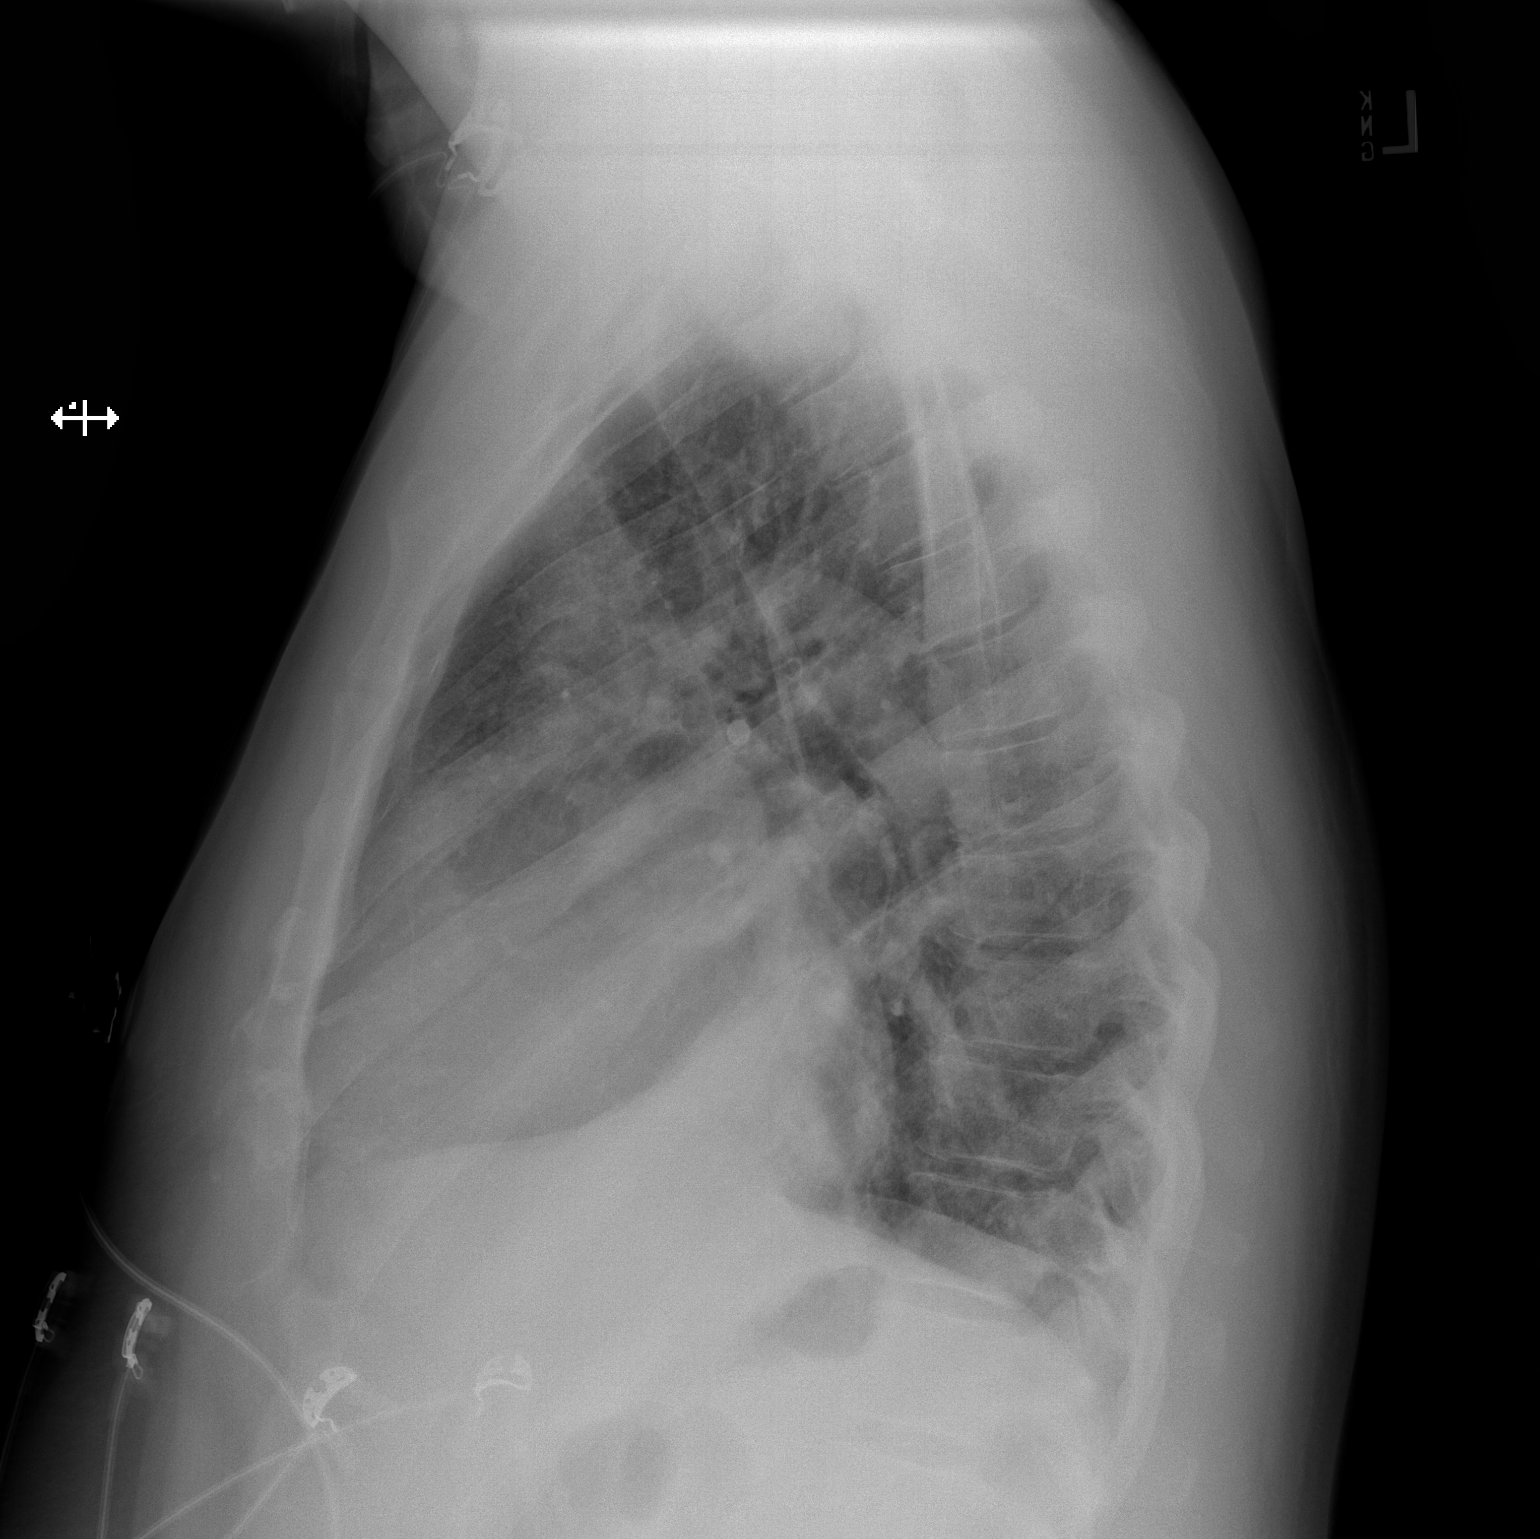

[2 of 2 positions shown; findings below may reference images not displayed]

FINDINGS: Massive enlargement of the cardiomediastinal silhouette is
unchanged. No pleural effusion or pneumothorax. There is right
middle lobe atelectasis. The left lung is clear. No pulmonary edema.
IMPRESSION: 1. Right middle lobe collapse. Superimposed infection would be
difficult to exclude.
2. Unchanged massive cardiomegaly versus pericardial effusion.

## 2018-10-01 DIAGNOSIS — H52223 Regular astigmatism, bilateral: Secondary | ICD-10-CM | POA: Diagnosis not present

## 2018-10-01 DIAGNOSIS — H5213 Myopia, bilateral: Secondary | ICD-10-CM | POA: Diagnosis not present

## 2018-10-15 ENCOUNTER — Other Ambulatory Visit (HOSPITAL_COMMUNITY): Payer: Self-pay | Admitting: *Deleted

## 2018-10-15 MED ORDER — DIGOXIN 125 MCG PO TABS: 125.0000 ug | ORAL_TABLET | Freq: Every day | ORAL | 3 refills | Status: DC

## 2018-10-15 MED ORDER — APIXABAN 5 MG PO TABS: 5.0000 mg | ORAL_TABLET | Freq: Two times a day (BID) | ORAL | 3 refills | Status: DC

## 2018-10-15 MED ORDER — CARVEDILOL 6.25 MG PO TABS: 6.2500 mg | ORAL_TABLET | Freq: Two times a day (BID) | ORAL | 0 refills | Status: DC

## 2018-10-15 MED ORDER — SACUBITRIL-VALSARTAN 97-103 MG PO TABS: 1.0000 | ORAL_TABLET | Freq: Two times a day (BID) | ORAL | 3 refills | Status: DC

## 2018-10-15 MED ORDER — SPIRONOLACTONE 25 MG PO TABS: 25.0000 mg | ORAL_TABLET | Freq: Every day | ORAL | 3 refills | Status: DC

## 2018-10-29 DIAGNOSIS — H5213 Myopia, bilateral: Secondary | ICD-10-CM | POA: Diagnosis not present

## 2018-11-17 ENCOUNTER — Encounter (HOSPITAL_COMMUNITY): Payer: Medicaid Other

## 2018-12-28 ENCOUNTER — Inpatient Hospital Stay (HOSPITAL_COMMUNITY): Admission: RE | Admit: 2018-12-28 | Payer: Medicaid Other | Source: Ambulatory Visit | Admitting: Internal Medicine

## 2019-04-20 ENCOUNTER — Encounter (HOSPITAL_COMMUNITY): Payer: Self-pay

## 2019-04-20 ENCOUNTER — Emergency Department (HOSPITAL_COMMUNITY)
Admission: EM | Admit: 2019-04-20 | Discharge: 2019-04-20 | Disposition: A | Discharge status: Home / Self Care | Payer: Medicaid Other | Attending: Emergency Medicine | Admitting: Emergency Medicine

## 2019-04-20 ENCOUNTER — Other Ambulatory Visit: Payer: Self-pay

## 2019-04-20 ENCOUNTER — Emergency Department (HOSPITAL_COMMUNITY): Payer: Medicaid Other

## 2019-04-20 DIAGNOSIS — I5022 Chronic systolic (congestive) heart failure: Secondary | ICD-10-CM | POA: Diagnosis not present

## 2019-04-20 DIAGNOSIS — Z79899 Other long term (current) drug therapy: Secondary | ICD-10-CM | POA: Insufficient documentation

## 2019-04-20 DIAGNOSIS — E860 Dehydration: Secondary | ICD-10-CM

## 2019-04-20 DIAGNOSIS — R0602 Shortness of breath: Secondary | ICD-10-CM | POA: Diagnosis not present

## 2019-04-20 DIAGNOSIS — E875 Hyperkalemia: Secondary | ICD-10-CM

## 2019-04-20 DIAGNOSIS — Z7901 Long term (current) use of anticoagulants: Secondary | ICD-10-CM | POA: Diagnosis not present

## 2019-04-20 DIAGNOSIS — Z20828 Contact with and (suspected) exposure to other viral communicable diseases: Secondary | ICD-10-CM | POA: Insufficient documentation

## 2019-04-20 DIAGNOSIS — J189 Pneumonia, unspecified organism: Secondary | ICD-10-CM

## 2019-04-20 DIAGNOSIS — R042 Hemoptysis: Secondary | ICD-10-CM

## 2019-04-20 DIAGNOSIS — E876 Hypokalemia: Secondary | ICD-10-CM | POA: Diagnosis not present

## 2019-04-20 DIAGNOSIS — R05 Cough: Secondary | ICD-10-CM | POA: Diagnosis not present

## 2019-04-20 LAB — CBC WITH DIFFERENTIAL/PLATELET
Abs Immature Granulocytes: 0.01 10*3/uL (ref 0.00–0.07)
Basophils Absolute: 0 10*3/uL (ref 0.0–0.1)
Basophils Relative: 0 %
Eosinophils Absolute: 0 10*3/uL (ref 0.0–0.5)
Eosinophils Relative: 1 %
HCT: 52.7 % — ABNORMAL HIGH (ref 39.0–52.0)
Hemoglobin: 18.1 g/dL — ABNORMAL HIGH (ref 13.0–17.0)
Immature Granulocytes: 0 %
Lymphocytes Relative: 21 %
Lymphs Abs: 0.8 10*3/uL (ref 0.7–4.0)
MCH: 30.5 pg (ref 26.0–34.0)
MCHC: 34.3 g/dL (ref 30.0–36.0)
MCV: 88.7 fL (ref 80.0–100.0)
Monocytes Absolute: 0.3 10*3/uL (ref 0.1–1.0)
Monocytes Relative: 9 %
Neutro Abs: 2.7 10*3/uL (ref 1.7–7.7)
Neutrophils Relative %: 69 %
Platelets: 326 10*3/uL (ref 150–400)
RBC: 5.94 MIL/uL — ABNORMAL HIGH (ref 4.22–5.81)
RDW: 14.9 % (ref 11.5–15.5)
WBC: 3.9 10*3/uL — ABNORMAL LOW (ref 4.0–10.5)
nRBC: 0 % (ref 0.0–0.2)

## 2019-04-20 LAB — BASIC METABOLIC PANEL
Anion gap: 11 (ref 5–15)
BUN: 50 mg/dL — ABNORMAL HIGH (ref 6–20)
CO2: 19 mmol/L — ABNORMAL LOW (ref 22–32)
Calcium: 8.8 mg/dL — ABNORMAL LOW (ref 8.9–10.3)
Chloride: 106 mmol/L (ref 98–111)
Creatinine, Ser: 1.91 mg/dL — ABNORMAL HIGH (ref 0.61–1.24)
GFR calc Af Amer: 49 mL/min — ABNORMAL LOW (ref >60)
GFR calc non Af Amer: 42 mL/min — ABNORMAL LOW (ref >60)
Glucose, Bld: 123 mg/dL — ABNORMAL HIGH (ref 70–99)
Potassium: 5.4 mmol/L — ABNORMAL HIGH (ref 3.5–5.1)
Sodium: 136 mmol/L (ref 135–145)

## 2019-04-20 LAB — DIGOXIN LEVEL: Digoxin Level: 0.2 ng/mL — ABNORMAL LOW (ref 0.8–2.0)

## 2019-04-20 MED ORDER — SODIUM ZIRCONIUM CYCLOSILICATE 10 G PO PACK
10.0000 g | PACK | Freq: Once | ORAL | Status: AC
  Administered 2019-04-20: 10 g via ORAL
  Filled 2019-04-20: qty 1

## 2019-04-20 MED ORDER — SODIUM CHLORIDE 0.9 % IV BOLUS: 1000.0000 mL | Freq: Once | INTRAVENOUS | Status: DC

## 2019-04-20 MED ORDER — DOXYCYCLINE HYCLATE 100 MG PO CAPS: 100.0000 mg | ORAL_CAPSULE | Freq: Two times a day (BID) | ORAL | 0 refills | Status: AC

## 2019-04-20 MED ORDER — DOXYCYCLINE HYCLATE 100 MG PO TABS
100.0000 mg | ORAL_TABLET | Freq: Once | ORAL | Status: AC
  Administered 2019-04-20: 20:00:00 100 mg via ORAL
  Filled 2019-04-20: qty 1

## 2019-04-20 MED ORDER — IOHEXOL 350 MG/ML SOLN
100.0000 mL | Freq: Once | INTRAVENOUS | Status: AC | PRN
  Administered 2019-04-20: 17:00:00 80 mL via INTRAVENOUS

## 2019-04-20 MED ORDER — SODIUM CHLORIDE 0.9 % IV BOLUS
1000.0000 mL | Freq: Once | INTRAVENOUS | Status: AC
  Administered 2019-04-20: 1000 mL via INTRAVENOUS

## 2019-04-20 NOTE — ED Provider Notes (Signed)
[facility]-EMERGENCY DEPT Provider Note   CSN: [id number] Arrival date & time: 04/20/19  1252     History   Chief Complaint Chief Complaint  Patient presents with   Cough    HPI Richard [name] is a 43 y.o. male with past medical history of CHF, cardiomyopathy, PE on Eliquis, presenting to the emergency department with complaint of hemoptysis for 2 weeks.  Patient states 2 weeks ago he began feeling ill with what he feels to be a viral infection.  He had cough with fever, body aches, diarrhea, shortness of breath.  He states after the cough began he began having some hemoptysis described as a small amount, intermittently with his cough.  He is unable to describe the color of the blood though states he knows it was blood.  Symptoms are improving, however he is still having the intermittent hemoptysis.  He is here for evaluation as he is concerned with his cardiac history.  He states he has not missed any doses of his Eliquis except for his morning dose this morning because he was coming to the ED.     The history is provided by the patient.    History reviewed. No pertinent past medical history.  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 No pertinent surgical history.      Home Medications    Prior to Admission medications   Medication Sig Start Date End Date Taking? Authorizing Provider  albuterol (VENTOLIN HFA) 108 (90 Base) MCG/ACT inhaler Inhale 1-2 puffs into the lungs every 6 (six) hours as needed for  wheezing or shortness of breath.   Yes [provider]  apixaban (ELIQUIS) 5 MG TABS tablet Take 1 tablet (5 mg total) by mouth 2 (two) times daily. 10/15/18  Yes Clegg, Amy D, NP  carvedilol (COREG) 6.25 MG tablet Take 1 tablet (6.25 mg total) by mouth 2 (two) times daily with a meal. 10/15/18  Yes Clegg, Amy D, NP  digoxin (LANOXIN) 0.125 MG tablet Take 1 tablet (125 mcg total) by mouth daily. 10/15/18  Yes Clegg, Amy D, NP  sacubitril-valsartan (ENTRESTO) 97-103 MG Take 1 tablet by mouth 2 (two) times daily. 10/15/18  Yes Clegg, Amy D, NP  spironolactone (ALDACTONE) 25 MG tablet Take 1 tablet (25 mg total) by mouth daily. 10/15/18 04/20/19 Yes Clegg, Amy D, NP  doxycycline (VIBRAMYCIN) 100 MG capsule Take 1 capsule (100 mg total) by mouth 2 (two) times daily for 7 days. 04/20/19 04/27/19  Reneta Niehaus, [place] N, PA-C    Family History Family History  Problem Relation Age of Onset   Other Mother        Neg family history of clotting disorder or heart disease   Clotting disorder Neg Hx    Rheumatologic disease Neg Hx     Social History Social History   Tobacco Use   Smoking status: Never Smoker   Smokeless tobacco: Never Used  Substance Use Topics   Alcohol use: Yes    Comment: rare   Drug use: No     Allergies   Patient has no known allergies.  Review of Systems Review of Systems  Constitutional: Positive for fever (resolved).  Respiratory: Positive for cough and shortness of breath.   Cardiovascular: Negative for chest pain and leg swelling.  Gastrointestinal: Negative for abdominal pain.  Hematological: Bruises/bleeds easily.  All other systems reviewed and are negative.    Physical Exam Updated Vital Signs BP 96/69    Pulse 90    Temp 98.2 F (36.8 C) (Oral)    Resp (!) 22    Wt 121.1 kg    SpO2 95%    BMI 30.08 kg/m   Physical Exam Vitals signs and nursing note reviewed.  Constitutional:      General: He is not in acute distress.    Appearance: He is  well-developed. He is not ill-appearing.  HENT:     Head: Normocephalic and atraumatic.  Eyes:     Conjunctiva/sclera: Conjunctivae normal.  Cardiovascular:     Rate and Rhythm: Normal rate and regular rhythm.  Pulmonary:     Effort: Pulmonary effort is normal. No respiratory distress.     Breath sounds: Normal breath sounds.  Abdominal:     General: Bowel sounds are normal.     Palpations: Abdomen is soft.     Tenderness: There is no abdominal tenderness.  Musculoskeletal:     Right lower leg: No edema.     Left lower leg: No edema.  Skin:    General: Skin is warm.  Neurological:     Mental Status: He is alert.  Psychiatric:        Behavior: Behavior normal.      ED Treatments / Results  Labs (all labs ordered are listed, but only abnormal results are displayed) Labs Reviewed  BASIC METABOLIC PANEL - Abnormal; Notable for the following components:      Result Value   Potassium 5.4 (*)    CO2 19 (*)    Glucose, Bld 123 (*)    BUN 50 (*)    Creatinine, Ser 1.91 (*)    Calcium 8.8 (*)    GFR calc non Af Amer 42 (*)    GFR calc Af Amer 49 (*)    All other components within normal limits  CBC WITH DIFFERENTIAL/PLATELET - Abnormal; Notable for the following components:   WBC 3.9 (*)    RBC 5.94 (*)    Hemoglobin 18.1 (*)    HCT 52.7 (*)    All other components within normal limits  DIGOXIN LEVEL - Abnormal; Notable for the following components:   Digoxin Level 0.2 (*)    All other components within normal limits  NOVEL CORONAVIRUS, NAA (HOSP ORDER, SEND-OUT TO REF LAB; TAT 18-24 HRS)    EKG EKG Interpretation  Date/Time:  Tuesday April 20 2019 14:54:28 EDT Ventricular Rate:  94 PR Interval:    QRS Duration: 99 QT Interval:  354 QTC Calculation: 443 R Axis:   51 Text Interpretation:  Sinus rhythm Left atrial enlargement Low voltage, extremity leads Since prior ECG, tw appear slighly more peaked Confirmed by Alvira Monday ([id number]) on 04/20/2019 5:37:56  PM   Radiology Dg Chest 2 View  Result Date: 04/20/2019 CLINICAL DATA:  Cough EXAM: CHEST - 2 VIEW COMPARISON:  June 17, 2016 FINDINGS: The heart size and mediastinal contours are within normal limits. Both lungs are clear. The visualized skeletal structures are unremarkable. IMPRESSION: No active cardiopulmonary disease. Electronically Signed   By: Jonna Clark M.D.   On: 04/20/2019 13:39   Ct Angio Chest Pe W/cm &/or Wo  Cm  Result Date: 04/20/2019 CLINICAL DATA:  Hemoptysis. SOB., high pretest probability pulmonary emboli EXAM: CT ANGIOGRAPHY CHEST WITH CONTRAST TECHNIQUE: Multidetector CT imaging of the chest was performed using the standard protocol during bolus administration of intravenous contrast. Multiplanar CT image reconstructions and MIPs were obtained to evaluate the vascular anatomy. CONTRAST:  80mL OMNIPAQUE IOHEXOL 350 MG/ML SOLN COMPARISON:  06/17/2016 FINDINGS: Cardiovascular: Heart size upper limits normal. No pericardial effusion. Satisfactory opacification of pulmonary arteries noted, and there is no evidence of pulmonary emboli. Adequate contrast opacification of the thoracic aorta with no evidence of dissection, aneurysm, or stenosis. There is classic 3-vessel brachiocephalic arch anatomy without proximal stenosis. No significant atheromatous change. Mediastinum/Nodes: Prevascular lymph node 1 cm short axis diameter, previously 1.6 cm. No hilar adenopathy. Lungs/Pleura: No pleural effusion. No pneumothorax. Patchy poorly marginated airspace opacities in the posterior left upper lobe, inferior lingula, superior segment left lower lobe. Very subtle ground-glass opacities in the lateral right upper lobe. Upper Abdomen: No acute findings. Musculoskeletal: Review of the MIP images confirms the above findings. IMPRESSION: 1. Negative for acute PE or thoracic aortic dissection. 2. Patchy poorly marginated airspace opacities in the left upper lobe, inferior lingula, and superior segment  left lower lobe, suspect infectious/inflammatory etiology. Follow-up recommended to confirm appropriate resolution. Electronically Signed   By: Corlis Leak  Hassell M.D.   On: 04/20/2019 17:17    Procedures Procedures (including critical care time)  Medications Ordered in ED Medications  doxycycline (VIBRA-TABS) tablet 100 mg (has no administration in time range)  iohexol (OMNIPAQUE) 350 MG/ML injection 100 mL (80 mLs Intravenous Contrast Given 04/20/19 1649)  sodium zirconium cyclosilicate (LOKELMA) packet 10 g (10 g Oral Given 04/20/19 1803)  sodium chloride 0.9 % bolus 1,000 mL (1,000 mLs Intravenous New Bag/Given 04/20/19 1801)     Initial Impression / Assessment and Plan / ED Course  I have reviewed the triage vital signs and the nursing notes.  Pertinent labs & imaging results that were available during my care of the patient were reviewed by me and considered in my medical decision making (see chart for details).  Clinical Course as of Apr 19 2012  Tue Apr 20, 2019  1730 Patient's labs and imaging discussed with Dr. Dalene SeltzerSchlossman in [place].  Old records reviewed.  Patient's lab work and history are just dehydration.  Will provide 1 L of fluids, as well as Lokelma for mild hyperkalemia as patient has some mild new T wave changes.  Will repeat EKG with anticipated discharge.  Will send with antibiotics to cover community-acquired pneumonia, COVID test, and recommend patient hold his spironolactone for a day or 2 to help with dehydration and hyperkalemia.   [JR]  1745 Discussed work-up and plan with patient, he is agreeable.  Patient remains well-appearing and in no distress.   [JR]    Clinical Course User Index [JR] Alonnie Bieker, [place] N, PA-C       Patient presenting for small hemoptysis and some shortness of breath after recent illness.  Patient has cardiac history and history of PE, on Eliquis.  He states he is only missed his morning dose of Eliquis today, however did not miss any doses during  his illness.  His exam is overall benign, vital signs are normal.  Normal work of breathing.  Chest x-ray is negative.  Discussed with Dr. Dalene SeltzerSchlossman, given patient's history, will proceed with imaging to rule out recurrent PE.  CT scan is negative for PE though is showing some airspace opacities in the left lung.  Patient remains stable and in no distress, well-appearing.  Labs with mild hyperkalemia at 5.4.  AKI is present along with hemoconcentration.  Given patient's presentation, most consistent with dehydration.  EKG with very mild changes in T waves.  Discussed with attending.  Will provide IV fluids, Lokelma and repeat EKG with anticipated discharge.  Repeat EKG is reassuring.  I believe patient can be managed on outpatient basis.  Will discharge per plan with course of antibiotics to cover community-acquired pneumonia, COVID test sent.  Instructed patient to hold spironolactone for 1 or 2 days to help with dehydration and hyperkalemia.  Instructed follow closely with PCP in 2 days for electrolyte recheck.  Patient is well-appearing and in no distress, safe for discharge at this time.  Return precautions discussed.  Discussed patient with Dr. Billy Fischer, who agrees with workup and care plan.  Discussed results, findings, treatment and follow up. Patient advised of return precautions. Patient verbalized understanding and agreed with plan.  Final Clinical Impressions(s) / ED Diagnoses   Final diagnoses:  Cough with hemoptysis  Hyperkalemia  Dehydration  Community acquired pneumonia of left lung, unspecified part of lung    ED Discharge Orders         Ordered    doxycycline (VIBRAMYCIN) 100 MG capsule  2 times daily     04/20/19 2003           Vito Beg, [place] N, PA-C 04/20/19 2014    Gareth Morgan, MD 04/22/19 1214

## 2019-04-20 NOTE — Discharge Instructions (Addendum)
It is important you stay hydrated. Please follow-up closely with your primary care provider in 2 days for recheck of your electrolyte levels. Do not take your spironolactone for a day or 2 following today to help with your dehydration and potassium levels. Take the antibiotic, doxycycline, starting tomorrow as prescribed until gone. It is recommended you self isolate until you know your COVID test results. Return to the emergency department if you develop worsening shortness of breath, worsening blood in your sputum, or new or concerning symptoms.     Person Under Monitoring Name: Richard [name]  Location: [address] [place] [address]   Infection Prevention Recommendations for Individuals Confirmed to have, or Being Evaluated for, 2019 Novel Coronavirus (COVID-19) Infection Who Receive Care at Home  Individuals who are confirmed to have, or are being evaluated for, COVID-19 should follow the prevention steps below until a healthcare provider or local or state health department says they can return to normal activities.  Stay home except to get medical care You should restrict activities outside your home, except for getting medical care. Do not go to work, school, or public areas, and do not use public transportation or taxis.  Call ahead before visiting your doctor Before your medical appointment, call the healthcare provider and tell them that you have, or are being evaluated for, COVID-19 infection. This will help the healthcare providers office take steps to keep other people from getting infected. Ask your healthcare provider to call the local or state health department.  Monitor your symptoms Seek prompt medical attention if your illness is worsening (e.g., difficulty breathing). Before going to your medical appointment, call the healthcare provider and tell them that you have, or are being evaluated for, COVID-19 infection. Ask your healthcare provider to call the  local or state health department.  Wear a facemask You should wear a facemask that covers your nose and mouth when you are in the same room with other people and when you visit a healthcare provider. People who live with or visit you should also wear a facemask while they are in the same room with you.  Separate yourself from other people in your home As much as possible, you should stay in a different room from other people in your home. Also, you should use a separate bathroom, if available.  Avoid sharing household items You should not share dishes, drinking glasses, cups, eating utensils, towels, bedding, or other items with other people in your home. After using these items, you should wash them thoroughly with soap and water.  Cover your coughs and sneezes Cover your mouth and nose with a tissue when you cough or sneeze, or you can cough or sneeze into your sleeve. Throw used tissues in a lined trash can, and immediately wash your hands with soap and water for at least 20 seconds or use an alcohol-based hand rub.  Wash your [employer] your hands often and thoroughly with soap and water for at least 20 seconds. You can use an alcohol-based hand sanitizer if soap and water are not available and if your hands are not visibly dirty. Avoid touching your eyes, nose, and mouth with unwashed hands.   Prevention Steps for Caregivers and Household Members of Individuals Confirmed to have, or Being Evaluated for, COVID-19 Infection Being Cared for in the Home  If you live with, or provide care at home for, a person confirmed to have, or being evaluated for, COVID-19 infection please follow these guidelines to prevent  infection:  Follow healthcare providers instructions Make sure that you understand and can help the patient follow any healthcare provider instructions for all care.  Provide for the patients basic needs You should help the patient with basic needs in the home and  provide support for getting groceries, prescriptions, and other personal needs.  Monitor the patients symptoms If they are getting sicker, call his or her medical provider and tell them that the patient has, or is being evaluated for, COVID-19 infection. This will help the healthcare providers office take steps to keep other people from getting infected. Ask the healthcare provider to call the local or state health department.  Limit the number of people who have contact with the patient If possible, have only one caregiver for the patient. Other household members should stay in another home or place of residence. If this is not possible, they should stay in another room, or be separated from the patient as much as possible. Use a separate bathroom, if available. Restrict visitors who do not have an essential need to be in the home.  Keep older adults, very young children, and other sick people away from the patient Keep older adults, very young children, and those who have compromised immune systems or chronic health conditions away from the patient. This includes people with chronic heart, lung, or kidney conditions, diabetes, and cancer.  Ensure good ventilation Make sure that shared spaces in the home have good air flow, such as from an air conditioner or an opened window, weather permitting.  Wash your hands often Wash your hands often and thoroughly with soap and water for at least 20 seconds. You can use an alcohol based hand sanitizer if soap and water are not available and if your hands are not visibly dirty. Avoid touching your eyes, nose, and mouth with unwashed hands. Use disposable paper towels to dry your hands. If not available, use dedicated cloth towels and replace them when they become wet.  Wear a facemask and gloves Wear a disposable facemask at all times in the room and gloves when you touch or have contact with the patients blood, body fluids, and/or secretions or  excretions, such as sweat, saliva, sputum, nasal mucus, vomit, urine, or feces.  Ensure the mask fits over your nose and mouth tightly, and do not touch it during use. Throw out disposable facemasks and gloves after using them. Do not reuse. Wash your hands immediately after removing your facemask and gloves. If your personal clothing becomes contaminated, carefully remove clothing and launder. Wash your hands after handling contaminated clothing. Place all used disposable facemasks, gloves, and other waste in a lined container before disposing them with other household waste. Remove gloves and wash your hands immediately after handling these items.  Do not share dishes, glasses, or other household items with the patient Avoid sharing household items. You should not share dishes, drinking glasses, cups, eating utensils, towels, bedding, or other items with a patient who is confirmed to have, or being evaluated for, COVID-19 infection. After the person uses these items, you should wash them thoroughly with soap and water.  Wash laundry thoroughly Immediately remove and wash clothes or bedding that have blood, body fluids, and/or secretions or excretions, such as sweat, saliva, sputum, nasal mucus, vomit, urine, or feces, on them. Wear gloves when handling laundry from the patient. Read and follow directions on labels of laundry or clothing items and detergent. In general, wash and dry with the warmest temperatures recommended on the  label.  Clean all areas the individual has used often Clean all touchable surfaces, such as counters, tabletops, doorknobs, bathroom fixtures, toilets, phones, keyboards, tablets, and bedside tables, every day. Also, clean any surfaces that may have blood, body fluids, and/or secretions or excretions on them. Wear gloves when cleaning surfaces the patient has come in contact with. Use a diluted bleach solution (e.g., dilute bleach with 1 part bleach and 10 parts water)  or a household disinfectant with a label that says EPA-registered for coronaviruses. To make a bleach solution at home, add 1 tablespoon of bleach to 1 quart (4 cups) of water. For a larger supply, add  cup of bleach to 1 gallon (16 cups) of water. Read labels of cleaning products and follow recommendations provided on product labels. Labels contain instructions for safe and effective use of the cleaning product including precautions you should take when applying the product, such as wearing gloves or eye protection and making sure you have good ventilation during use of the product. Remove gloves and wash hands immediately after cleaning.  Monitor yourself for signs and symptoms of illness Caregivers and household members are considered close contacts, should monitor their health, and will be asked to limit movement outside of the home to the extent possible. Follow the monitoring steps for close contacts listed on the symptom monitoring form.   ? If you have additional questions, contact your local health department or call the epidemiologist on call at [phone] (available 24/7). ? This guidance is subject to change. For the most up-to-date guidance from [facility], please refer to their website: [email or web address]

## 2019-04-20 NOTE — ED Triage Notes (Signed)
Pt states that he has been feeling weak and coughing up some blood. Pt states that he felt worse last week, but wanted to make sure nothing serious was going on.

## 2019-04-22 LAB — NOVEL CORONAVIRUS, NAA (HOSP ORDER, SEND-OUT TO REF LAB; TAT 18-24 HRS): SARS-CoV-2, NAA: NOT DETECTED

## 2019-06-30 ENCOUNTER — Other Ambulatory Visit (HOSPITAL_COMMUNITY): Payer: Self-pay | Admitting: Adult Health

## 2019-07-05 ENCOUNTER — Telehealth (HOSPITAL_COMMUNITY): Payer: Self-pay | Admitting: Pharmacy Technician

## 2019-07-05 NOTE — Telephone Encounter (Signed)
Patient Advocate Encounter   Received notification from Medicaid that prior authorization for Delene Loll is required.   PA submitted on CoverMyMeds Key [id number] W Status is pending   Will continue to follow.  Charlann Boxer, CPhT

## 2019-07-07 NOTE — Telephone Encounter (Addendum)
Advanced Heart Failure Patient Advocate Encounter  Prior Authorization for Delene Loll has been approved.    PA# [id number] Effective dates: 07/05/2019 through 06/29/2020  Patients co-pay is $3.00  Tried to call patient about approval, no way to leave message. Called patient's pharmacy and they were able to get a paid claim.  Charlann Boxer, CPhT

## 2019-08-06 ENCOUNTER — Encounter (HOSPITAL_COMMUNITY): Payer: Medicaid Other | Admitting: Internal Medicine

## 2019-11-08 ENCOUNTER — Other Ambulatory Visit (HOSPITAL_COMMUNITY): Payer: Self-pay | Admitting: Adult Health

## 2019-11-08 ENCOUNTER — Other Ambulatory Visit (HOSPITAL_COMMUNITY): Payer: Self-pay | Admitting: Internal Medicine

## 2019-11-25 ENCOUNTER — Other Ambulatory Visit (HOSPITAL_COMMUNITY): Payer: Self-pay | Admitting: Adult Health

## 2019-11-25 MED ORDER — ENTRESTO 97-103 MG PO TABS: 1.0000 | ORAL_TABLET | Freq: Two times a day (BID) | ORAL | 0 refills | Status: DC

## 2019-11-25 NOTE — Addendum Note (Signed)
Addended byRob Bunting R on: 11/25/2019 04:08 PM   Modules accepted: Orders

## 2019-11-26 ENCOUNTER — Other Ambulatory Visit (HOSPITAL_COMMUNITY): Payer: Self-pay | Admitting: Adult Health

## 2020-02-17 ENCOUNTER — Inpatient Hospital Stay (HOSPITAL_COMMUNITY)
Admission: RE | Admit: 2020-02-17 | Discharge: 2020-02-17 | Disposition: A | Discharge status: Home / Self Care | Payer: Medicaid Other | Source: Ambulatory Visit | Attending: Internal Medicine | Admitting: Internal Medicine

## 2020-02-17 NOTE — Progress Notes (Signed)
Pt agreeable and verbalized understanding

## 2020-03-16 ENCOUNTER — Other Ambulatory Visit (HOSPITAL_COMMUNITY): Payer: Self-pay | Admitting: Internal Medicine

## 2020-03-16 ENCOUNTER — Other Ambulatory Visit (HOSPITAL_COMMUNITY): Payer: Self-pay | Admitting: Adult Health

## 2020-03-16 ENCOUNTER — Other Ambulatory Visit (HOSPITAL_COMMUNITY): Payer: Self-pay | Admitting: Cardiology

## 2020-05-23 ENCOUNTER — Other Ambulatory Visit (HOSPITAL_COMMUNITY): Payer: Self-pay | Admitting: Internal Medicine

## 2020-05-23 ENCOUNTER — Other Ambulatory Visit (HOSPITAL_COMMUNITY): Payer: Self-pay | Admitting: Cardiology

## 2020-06-05 ENCOUNTER — Other Ambulatory Visit (HOSPITAL_COMMUNITY): Payer: Self-pay | Admitting: Cardiology

## 2020-06-05 ENCOUNTER — Other Ambulatory Visit (HOSPITAL_COMMUNITY): Payer: Self-pay | Admitting: Internal Medicine

## 2020-06-07 ENCOUNTER — Other Ambulatory Visit (HOSPITAL_COMMUNITY): Payer: Self-pay | Admitting: Cardiology

## 2020-06-07 ENCOUNTER — Other Ambulatory Visit (HOSPITAL_COMMUNITY): Payer: Self-pay | Admitting: Internal Medicine

## 2020-06-12 ENCOUNTER — Telehealth (HOSPITAL_COMMUNITY): Payer: Self-pay | Admitting: Internal Medicine

## 2020-06-12 MED ORDER — ENTRESTO 97-103 MG PO TABS: 1.0000 | ORAL_TABLET | Freq: Two times a day (BID) | ORAL | 0 refills | Status: DC

## 2020-06-12 MED ORDER — CARVEDILOL 6.25 MG PO TABS: 6.2500 mg | ORAL_TABLET | Freq: Two times a day (BID) | ORAL | 0 refills | Status: DC

## 2020-06-12 NOTE — Telephone Encounter (Signed)
Pt request Entresto and Carvedilol refill, please send script to [facility], pt scheduled appt 12/10 with DB

## 2020-07-07 ENCOUNTER — Ambulatory Visit (HOSPITAL_COMMUNITY)
Admission: RE | Admit: 2020-07-07 | Discharge: 2020-07-07 | Disposition: A | Discharge status: Home / Self Care | Payer: Medicaid Other | Source: Ambulatory Visit | Attending: Internal Medicine | Admitting: Internal Medicine

## 2020-07-07 ENCOUNTER — Encounter (HOSPITAL_COMMUNITY): Payer: Self-pay | Admitting: Internal Medicine

## 2020-07-07 ENCOUNTER — Other Ambulatory Visit: Payer: Self-pay

## 2020-07-07 VITALS — BP 150/90 | HR 85 | Wt 326.0 lb

## 2020-07-07 DIAGNOSIS — Z7901 Long term (current) use of anticoagulants: Secondary | ICD-10-CM | POA: Insufficient documentation

## 2020-07-07 DIAGNOSIS — E669 Obesity, unspecified: Secondary | ICD-10-CM | POA: Insufficient documentation

## 2020-07-07 DIAGNOSIS — Z79899 Other long term (current) drug therapy: Secondary | ICD-10-CM | POA: Insufficient documentation

## 2020-07-07 DIAGNOSIS — I5022 Chronic systolic (congestive) heart failure: Secondary | ICD-10-CM | POA: Insufficient documentation

## 2020-07-07 DIAGNOSIS — I2699 Other pulmonary embolism without acute cor pulmonale: Secondary | ICD-10-CM | POA: Diagnosis not present

## 2020-07-07 DIAGNOSIS — I428 Other cardiomyopathies: Secondary | ICD-10-CM | POA: Insufficient documentation

## 2020-07-07 DIAGNOSIS — R110 Nausea: Secondary | ICD-10-CM | POA: Diagnosis not present

## 2020-07-07 DIAGNOSIS — R0683 Snoring: Secondary | ICD-10-CM | POA: Insufficient documentation

## 2020-07-07 HISTORY — DX: Heart failure, unspecified: I50.9

## 2020-07-07 LAB — BASIC METABOLIC PANEL
Anion gap: 11 (ref 5–15)
BUN: 11 mg/dL (ref 6–20)
CO2: 22 mmol/L (ref 22–32)
Calcium: 8.7 mg/dL — ABNORMAL LOW (ref 8.9–10.3)
Chloride: 108 mmol/L (ref 98–111)
Creatinine, Ser: 1.11 mg/dL (ref 0.61–1.24)
GFR, Estimated: >60 mL/min (ref >60)
Glucose, Bld: 113 mg/dL — ABNORMAL HIGH (ref 70–99)
Potassium: 4.3 mmol/L (ref 3.5–5.1)
Sodium: 141 mmol/L (ref 135–145)

## 2020-07-07 LAB — HEMOGLOBIN A1C
Hgb A1c MFr Bld: 6.7 % — ABNORMAL HIGH (ref 4.8–5.6)
Mean Plasma Glucose: 145.59 mg/dL

## 2020-07-07 LAB — BRAIN NATRIURETIC PEPTIDE: B Natriuretic Peptide: 3549 pg/mL — ABNORMAL HIGH (ref 0.0–100.0)

## 2020-07-07 MED ORDER — SPIRONOLACTONE 25 MG PO TABS: 25.0000 mg | ORAL_TABLET | Freq: Every day | ORAL | 3 refills | Status: DC

## 2020-07-07 MED ORDER — DIGOXIN 125 MCG PO TABS: 125.0000 ug | ORAL_TABLET | Freq: Every day | ORAL | 3 refills | Status: DC

## 2020-07-07 MED ORDER — ENTRESTO 97-103 MG PO TABS: 1.0000 | ORAL_TABLET | Freq: Two times a day (BID) | ORAL | 0 refills | Status: DC

## 2020-07-07 MED ORDER — CARVEDILOL 6.25 MG PO TABS: 6.2500 mg | ORAL_TABLET | Freq: Two times a day (BID) | ORAL | 3 refills | Status: DC

## 2020-07-07 MED ORDER — APIXABAN 5 MG PO TABS: 5.0000 mg | ORAL_TABLET | Freq: Two times a day (BID) | ORAL | 3 refills | Status: DC

## 2020-07-07 NOTE — Patient Instructions (Signed)
It was great to see you today! No medication changes are needed at this time. All your medications have been refilled and returned to your pharmacy  Your physician recommends that you schedule a follow-up appointment in: 4 months with Dr Gala Romney and echo -please call in March for a April 2022 appt  Your physician has requested that you have an echocardiogram. Echocardiography is a painless test that uses sound waves to create images of your heart. It provides your doctor with information about the size and shape of your heart and how well your heart's chambers and valves are working. This procedure takes approximately one hour. There are no restrictions for this procedure.  If you have any questions or concerns before your next appointment please send [place] a message through [place] or call our office at [phone].    TO LEAVE A MESSAGE FOR THE NURSE SELECT OPTION 2, PLEASE LEAVE A MESSAGE INCLUDING: . YOUR NAME . DATE OF BIRTH . CALL BACK NUMBER . REASON FOR CALL**this is important as we prioritize the call backs  YOU WILL RECEIVE A CALL BACK THE SAME DAY AS LONG AS YOU CALL BEFORE 4:00 PM

## 2020-07-07 NOTE — Progress Notes (Signed)
Advanced Heart Failure Clinic Note   PCP: None Primary HF Cardiologist: Dr Gala Romney  HPI: Richard [name] is a 44 y.o. obese male with bilateral PEs and systolic HF diagnosed in 2017 with EF 10-15% Subsequent echo 4/18 EF 25-30%  He presents today for follow up. He has not been seen in clinic in almost 3 years. Not feeling great but "OK". SOB with walking fast. No CP, dizziness, edema, or PND/orthopnea. Feels heart is racing sometimes. Thinks he snores and has daytime sleepiness. Working out 5 days/week, 30 minute walk and some lifting. Drinking less than 2 L fluid a day. Adamant he is not getting COVID vaccine. About 3 weeks ago stopped taking spiro, dig and evening doses of Eliquis, Entresto, and Coreg because he was nauseated in the evenings. 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  Bedside echo in clinic today EF 20-25%   ECHO 11/20/16 EF 25-30% Probable small laminated LV thrombus   ECHO 05/2016: EF 10-15%. RV dilated LV thrombus    FH: Negative for heart disease SH: No alcohol or drug use.   Review of systems complete and found to be negative unless listed in HPI.    SH:  Social History   Socioeconomic History  . Marital status: Single    Spouse name: Not on file  . Number of children: Not on file  . Years of education: Not on file  . Highest education level: Not on file  Occupational History  . Not on file  Tobacco Use  . Smoking status: Never Smoker  . Smokeless tobacco: Never Used  Substance and Sexual Activity  . Alcohol use: Yes    Comment: rare  . Drug use: No  . Sexual activity: Not on file  Other Topics Concern  . Not on file  Social History Narrative  . Not on file   Social Determinants of Health   Financial Resource Strain: Not on file  Food Insecurity: Not on file  Transportation Needs: Not on file  Physical Activity: Not on file  Stress: Not on file  Social Connections: Not on file   Intimate Partner Violence: Not on file    FH:  Family History  Problem Relation Age of Onset  . Other Mother        Neg family history of clotting disorder or heart disease  . Clotting disorder Neg Hx   . Rheumatologic disease Neg Hx     Past Medical History:  Diagnosis Date  . CHF (congestive heart failure) (HCC)     Current Outpatient Medications  Medication Sig Dispense Refill  . apixaban (ELIQUIS) 5 MG TABS tablet Take 1 tablet (5 mg total) by mouth 2 (two) times daily. 60 tablet 3  . carvedilol (COREG) 6.25 MG tablet Take 1 tablet (6.25 mg total) by mouth 2 (two) times daily with a meal. 60 tablet 3  . digoxin (LANOXIN) 0.125 MG tablet Take 1 tablet (125 mcg total) by mouth daily. 30 tablet 3  . sacubitril-valsartan (ENTRESTO) 97-103 MG Take 1 tablet by mouth 2 (two) times daily. LAST REFILL. MUST KEEP OFFICE VISIT FOR FURTHER REFILLS. 60 tablet 0  . spironolactone (ALDACTONE) 25 MG tablet Take 1 tablet (25 mg total) by mouth daily. 30 tablet 3   No current facility-administered medications for this encounter.   Vitals:   07/07/20 1507  BP: (!) 150/90  Pulse: 85  SpO2: 98%  Weight: (!) 147.9 kg     Wt Readings from  Last 3 Encounters:  07/07/20 (!) 147.9 kg  04/20/19 121.1 kg  08/07/17 (!) 157.8 kg   ECG: SR 97 bpm (personally reviewed).  PHYSICAL EXAM: General: Well appearing. No resp difficulty. HEENT: chalazion to left lower lid otherwise normal Neck: Supple. JVP ~6. Carotids 2+ bilat; no bruits. No thyromegaly or nodule noted. Cor: PMI nondisplaced. RRR, No M/G/R  Lungs: CTAB, normal effort. Abdomen: obese, soft, non-tender, non-distended, no HSM. No bruits or masses. +BS  Extremities: No cyanosis, clubbing, or rash. No edema.  Neuro: Alert & oriented x 3, cranial nerves grossly intact. moves all 4 extremities w/o difficulty. Affect pleasant   ASSESSMENT & PLAN:  1. Bilateral PE  - CTA 11/17  - No bleeding problems. No CP, occasional SOB.  -  Stressed importance of taking this BID.Based on Amplify-EXT data can consider dropping to 2.5 bid  2. Chronic Systolic Heart Failure - Echo 16/10/96 EF 10-15%. With LV thrombus. 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  - Echo 10/2016 with EF 25-30% laminated LV thrombus. Cannot exclude non-compaction.  - Bedside echo today EF ~20-25% - NYHA II symptoms by patient report. Volume status appears mildly elevated - Resume Entresto 97/103 mg BID.  - Resume spiro 25 mg daily. - Resume carvedilol 6.25 BID.  - Resume digoxin 0.125 mg daily. - Reinforced fluid restriction to < 2 L daily, sodium restriction to less than 2000 mg daily, and the importance of daily weights. - BMET & BNP level today - f/u 4-6 weeks  3. Large LV thrombus - Noted on Echo on 05/2016 - Stable on Eliquis 5 mg BID. Denies bleeding.  - Not present on bedside echo today  4. Nausea - No reflux symptoms. Not on PPI. - Encouraged diet log.  - F/U with PCP. - Check A1C today.  5. Snoring - needs sleep study but he wants to defer.   Arvilla Meres, MD  4:01 PM

## 2020-07-07 NOTE — Progress Notes (Signed)
ReDS Vest / Clip - 07/07/20 1500      ReDS Vest / Clip   Station Marker D    Ruler Value 41.5    ReDS Value Range High volume overload    ReDS Actual Value 47

## 2020-07-17 ENCOUNTER — Telehealth (HOSPITAL_COMMUNITY): Payer: Self-pay | Admitting: Pharmacy Technician

## 2020-07-17 NOTE — Telephone Encounter (Signed)
Patient Advocate Encounter   Received notification from [facility] that prior authorization for Sherryll Burger is required.   PA submitted on CoverMyMeds Key [id number] Status is pending   Will continue to follow.

## 2020-09-01 NOTE — Telephone Encounter (Signed)
Advanced Heart Failure Patient Advocate Encounter  Prior Authorization for Sherryll Burger has been approved.    PA# [id number] Effective dates: 07/17/20 through 07/17/21

## 2020-09-04 ENCOUNTER — Other Ambulatory Visit (HOSPITAL_COMMUNITY): Payer: Self-pay | Admitting: Internal Medicine

## 2020-10-23 ENCOUNTER — Other Ambulatory Visit (HOSPITAL_COMMUNITY): Payer: Self-pay | Admitting: Internal Medicine

## 2020-12-05 ENCOUNTER — Other Ambulatory Visit (HOSPITAL_COMMUNITY): Payer: Self-pay | Admitting: Internal Medicine

## 2020-12-21 ENCOUNTER — Encounter (HOSPITAL_COMMUNITY): Payer: Medicaid Other | Admitting: Internal Medicine

## 2021-01-03 ENCOUNTER — Other Ambulatory Visit (HOSPITAL_COMMUNITY): Payer: Self-pay | Admitting: Internal Medicine

## 2021-02-19 ENCOUNTER — Other Ambulatory Visit (HOSPITAL_COMMUNITY): Payer: Self-pay | Admitting: Internal Medicine

## 2021-03-07 ENCOUNTER — Other Ambulatory Visit: Payer: Self-pay

## 2021-03-07 ENCOUNTER — Encounter (HOSPITAL_COMMUNITY): Payer: Self-pay | Admitting: Internal Medicine

## 2021-03-07 ENCOUNTER — Ambulatory Visit (HOSPITAL_COMMUNITY)
Admission: RE | Admit: 2021-03-07 | Discharge: 2021-03-07 | Disposition: A | Discharge status: Home / Self Care | Payer: Medicaid Other | Source: Ambulatory Visit | Attending: Internal Medicine | Admitting: Internal Medicine

## 2021-03-07 VITALS — BP 108/78 | HR 81 | Wt 302.0 lb

## 2021-03-07 DIAGNOSIS — Z79899 Other long term (current) drug therapy: Secondary | ICD-10-CM | POA: Diagnosis not present

## 2021-03-07 DIAGNOSIS — I5022 Chronic systolic (congestive) heart failure: Secondary | ICD-10-CM | POA: Diagnosis not present

## 2021-03-07 DIAGNOSIS — Z7901 Long term (current) use of anticoagulants: Secondary | ICD-10-CM | POA: Diagnosis not present

## 2021-03-07 DIAGNOSIS — I2699 Other pulmonary embolism without acute cor pulmonale: Secondary | ICD-10-CM

## 2021-03-07 DIAGNOSIS — I428 Other cardiomyopathies: Secondary | ICD-10-CM | POA: Insufficient documentation

## 2021-03-07 DIAGNOSIS — R0683 Snoring: Secondary | ICD-10-CM | POA: Diagnosis not present

## 2021-03-07 LAB — CBC
HCT: 40.3 % (ref 39.0–52.0)
Hemoglobin: 13.5 g/dL (ref 13.0–17.0)
MCH: 30.4 pg (ref 26.0–34.0)
MCHC: 33.5 g/dL (ref 30.0–36.0)
MCV: 90.8 fL (ref 80.0–100.0)
Platelets: 217 10*3/uL (ref 150–400)
RBC: 4.44 MIL/uL (ref 4.22–5.81)
RDW: 13.8 % (ref 11.5–15.5)
WBC: 2.8 10*3/uL — ABNORMAL LOW (ref 4.0–10.5)
nRBC: 0 % (ref 0.0–0.2)

## 2021-03-07 LAB — BASIC METABOLIC PANEL
Anion gap: 7 (ref 5–15)
BUN: 9 mg/dL (ref 6–20)
CO2: 25 mmol/L (ref 22–32)
Calcium: 9.2 mg/dL (ref 8.9–10.3)
Chloride: 103 mmol/L (ref 98–111)
Creatinine, Ser: 0.8 mg/dL (ref 0.61–1.24)
GFR, Estimated: >60 mL/min (ref >60)
Glucose, Bld: 119 mg/dL — ABNORMAL HIGH (ref 70–99)
Potassium: 3.9 mmol/L (ref 3.5–5.1)
Sodium: 135 mmol/L (ref 135–145)

## 2021-03-07 LAB — BRAIN NATRIURETIC PEPTIDE: B Natriuretic Peptide: 303.7 pg/mL — ABNORMAL HIGH (ref 0.0–100.0)

## 2021-03-07 NOTE — Patient Instructions (Signed)
Stop Digoxin  Labs done today, we will call you for abnormal results  Your physician has requested that you have an echocardiogram. Echocardiography is a painless test that uses sound waves to create images of your heart. It provides your doctor with information about the size and shape of your heart and how well your heart's chambers and valves are working. This procedure takes approximately one hour. There are no restrictions for this procedure.  Your physician recommends that you schedule a follow-up appointment in: 4 months  Do the following things EVERYDAY: Weigh yourself in the morning before breakfast. Write it down and keep it in a log. Take your medicines as prescribed Eat low salt foods--Limit salt (sodium) to 2000 mg per day.  Stay as active as you can everyday Limit all fluids for the day to less than 2 liters  If you have any questions or concerns before your next appointment please send [place] a message through [place] or call our office at [phone].    TO LEAVE A MESSAGE FOR THE NURSE SELECT OPTION 2, PLEASE LEAVE A MESSAGE INCLUDING: YOUR NAME DATE OF BIRTH CALL BACK NUMBER REASON FOR CALL**this is important as we prioritize the call backs  YOU WILL RECEIVE A CALL BACK THE SAME DAY AS LONG AS YOU CALL BEFORE 4:00 PM  milAt the [facility], you and your health needs are our priority. As part of our continuing mission to provide you with exceptional heart care, we have created designated Provider Care Teams. These Care Teams include your primary Cardiologist (physician) and Advanced Practice Providers (APPs- Physician Assistants and Nurse Practitioners) who all work together to provide you with the care you need, when you need it.   You may see any of the following providers on your designated Care Team at your next follow up: Dr Arvilla Meres Dr Marca Ancona Dr Brandon Melnick, NP Robbie Lis, [place] Mikki Santee Karle Plumber,  PharmD   Please be sure to bring in all your medications bottles to every appointment.

## 2021-03-07 NOTE — Addendum Note (Signed)
Encounter addended by: Noralee Space, RN on: 03/07/2021 10:57 AM  Actions taken: Visit diagnoses modified, Medication long-term status modified, Order list changed, Diagnosis association updated, Clinical Note Signed, Charge Capture section accepted

## 2021-03-07 NOTE — Progress Notes (Signed)
Advanced Heart Failure Clinic Note   PCP: None Primary HF Cardiologist: Dr Gala Romney  HPI: Richard [name] is a 45 y.o. obese male with bilateral PEs and systolic HF diagnosed in 2017 with EF 10-15% Subsequent echo 4/18 EF 25-30%  He presents today for follow up. Haven't seen him for 8 months. Feels great! Back to working out. No SOB, orthopnea or PND> Has lost 30 pounds. Compliant with all medicines.    Bedside echo 12/21 EF 20-25%   ECHO 11/20/16 EF 25-30% Probable small laminated LV thrombus   ECHO 05/2016: EF 10-15%. RV dilated LV thrombus    FH: Negative for heart disease SH: No alcohol or drug use.   Review of systems complete and found to be negative unless listed in HPI.    SH:  Social History   Socioeconomic History   Marital status: Single    Spouse name: Not on file   Number of children: Not on file   Years of education: Not on file   Highest education level: Not on file  Occupational History   Not on file  Tobacco Use   Smoking status: Never   Smokeless tobacco: Never  Substance and Sexual Activity   Alcohol use: Yes    Comment: rare   Drug use: No   Sexual activity: Not on file  Other Topics Concern   Not on file  Social History Narrative   Not on file   Social Determinants of Health   Financial Resource Strain: Not on file  Food Insecurity: Not on file  Transportation Needs: Not on file  Physical Activity: Not on file  Stress: Not on file  Social Connections: Not on file  Intimate Partner Violence: Not on file    FH:  Family History  Problem Relation Age of Onset   Other Mother        Neg family history of clotting disorder or heart disease   Clotting disorder Neg Hx    Rheumatologic disease Neg Hx     Past Medical History:  Diagnosis Date   CHF (congestive heart failure) (HCC)     Current Outpatient Medications  Medication Sig Dispense Refill   apixaban (ELIQUIS) 5 MG TABS tablet Take 1 tablet (5 mg total) by mouth 2 (two) times  daily. Needs appt for further refills 60 tablet 2   carvedilol (COREG) 6.25 MG tablet TAKE 1 TABLET BY MOUTH TWICE DAILY WITH A MEAL 180 tablet 3   digoxin (LANOXIN) 0.125 MG tablet Take 1 tablet by mouth once daily 90 tablet 0   ENTRESTO 97-103 MG Take 1 tablet by mouth twice daily 60 tablet 6   spironolactone (ALDACTONE) 25 MG tablet Take 1 tablet by mouth once daily 90 tablet 0   No current facility-administered medications for this encounter.   Vitals:   03/07/21 1023  BP: 108/78  Pulse: 81  SpO2: 98%  Weight: (!) 137 kg (302 lb)     Wt Readings from Last 3 Encounters:  03/07/21 (!) 137 kg (302 lb)  07/07/20 (!) 147.9 kg (326 lb)  04/20/19 121.1 kg (267 lb)   ECG: SR 97 bpm (personally reviewed).  PHYSICAL EXAM: General:  Well appearing. No resp difficulty HEENT: normal Neck: supple. no JVD. Carotids 2+ bilat; no bruits. No lymphadenopathy or thryomegaly appreciated. Cor: PMI nondisplaced. Regular rate & rhythm. No rubs, gallops or murmurs. Lungs: clear Abdomen: obese soft, nontender, nondistended. No hepatosplenomegaly. No bruits or masses. Good bowel sounds. Extremities: no cyanosis, clubbing, rash, edema Neuro: alert &  orientedx3, cranial nerves grossly intact. moves all 4 extremities w/o difficulty. Affect pleasant  ASSESSMENT & PLAN:  1. Bilateral PE  - CTA 11/17  - On chronic Eliquis  - Based on Amplify-EXT data can consider dropping to 2.5 bid but given size. I have elected not to drop dose  2. Chronic Systolic Heart Failure - Echo 07/62/26 EF 10-15%. With LV thrombus. 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  - Echo 10/2016 with EF 25-30% laminated LV thrombus. Cannot exclude non-compaction.  - Bedside echo 12/21 EF ~20-25% - Doing great NYHA I volume status looks good  - Resume Entresto 97/103 mg BID.  - Continue spiro 25 mg daily. - Continue carvedilol 6.25 BID.  - Stop digoxin - Check echo if EF not improved will need cath t+/- cMRI to further  evaluate - Check labs  3. Large LV thrombus - Noted on Echo on 05/2016 - Stable on Eliquis 5 mg BID. Denies bleeding.  - Repeat echo   4. Snoring - much improved with weight loss   Arvilla Meres, MD  10:42 AM

## 2021-03-12 DIAGNOSIS — H5213 Myopia, bilateral: Secondary | ICD-10-CM | POA: Diagnosis not present

## 2021-03-21 ENCOUNTER — Telehealth (HOSPITAL_COMMUNITY): Payer: Self-pay | Admitting: Vascular Surgery

## 2021-03-21 ENCOUNTER — Other Ambulatory Visit: Payer: Self-pay

## 2021-03-21 ENCOUNTER — Ambulatory Visit (HOSPITAL_COMMUNITY)
Admission: RE | Admit: 2021-03-21 | Discharge: 2021-03-21 | Disposition: A | Discharge status: Home / Self Care | Payer: Medicaid Other | Source: Ambulatory Visit | Attending: Internal Medicine | Admitting: Internal Medicine

## 2021-03-21 DIAGNOSIS — I5022 Chronic systolic (congestive) heart failure: Secondary | ICD-10-CM | POA: Diagnosis not present

## 2021-03-21 DIAGNOSIS — I472 Ventricular tachycardia: Secondary | ICD-10-CM | POA: Insufficient documentation

## 2021-03-21 DIAGNOSIS — I2699 Other pulmonary embolism without acute cor pulmonale: Secondary | ICD-10-CM | POA: Insufficient documentation

## 2021-03-21 DIAGNOSIS — I5040 Unspecified combined systolic (congestive) and diastolic (congestive) heart failure: Secondary | ICD-10-CM | POA: Insufficient documentation

## 2021-03-21 LAB — ECHOCARDIOGRAM COMPLETE
Area-P 1/2: 8.11 cm2
Calc EF: 16.6 %
S' Lateral: 6.7 cm
Single Plane A2C EF: 18.3 %
Single Plane A4C EF: 14.3 %

## 2021-03-21 NOTE — Telephone Encounter (Signed)
Returned pt call, pt lvm on scheduling line, pt did not answer and does not have VM set up

## 2021-03-21 NOTE — Progress Notes (Signed)
  Echocardiogram 2D Echocardiogram has been performed.  Richard [name] 03/21/2021, 3:49 PM

## 2021-04-10 ENCOUNTER — Other Ambulatory Visit (HOSPITAL_COMMUNITY): Payer: Self-pay | Admitting: Internal Medicine

## 2021-06-13 ENCOUNTER — Other Ambulatory Visit (HOSPITAL_COMMUNITY): Payer: Self-pay | Admitting: Internal Medicine

## 2021-06-24 ENCOUNTER — Encounter (HOSPITAL_COMMUNITY): Payer: Self-pay | Admitting: *Deleted

## 2021-06-24 ENCOUNTER — Inpatient Hospital Stay (HOSPITAL_COMMUNITY)
Admission: EM | Admit: 2021-06-24 | Discharge: 2021-06-28 | DRG: 286 | Disposition: A | Discharge status: Home / Self Care | Payer: Medicaid Other | Attending: Internal Medicine | Admitting: Internal Medicine

## 2021-06-24 ENCOUNTER — Emergency Department (HOSPITAL_COMMUNITY): Payer: Medicaid Other

## 2021-06-24 ENCOUNTER — Other Ambulatory Visit: Payer: Self-pay

## 2021-06-24 DIAGNOSIS — Z7901 Long term (current) use of anticoagulants: Secondary | ICD-10-CM

## 2021-06-24 DIAGNOSIS — Z79899 Other long term (current) drug therapy: Secondary | ICD-10-CM

## 2021-06-24 DIAGNOSIS — I110 Hypertensive heart disease with heart failure: Principal | ICD-10-CM | POA: Diagnosis present

## 2021-06-24 DIAGNOSIS — R509 Fever, unspecified: Secondary | ICD-10-CM | POA: Diagnosis not present

## 2021-06-24 DIAGNOSIS — I509 Heart failure, unspecified: Secondary | ICD-10-CM

## 2021-06-24 DIAGNOSIS — Z86711 Personal history of pulmonary embolism: Secondary | ICD-10-CM | POA: Diagnosis not present

## 2021-06-24 DIAGNOSIS — I5043 Acute on chronic combined systolic (congestive) and diastolic (congestive) heart failure: Secondary | ICD-10-CM | POA: Diagnosis not present

## 2021-06-24 DIAGNOSIS — E669 Obesity, unspecified: Secondary | ICD-10-CM | POA: Diagnosis present

## 2021-06-24 DIAGNOSIS — I5082 Biventricular heart failure: Secondary | ICD-10-CM | POA: Diagnosis not present

## 2021-06-24 DIAGNOSIS — I493 Ventricular premature depolarization: Secondary | ICD-10-CM | POA: Diagnosis not present

## 2021-06-24 DIAGNOSIS — I272 Pulmonary hypertension, unspecified: Secondary | ICD-10-CM | POA: Diagnosis not present

## 2021-06-24 DIAGNOSIS — R112 Nausea with vomiting, unspecified: Secondary | ICD-10-CM

## 2021-06-24 DIAGNOSIS — I5023 Acute on chronic systolic (congestive) heart failure: Secondary | ICD-10-CM | POA: Diagnosis not present

## 2021-06-24 DIAGNOSIS — K625 Hemorrhage of anus and rectum: Secondary | ICD-10-CM | POA: Diagnosis not present

## 2021-06-24 DIAGNOSIS — I517 Cardiomegaly: Secondary | ICD-10-CM | POA: Diagnosis not present

## 2021-06-24 DIAGNOSIS — I472 Ventricular tachycardia, unspecified: Secondary | ICD-10-CM | POA: Diagnosis not present

## 2021-06-24 DIAGNOSIS — I428 Other cardiomyopathies: Secondary | ICD-10-CM | POA: Diagnosis not present

## 2021-06-24 DIAGNOSIS — R059 Cough, unspecified: Secondary | ICD-10-CM | POA: Diagnosis not present

## 2021-06-24 DIAGNOSIS — R0602 Shortness of breath: Secondary | ICD-10-CM | POA: Diagnosis not present

## 2021-06-24 DIAGNOSIS — Z20822 Contact with and (suspected) exposure to covid-19: Secondary | ICD-10-CM | POA: Diagnosis not present

## 2021-06-24 LAB — COMPREHENSIVE METABOLIC PANEL
ALT: 18 U/L (ref 0–44)
AST: 24 U/L (ref 15–41)
Albumin: 3.2 g/dL — ABNORMAL LOW (ref 3.5–5.0)
Alkaline Phosphatase: 43 U/L (ref 38–126)
Anion gap: 10 (ref 5–15)
BUN: 13 mg/dL (ref 6–20)
CO2: 22 mmol/L (ref 22–32)
Calcium: 8.7 mg/dL — ABNORMAL LOW (ref 8.9–10.3)
Chloride: 103 mmol/L (ref 98–111)
Creatinine, Ser: 1.25 mg/dL — ABNORMAL HIGH (ref 0.61–1.24)
GFR, Estimated: >60 mL/min (ref >60)
Glucose, Bld: 137 mg/dL — ABNORMAL HIGH (ref 70–99)
Potassium: 4.6 mmol/L (ref 3.5–5.1)
Sodium: 135 mmol/L (ref 135–145)
Total Bilirubin: 2.1 mg/dL — ABNORMAL HIGH (ref 0.3–1.2)
Total Protein: 6.6 g/dL (ref 6.5–8.1)

## 2021-06-24 LAB — LIPASE, BLOOD: Lipase: 21 U/L (ref 11–51)

## 2021-06-24 LAB — CBC
HCT: 45.5 % (ref 39.0–52.0)
Hemoglobin: 15.1 g/dL (ref 13.0–17.0)
MCH: 30.6 pg (ref 26.0–34.0)
MCHC: 33.2 g/dL (ref 30.0–36.0)
MCV: 92.1 fL (ref 80.0–100.0)
Platelets: 291 10*3/uL (ref 150–400)
RBC: 4.94 MIL/uL (ref 4.22–5.81)
RDW: 15 % (ref 11.5–15.5)
WBC: 4.1 10*3/uL (ref 4.0–10.5)
nRBC: 0 % (ref 0.0–0.2)

## 2021-06-24 LAB — TROPONIN I (HIGH SENSITIVITY)
Troponin I (High Sensitivity): 15 ng/L (ref <18)
Troponin I (High Sensitivity): 15 ng/L (ref <18)
Troponin I (High Sensitivity): 14 ng/L (ref <18)

## 2021-06-24 LAB — RESP PANEL BY RT-PCR (FLU A&B, COVID) ARPGX2
Influenza A by PCR: NEGATIVE
Influenza B by PCR: NEGATIVE
SARS Coronavirus 2 by RT PCR: NEGATIVE

## 2021-06-24 LAB — D-DIMER, QUANTITATIVE: D-Dimer, Quant: 0.45 ug/mL-FEU (ref 0.00–0.50)

## 2021-06-24 LAB — BRAIN NATRIURETIC PEPTIDE: B Natriuretic Peptide: 3319.5 pg/mL — ABNORMAL HIGH (ref 0.0–100.0)

## 2021-06-24 MED ORDER — APIXABAN 5 MG PO TABS
5.0000 mg | ORAL_TABLET | Freq: Two times a day (BID) | ORAL | Status: DC
  Administered 2021-06-24 – 2021-06-25 (×2): 5 mg via ORAL
  Filled 2021-06-24 (×2): qty 1

## 2021-06-24 MED ORDER — ACETAMINOPHEN 325 MG PO TABS: 650.0000 mg | ORAL_TABLET | ORAL | Status: DC | PRN

## 2021-06-24 MED ORDER — SODIUM CHLORIDE 0.9% FLUSH: 3.0000 mL | INTRAVENOUS | Status: DC | PRN

## 2021-06-24 MED ORDER — SPIRONOLACTONE 25 MG PO TABS
25.0000 mg | ORAL_TABLET | Freq: Every day | ORAL | Status: DC
  Administered 2021-06-24 – 2021-06-28 (×5): 25 mg via ORAL
  Filled 2021-06-24 (×6): qty 1

## 2021-06-24 MED ORDER — FUROSEMIDE 10 MG/ML IJ SOLN
20.0000 mg | Freq: Once | INTRAMUSCULAR | Status: AC
  Administered 2021-06-24: 21:00:00 20 mg via INTRAVENOUS
  Filled 2021-06-24: qty 2

## 2021-06-24 MED ORDER — SODIUM CHLORIDE 0.9% FLUSH
3.0000 mL | Freq: Two times a day (BID) | INTRAVENOUS | Status: DC
  Administered 2021-06-25: 10:00:00 3 mL via INTRAVENOUS

## 2021-06-24 MED ORDER — SODIUM CHLORIDE 0.9 % IV SOLN: 250.0000 mL | INTRAVENOUS | Status: DC | PRN

## 2021-06-24 MED ORDER — CARVEDILOL 6.25 MG PO TABS
6.2500 mg | ORAL_TABLET | Freq: Two times a day (BID) | ORAL | Status: DC
  Administered 2021-06-25 – 2021-06-28 (×7): 6.25 mg via ORAL
  Filled 2021-06-24: qty 1
  Filled 2021-06-24: qty 2
  Filled 2021-06-24: qty 1
  Filled 2021-06-24: qty 2
  Filled 2021-06-24 (×3): qty 1

## 2021-06-24 MED ORDER — TORSEMIDE 20 MG PO TABS
20.0000 mg | ORAL_TABLET | Freq: Every day | ORAL | Status: DC
  Filled 2021-06-24: qty 1

## 2021-06-24 MED ORDER — ONDANSETRON HCL 4 MG/2ML IJ SOLN: 4.0000 mg | Freq: Four times a day (QID) | INTRAMUSCULAR | Status: DC | PRN

## 2021-06-24 MED ORDER — SACUBITRIL-VALSARTAN 97-103 MG PO TABS
1.0000 | ORAL_TABLET | Freq: Two times a day (BID) | ORAL | Status: DC
  Administered 2021-06-24 – 2021-06-28 (×7): 1 via ORAL
  Filled 2021-06-24 (×10): qty 1

## 2021-06-24 NOTE — ED Triage Notes (Signed)
The pt is c/o sob for 2 weeks with feet  and leg  swelling today he reports that he saw bright rfed blood in his stools

## 2021-06-24 NOTE — ED Notes (Signed)
MD Charm Barges made aware of pt BP

## 2021-06-24 NOTE — ED Notes (Signed)
Pt ambulatory to restroom

## 2021-06-24 NOTE — ED Provider Notes (Signed)
[facility] EMERGENCY DEPARTMENT Provider Note   CSN: [id number] Arrival date & time: 06/24/21  1712     History Chief Complaint  Patient presents with   Shortness of Breath    Richard [name] is a 45 y.o. male.  He has a history of congestive heart failure and SVT.  Follows with Dr. Haroldine Laws of the heart failure clinic.  About a month ago he got sick with some sort of GI illness and he has not really recovered from.  Complaining of nausea vomiting, not sure he is holding down his medications.  Abdominal crampy pain at times.  Feels more short of breath and has noticed more swelling of his feet and ankles.  Today having a normal bowel movement he noticed some blood on the stool.  He has not really noticed any other bleeding.  He does not check his weights so he does not know if he has been putting on extra fluids.  Has not been working out as much due to this last illness that he had.  The history is provided by the patient.  Shortness of Breath Severity:  Moderate Onset quality:  Gradual Duration:  1 month Timing:  Intermittent Progression:  Unchanged Chronicity:  Recurrent Context: activity   Relieved by:  Nothing Worsened by:  Activity Ineffective treatments:  Rest Associated symptoms: abdominal pain, cough and vomiting   Associated symptoms: no chest pain, no diaphoresis, no fever, no headaches, no hemoptysis, no neck pain, no rash, no sore throat and no sputum production   Abdominal pain:    Location:  Generalized   Quality: cramping     Duration:  1 day   Timing:  Rare   Progression:  Resolved   Chronicity:  New Vomiting:    Quality:  Stomach contents   Severity:  Moderate   Duration:  1 month   Timing:  Sporadic   Progression:  Unchanged     Past Medical History:  Diagnosis Date   CHF (congestive heart failure) (HCC)     Patient Active Problem List   Diagnosis Date Noted   LV (left ventricular) mural thrombus without MI ([place]) 08/22/2016    Chronic systolic CHF (congestive heart failure) ([place]) 08/22/2016   Hemoptysis 06/18/2016   Cardiomyopathy ([place]) 06/18/2016   Elevated troponin 06/18/2016   Abnormal liver function tests 06/18/2016   NSVT (nonsustained ventricular tachycardia) 06/18/2016   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 No pertinent surgical history.     Family History  Problem Relation Age of Onset   Other Mother        Neg family history of clotting disorder or heart disease   Clotting disorder Neg Hx    Rheumatologic disease Neg Hx     Social History   Tobacco Use   Smoking status: Never   Smokeless tobacco: Never  Substance Use Topics   Alcohol use: Yes    Comment: rare   Drug use: No    Home Medications Prior to Admission medications   Medication Sig Start Date End Date Taking? Authorizing Provider  carvedilol (COREG) 6.25 MG tablet TAKE 1 TABLET BY MOUTH TWICE DAILY WITH A MEAL 12/05/20   Bensimhon, Shaune Pascal, MD  ELIQUIS 5 MG TABS tablet TAKE 1 TABLET BY MOUTH TWICE DAILY . APPOINTMENT REQUIRED FOR FUTURE REFILLS 06/14/21   Bensimhon, Shaune Pascal, MD  ENTRESTO 97-103 MG Take 1 tablet by mouth twice daily 12/05/20   Bensimhon, Quillian Quince  R, MD  spironolactone (ALDACTONE) 25 MG tablet Take 1 tablet by mouth once daily 04/11/21   Bensimhon, Bevelyn Buckles, MD    Allergies    Patient has no known allergies.  Review of Systems   Review of Systems  Constitutional:  Negative for diaphoresis and fever.  HENT:  Negative for sore throat.   Eyes:  Negative for visual disturbance.  Respiratory:  Positive for cough and shortness of breath. Negative for hemoptysis and sputum production.   Cardiovascular:  Positive for leg swelling. Negative for chest pain.  Gastrointestinal:  Positive for abdominal pain and vomiting.  Genitourinary:  Negative for dysuria.  Musculoskeletal:  Negative for neck pain.  Skin:  Negative for rash.  Neurological:  Negative  for headaches.   Physical Exam Updated Vital Signs BP (!) 125/102   Pulse 90   Temp 98.4 F (36.9 C)   Resp (!) 22   Ht 6\' 7"  (2.007 m)   Wt (!) 137 kg   SpO2 99%   BMI 34.03 kg/m   Physical Exam Vitals and nursing note reviewed.  Constitutional:      General: He is not in acute distress.    Appearance: He is well-developed.  HENT:     Head: Normocephalic and atraumatic.  Eyes:     Conjunctiva/sclera: Conjunctivae normal.  Cardiovascular:     Rate and Rhythm: Normal rate and regular rhythm.     Heart sounds: No murmur heard. Pulmonary:     Effort: Pulmonary effort is normal. No respiratory distress.     Breath sounds: Normal breath sounds.  Abdominal:     Palpations: Abdomen is soft.     Tenderness: There is no abdominal tenderness. There is no guarding or rebound.  Musculoskeletal:        General: No swelling.     Cervical back: Neck supple.     Right lower leg: No tenderness. Edema present.     Left lower leg: No tenderness. Edema present.     Comments: Trace edema bilaterally  Skin:    General: Skin is warm and dry.     Capillary Refill: Capillary refill takes less than 2 seconds.  Neurological:     Mental Status: He is alert.  Psychiatric:        Mood and Affect: Mood normal.    ED Results / Procedures / Treatments   Labs (all labs ordered are listed, but only abnormal results are displayed) Labs Reviewed  COMPREHENSIVE METABOLIC PANEL - Abnormal; Notable for the following components:      Result Value   Glucose, Bld 137 (*)    Creatinine, Ser 1.25 (*)    Calcium 8.7 (*)    Albumin 3.2 (*)    Total Bilirubin 2.1 (*)    All other components within normal limits  BRAIN NATRIURETIC PEPTIDE - Abnormal; Notable for the following components:   B Natriuretic Peptide 3,319.5 (*)    All other components within normal limits  BASIC METABOLIC PANEL - Abnormal; Notable for the following components:   Calcium 8.6 (*)    All other components within normal limits   RESP PANEL BY RT-PCR (FLU A&B, COVID) ARPGX2  CBC  LIPASE, BLOOD  D-DIMER, QUANTITATIVE  HIV ANTIBODY (ROUTINE TESTING W REFLEX)  COOXEMETRY PANEL  HEMOGLOBIN A1C  TROPONIN I (HIGH SENSITIVITY)  TROPONIN I (HIGH SENSITIVITY)  TROPONIN I (HIGH SENSITIVITY)    EKG EKG Interpretation  Date/Time:  Sunday June 24 2021 17:49:55 EST Ventricular Rate:  89 PR Interval:  208 QRS Duration: 102 QT Interval:  384 QTC Calculation: 467 R Axis:   120 Text Interpretation: Normal sinus rhythm Possible Left atrial enlargement Right axis deviation Low voltage QRS Cannot rule out Anterior infarct , age undetermined Abnormal ECG improved ischemic changes from prior 12/21 Confirmed by Aletta Edouard [phone]) on 06/24/2021 6:39:41 PM  Radiology DG Chest 2 View  Result Date: 06/24/2021 CLINICAL DATA:  Cough, congestion, fever and shortness of breath for 2 weeks. EXAM: CHEST - 2 VIEW COMPARISON:  04/20/2019. FINDINGS: Cardiac silhouette is mildly enlarged. No mediastinal or hilar masses or evidence of adenopathy. Mild interstitial thickening most evident in the peripheral lung bases. Lungs otherwise clear. No pleural effusion or pneumothorax. Skeletal structures are intact. IMPRESSION: 1. Cardiomegaly with mild interstitial thickening. Mild congestive heart failure suspected. No evidence of pneumonia. Electronically Signed   By: Lajean Manes M.D.   On: 06/24/2021 18:29    Procedures Procedures   Medications Ordered in ED Medications  carvedilol (COREG) tablet 6.25 mg (6.25 mg Oral Given 06/25/21 0749)  sacubitril-valsartan (ENTRESTO) 97-103 mg per tablet (1 tablet Oral Given 06/25/21 0941)  spironolactone (ALDACTONE) tablet 25 mg (25 mg Oral Given 06/25/21 0940)  apixaban (ELIQUIS) tablet 5 mg (5 mg Oral Given 06/25/21 0940)  sodium chloride flush (NS) 0.9 % injection 3 mL (3 mLs Intravenous Given 06/25/21 0945)  sodium chloride flush (NS) 0.9 % injection 3 mL (has no administration in time  range)  0.9 %  sodium chloride infusion (has no administration in time range)  acetaminophen (TYLENOL) tablet 650 mg (has no administration in time range)  ondansetron (ZOFRAN) injection 4 mg (has no administration in time range)  furosemide (LASIX) injection 20 mg (20 mg Intravenous Given 06/24/21 2041)  furosemide (LASIX) injection 20 mg (20 mg Intravenous Given 06/25/21 [id number])    ED Course  I have reviewed the triage vital signs and the nursing notes.  Pertinent labs & imaging results that were available during my care of the patient were reviewed by me and considered in my medical decision making (see chart for details).  Clinical Course as of 06/25/21 1041  Sun Jun 24, 2021  2009 Discussed with cardiology fellow who will come down and see the patient for recommendations. [MB]  2136 Cardiology fellow Dr. Marcelle Smiling evaluated the patient and feels he would be better served by being admitted to the hospital.  Cardiology is accepting to their service. [MB]    Clinical Course User Index [MB] Hayden Rasmussen, MD   MDM Rules/Calculators/A&P                          Richard [name] was evaluated in Emergency Department on 06/24/2021 for the symptoms described in the history of present illness. He was evaluated in the context of the global COVID-19 pandemic, which necessitated consideration that the patient might be at risk for infection with the SARS-CoV-2 virus that causes COVID-19. Institutional protocols and algorithms that pertain to the evaluation of patients at risk for COVID-19 are in a state of rapid change based on information released by regulatory bodies including the CDC and federal and state organizations. These policies and algorithms were followed during the patient's care in the ED.  This patient complains of shortness of breath leg edema blood in stool nausea vomiting generalized fatigue and weakness; this involves an extensive number of treatment Options and is a complaint  that carries with it a high risk of complications and Morbidity. The differential  includes CHF, anemia, arrhythmia, GI bleed, metabolic derangement  I ordered, reviewed and interpreted labs, which included CBC with normal white count normal hemoglobin, chemistries with unremarkable other than mildly elevated creatinine and glucose, BNP elevated, troponins flat, COVID and flu negative, D-dimer negative I ordered IV Lasix for patient's fluid overload I ordered imaging studies which included chest x-ray and I independently    visualized and interpreted imaging which showed cardiomegaly mild fluid overload Additional history obtained from patient's wife Previous records obtained and reviewed in epic including prior cardiology notes in August where patient was doing quite well with his congestive heart failure I consulted cardiology Dr. Marcelle Smiling And discussed lab and imaging findings  Critical Interventions: None  After the interventions stated above, I reevaluated the patient and found patient to be reasonably asymptomatic at rest.  Cardiology is recommending admission to their service and I think that is reasonable to continue to stabilize him.  Patient agreeable to plan.   Final Clinical Impression(s) / ED Diagnoses Final diagnoses:  Acute on chronic congestive heart failure, unspecified heart failure type (HCC)  Nausea and vomiting, unspecified vomiting type  Rectal bleed    Rx / DC Orders ED Discharge Orders     None        Hayden Rasmussen, MD 06/25/21 1045

## 2021-06-24 NOTE — ED Notes (Signed)
MD Narcisse notified of BP

## 2021-06-24 NOTE — ED Triage Notes (Signed)
The pt takes eliquist   he has a hx of blood clots somewhere in his body

## 2021-06-24 NOTE — ED Provider Notes (Signed)
Emergency Medicine Provider Triage Evaluation Note  Richard [name] , [name] 45 y.o. male  was evaluated in triage.  Pt complains of shortness of breath, lower extremity swelling.  Has history of heart failure, denies taking antipyretics.  Has bilateral lower extremity swelling over the last 2 weeks.  No PND or orthopnea.  States his heart feels "off."  He cannot tell me exactly how.  States he has had intermittent vomiting over the last month as well.  No abdominal pain.  No diarrhea, urinary complaints, fever.  States he supposed be taking Eliquis due to history of recurrent PE however he has either thrown up the doses he has taken or he has missed these doses.  Review of Systems  Positive: Sob, le edema Negative: Fever, abd pain, back pain  Physical Exam  BP (!) 125/102   Pulse 90   Temp 98.4 F (36.9 C)   Resp (!) 22   Ht 6\' 7"  (2.007 m)   Wt (!) 137 kg   SpO2 99%   BMI 34.03 kg/m  Gen:   Awake, no distress   Resp:  Normal effort  MSK:   Moves extremities without difficulty, pitting edema to BL extremities. Non tender LE, calves Other:    Medical Decision Making  Medically screening exam initiated at 5:52 PM.  Appropriate orders placed.  Richard [name] was informed that the remainder of the evaluation will be completed by another provider, this initial triage assessment does not replace that evaluation, and the importance of remaining in the ED until their evaluation is complete.  Lower extremity pitting edema, shortness of breath   Richard [name] A, PA-C 06/24/21 1754    06/26/21, MD 06/24/21 2107

## 2021-06-24 NOTE — ED Notes (Signed)
Pt is eating and drinking fluids.

## 2021-06-24 NOTE — H&P (Signed)
Cardiology Admission History and Physical:   Patient ID: Richard [name] MRN: [id number]; DOB: 1975-11-01   Admission date: 06/24/2021  PCP:  Pcp, No   [facility] Providers Cardiologist:  None  Advanced Heart Failure:  Glori Bickers, MD       Chief Complaint:  shortness of breath  Patient Profile:   Richard [name] is a 45 y.o. male with history of bilateral PEs, systolic heart failure, obesity who is being seen 06/24/2021 for the evaluation of worsening shortness of breath and volume overload.  History of Present Illness:   Richard [name] is a 45 year old male with a history of bilateral PEs, systolic heart failure, obesity who initially had a viral illness roughly a month ago.  Noted that he had fevers, body aches, and GI symptoms.  And subsequently continued to decline over the following weeks.  Noted that he had progressive dyspnea and dyspnea on exertion, orthopnea, and PND.  Notes that he is also had worsening lower extremity edema.  He follows with Pierre Bali in clinic, and had a visit scheduled with him in a few weeks but ultimately presented to the emergency department because of worsening symptoms.  Notes that he is also been having some chest pressure and tightness that is worse when he lies flat and gets better when he sits up.  Arrival to the emergency department, his initial lab results included a opponent and D-dimer that were within normal limits.  Repeat troponin with no change.  Creatinine was up to 1.25 from a prior 0.8.  His BNP was 3319.  No other initial labs were remarkable.  His initial vital signs included mild tachypnea with a respiratory rate of 25 and mild hypertension.  He was given 20 mg lasix prior to my evaluation.      Past Medical History:  Diagnosis Date   CHF (congestive heart failure) ([place])     History reviewed. No pertinent surgical history.   Medications Prior to Admission: Prior to Admission medications   Medication Sig Start Date  End Date Taking? Authorizing Provider  carvedilol (COREG) 6.25 MG tablet TAKE 1 TABLET BY MOUTH TWICE DAILY WITH A MEAL 12/05/20   Bensimhon, Shaune Pascal, MD  ELIQUIS 5 MG TABS tablet TAKE 1 TABLET BY MOUTH TWICE DAILY . APPOINTMENT REQUIRED FOR FUTURE REFILLS 06/14/21   Bensimhon, Shaune Pascal, MD  ENTRESTO 97-103 MG Take 1 tablet by mouth twice daily 12/05/20   Bensimhon, Shaune Pascal, MD  spironolactone (ALDACTONE) 25 MG tablet Take 1 tablet by mouth once daily 04/11/21   Bensimhon, Shaune Pascal, MD     Allergies:   No Known Allergies  Social History:   Social History   Socioeconomic History   Marital status: Single    Spouse name: Not on file   Number of children: Not on file   Years of education: Not on file   Highest education level: Not on file  Occupational History   Not on file  Tobacco Use   Smoking status: Never   Smokeless tobacco: Never  Substance and Sexual Activity   Alcohol use: Yes    Comment: rare   Drug use: No   Sexual activity: Not on file  Other Topics Concern   Not on file  Social History Narrative   Not on file   Social Determinants of Health   Financial Resource Strain: Not on file  Food Insecurity: Not on file  Transportation Needs: Not on file  Physical Activity: Not on file  Stress: Not on file  Social Connections: Not on file  Intimate Partner Violence: Not on file    Family History:   The patient's family history includes Other in his mother. There is no history of Clotting disorder or Rheumatologic disease.    ROS:  Please see the history of present illness.  All other ROS reviewed and negative.     Physical Exam/Data:   Vitals:   06/24/21 2015 06/24/21 2045 06/24/21 2100 06/24/21 2130  BP: (!) 118/96 (!) 137/113 (!) 128/104 (!) 135/114  Pulse: 88 88 91 88  Resp: (!) 21 13 (!) 22 (!) 25  Temp:      TempSrc:      SpO2: 100% 97% 99% 100%  Weight:      Height:        Intake/Output Summary (Last 24 hours) at 06/24/2021 2201 Last data filed at  06/24/2021 2129 Gross per 24 hour  Intake --  Output 400 ml  Net -400 ml   Last 3 Weights 06/24/2021 03/07/2021 07/07/2020  Weight (lbs) 302 lb 0.5 oz 302 lb 326 lb  Weight (kg) 137 kg 136.986 kg 147.873 kg     Body mass index is 34.03 kg/m.  General:  Well nourished, well developed, in no acute distress HEENT: normal Neck: elevated JVD to mandible Vascular: No carotid bruits; Distal pulses 2+ bilaterally   Cardiac:  normal S1, S2; s3 present; rrr Lungs:  faint crackles at the bases Abd: soft, nontender, no hepatomegaly  Ext: 1+ pitting edema edema Musculoskeletal:  No deformities, BUE and BLE strength normal and equal Skin: warm and dry  Neuro:  CNs 2-12 intact, no focal abnormalities noted Psych:  Normal affect    EKG:  The ECG that was done  was personally reviewed and demonstrates no ST or T wave changes   Relevant CV Studies: none  Laboratory Data:  High Sensitivity Troponin:   Recent Labs  Lab 06/24/21 1746 06/24/21 1752 06/24/21 1943  TROPONINIHS 15 15 14       Chemistry Recent Labs  Lab 06/24/21 1752  NA 135  K 4.6  CL 103  CO2 22  GLUCOSE 137*  BUN 13  CREATININE 1.25*  CALCIUM 8.7*  GFRNONAA >60  ANIONGAP 10    Recent Labs  Lab 06/24/21 1752  PROT 6.6  ALBUMIN 3.2*  AST 24  ALT 18  ALKPHOS 43  BILITOT 2.1*   Lipids No results for input(s): CHOL, TRIG, HDL, LABVLDL, LDLCALC, CHOLHDL in the last 168 hours. Hematology Recent Labs  Lab 06/24/21 1746  WBC 4.1  RBC 4.94  HGB 15.1  HCT 45.5  MCV 92.1  MCH 30.6  MCHC 33.2  RDW 15.0  PLT 291   Thyroid No results for input(s): TSH, FREET4 in the last 168 hours. BNP Recent Labs  Lab 06/24/21 1752  BNP 3,319.5*    DDimer  Recent Labs  Lab 06/24/21 1752  DDIMER 0.45     Radiology/Studies:  DG Chest 2 View  Result Date: 06/24/2021 CLINICAL DATA:  Cough, congestion, fever and shortness of breath for 2 weeks. EXAM: CHEST - 2 VIEW COMPARISON:  04/20/2019. FINDINGS: Cardiac  silhouette is mildly enlarged. No mediastinal or hilar masses or evidence of adenopathy. Mild interstitial thickening most evident in the peripheral lung bases. Lungs otherwise clear. No pleural effusion or pneumothorax. Skeletal structures are intact. IMPRESSION: 1. Cardiomegaly with mild interstitial thickening. Mild congestive heart failure suspected. No evidence of pneumonia. Electronically Signed   By: 04/22/2019 M.D.   On: 06/24/2021 18:29  Assessment and Plan:   Acute on chronic systolic heart failure exacerbation.  Seems that this is a subacute process that has been progressive since some sort of viral illness roughly a month ago.  He is volume overloaded at this time and quite symptomatic with NYHA class III heart failure.  It is possible that he could have been titrated at home, however given the progression of his symptoms elected to admit and give IV diuresis overnight and titrate his diuretics in the hospital.  He was given IV 20 mg Lasix in the emergency department and will prescribe 20 mg p.o. torsemide for tomorrow.  We will order repeat echo for tomorrow.  A.m. labs are ordered.  We will continue his home Entresto and carvedilol.  May need to titrate carvedilol given elevated blood pressures on arrival.  Continue spironolactone. History of PE.  Dimer was ordered by the emergency department which was within normal limits.  We will continue apixaban 5 mg twice daily.   Risk Assessment/Risk Scores:       New York Heart Association (NYHA) Functional Class NYHA Class III     Severity of Illness: The appropriate patient status for this patient is INPATIENT. Inpatient status is judged to be reasonable and necessary in order to provide the required intensity of service to ensure the patient's safety. The patient's presenting symptoms, physical exam findings, and initial radiographic and laboratory data in the context of their chronic comorbidities is felt to place them at high risk  for further clinical deterioration. Furthermore, it is not anticipated that the patient will be medically stable for discharge from the hospital within 2 midnights of admission.   * I certify that at the point of admission it is my clinical judgment that the patient will require inpatient hospital care spanning beyond 2 midnights from the point of admission due to high intensity of service, high risk for further deterioration and high frequency of surveillance required.*   For questions or updates, please contact [place] Please consult [email or web address] for contact info under     Signed, Doyne Keel, MD  06/24/2021 10:01 PM

## 2021-06-25 ENCOUNTER — Inpatient Hospital Stay (HOSPITAL_COMMUNITY): Payer: Medicaid Other

## 2021-06-25 ENCOUNTER — Inpatient Hospital Stay: Payer: Self-pay

## 2021-06-25 DIAGNOSIS — I509 Heart failure, unspecified: Secondary | ICD-10-CM | POA: Diagnosis not present

## 2021-06-25 DIAGNOSIS — I5023 Acute on chronic systolic (congestive) heart failure: Secondary | ICD-10-CM

## 2021-06-25 DIAGNOSIS — I472 Ventricular tachycardia, unspecified: Secondary | ICD-10-CM | POA: Diagnosis not present

## 2021-06-25 LAB — ECHOCARDIOGRAM COMPLETE
Area-P 1/2: 5.54 cm2
Calc EF: 11.5 %
Height: 79 in
MV M vel: 3.72 m/s
MV Peak grad: 55.4 mmHg
Radius: 0.6 cm
S' Lateral: 5.9 cm
Single Plane A2C EF: 4.8 %
Single Plane A4C EF: 16.2 %
Weight: 4832.48 oz

## 2021-06-25 LAB — BASIC METABOLIC PANEL
Anion gap: 9 (ref 5–15)
BUN: 15 mg/dL (ref 6–20)
CO2: 23 mmol/L (ref 22–32)
Calcium: 8.6 mg/dL — ABNORMAL LOW (ref 8.9–10.3)
Chloride: 104 mmol/L (ref 98–111)
Creatinine, Ser: 1.22 mg/dL (ref 0.61–1.24)
GFR, Estimated: >60 mL/min (ref >60)
Glucose, Bld: 99 mg/dL (ref 70–99)
Potassium: 4.4 mmol/L (ref 3.5–5.1)
Sodium: 136 mmol/L (ref 135–145)

## 2021-06-25 LAB — APTT: aPTT: 35 seconds (ref 24–36)

## 2021-06-25 LAB — COOXEMETRY PANEL
Carboxyhemoglobin: 1.2 % (ref 0.5–1.5)
Methemoglobin: 0.8 % (ref 0.0–1.5)
O2 Saturation: 60.8 %
Total hemoglobin: 14.3 g/dL (ref 12.0–16.0)

## 2021-06-25 LAB — HIV ANTIBODY (ROUTINE TESTING W REFLEX): HIV Screen 4th Generation wRfx: NONREACTIVE

## 2021-06-25 LAB — HEPARIN LEVEL (UNFRACTIONATED): Heparin Unfractionated: >1.1 IU/mL — ABNORMAL HIGH (ref 0.30–0.70)

## 2021-06-25 MED ORDER — PERFLUTREN LIPID MICROSPHERE
1.0000 mL | INTRAVENOUS | Status: AC | PRN
  Administered 2021-06-25: 12:00:00 2 mL via INTRAVENOUS
  Filled 2021-06-25: qty 10

## 2021-06-25 MED ORDER — SODIUM CHLORIDE 0.9 % IV SOLN: INTRAVENOUS | Status: DC

## 2021-06-25 MED ORDER — SODIUM CHLORIDE 0.9 % IV SOLN: 250.0000 mL | INTRAVENOUS | Status: DC | PRN

## 2021-06-25 MED ORDER — HEPARIN (PORCINE) 25000 UT/250ML-% IV SOLN
2000.0000 [IU]/h | INTRAVENOUS | Status: DC
  Administered 2021-06-25: 22:00:00 1700 [IU]/h via INTRAVENOUS
  Administered 2021-06-26: 2000 [IU]/h via INTRAVENOUS
  Filled 2021-06-25 (×3): qty 250

## 2021-06-25 MED ORDER — CHLORHEXIDINE GLUCONATE CLOTH 2 % EX PADS
6.0000 | MEDICATED_PAD | Freq: Every day | CUTANEOUS | Status: DC
  Administered 2021-06-26 – 2021-06-28 (×3): 6 via TOPICAL

## 2021-06-25 MED ORDER — SODIUM CHLORIDE 0.9% FLUSH
10.0000 mL | Freq: Two times a day (BID) | INTRAVENOUS | Status: DC
  Administered 2021-06-25: 15:00:00 10 mL
  Administered 2021-06-26 – 2021-06-27 (×2): 20 mL

## 2021-06-25 MED ORDER — SODIUM CHLORIDE 0.9% FLUSH: 10.0000 mL | INTRAVENOUS | Status: DC | PRN

## 2021-06-25 MED ORDER — SODIUM CHLORIDE 0.9% FLUSH
3.0000 mL | Freq: Two times a day (BID) | INTRAVENOUS | Status: DC
  Administered 2021-06-26 – 2021-06-27 (×2): 3 mL via INTRAVENOUS

## 2021-06-25 MED ORDER — SODIUM CHLORIDE 0.9% FLUSH: 3.0000 mL | INTRAVENOUS | Status: DC | PRN

## 2021-06-25 MED ORDER — ASPIRIN 81 MG PO CHEW
81.0000 mg | CHEWABLE_TABLET | ORAL | Status: AC
  Administered 2021-06-26: 81 mg via ORAL
  Filled 2021-06-25: qty 1

## 2021-06-25 MED ORDER — FUROSEMIDE 10 MG/ML IJ SOLN
20.0000 mg | Freq: Once | INTRAMUSCULAR | Status: AC
  Administered 2021-06-25: 10:00:00 20 mg via INTRAVENOUS
  Filled 2021-06-25: qty 2

## 2021-06-25 NOTE — Progress Notes (Signed)
ANTICOAGULATION CONSULT NOTE - Initial Consult  Pharmacy Consult for IV heparin Indication: pulmonary embolus  No Known Allergies  Patient Measurements: Height: 6\' 7"  (200.7 cm) Weight: (!) 137 kg (302 lb 0.5 oz) IBW/kg (Calculated) : 93.7 Heparin Dosing Weight: 123 kg  Vital Signs: BP: 133/107 (11/28 1430) Pulse Rate: 157 (11/28 1430)  Labs: Recent Labs    06/24/21 1746 06/24/21 1752 06/24/21 1943 06/25/21 0500  HGB 15.1  --   --   --   HCT 45.5  --   --   --   PLT 291  --   --   --   CREATININE  --  1.25*  --  1.22  TROPONINIHS 15 15 14   --     Estimated Creatinine Clearance: 121.3 mL/min (by C-G formula based on SCr of 1.22 mg/dL).   Medical History: Past Medical History:  Diagnosis Date   CHF (congestive heart failure) (H[facility])     Medications:  Infusions:   sodium chloride     heparin      Assessment: 45 yo male on chronic Eliquis for hx PE.  Persistent nausea/vomiting, questionable absorption.  Planning [facility] tomorrow, pharmacy asked to convert to IV heparin.  Baseline CBC WNL.  Last Eliquis dose this AM in ER at 0940.  Heparin levels likely falsely elevated from Eliquis - will monitor aPTTs.  Goal of Therapy:  Heparin level 0.3-0.7 units/ml Aptt 66-102 Monitor platelets by anticoagulation protocol: Yes   Plan:  Hold Eliquis Check baseline heparin level and aPTT now. Start IV heparin at 10 pm at rate of 1700 units/hr. Check aPTT 6 hrs after heparin starts. Daily heparin level, PTT and CBC.  59, [facility], [facility] Clinical Pharmacist  06/25/2021 3:30 PM   [facility] pharmacy phone numbers are listed on [email or web address]

## 2021-06-25 NOTE — ED Notes (Signed)
Breakfast orders placed 

## 2021-06-25 NOTE — Progress Notes (Addendum)
Advanced Heart Failure Rounding Note  PCP-Cardiologist: None   Subjective:     Good diuresis overnight, per wife he voided 1800 cc in urinal. Less orthopnea and PND overnight.   Poor po intake last few days.  Hasn't been able to keep pills down for > 1 month. He's worried he hasn't been absorbing medicines.     Objective:   Weight Range: (!) 137 kg Body mass index is 34.03 kg/m.   Vital Signs:   Temp:  [98 F (36.7 C)-98.4 F (36.9 C)] 98 F (36.7 C) (11/27 1925) Pulse Rate:  [76-96] 79 (11/28 0715) Resp:  [12-25] 16 (11/28 0715) BP: (118-137)/(96-114) 134/107 (11/28 0715) SpO2:  [79 %-100 %] 93 % (11/28 0715) Weight:  [409 kg] 137 kg (11/27 1736)    Weight change: Filed Weights   06/24/21 1736  Weight: (!) 137 kg    Intake/Output:   Intake/Output Summary (Last 24 hours) at 06/25/2021 0825 Last data filed at 06/24/2021 2129 Gross per 24 hour  Intake --  Output 400 ml  Net -400 ml      Physical Exam    General:  Sitting comfortably in bed. HEENT: Normal Neck: Supple. JVP ~ 8-9 cm . Carotids 2+ bilat; no bruits.  Cor: PMI nondisplaced. Regular rate & rhythm. No rubs, gallops or murmurs. Lungs: Clear Abdomen: Soft, nontender, nondistended. No hepatosplenomegaly. No bruits or masses. Good bowel sounds. Extremities: No cyanosis, clubbing, rash, mild LE edema bilaterally Neuro: Alert & orientedx3, cranial nerves grossly intact. moves all 4 extremities w/o difficulty. Affect pleasant   Telemetry   NSR 70s -80s, PVCs (up to 5/min)   Labs    CBC Recent Labs    06/24/21 1746  WBC 4.1  HGB 15.1  HCT 45.5  MCV 92.1  PLT 291   Basic Metabolic Panel Recent Labs    81/19/14 1752 06/25/21 0500  NA 135 136  K 4.6 4.4  CL 103 104  CO2 22 23  GLUCOSE 137* 99  BUN 13 15  CREATININE 1.25* 1.22  CALCIUM 8.7* 8.6*   Liver Function Tests Recent Labs    06/24/21 1752  AST 24  ALT 18  ALKPHOS 43  BILITOT 2.1*  PROT 6.6  ALBUMIN 3.2*    Recent Labs    06/24/21 1752  LIPASE 21   Cardiac Enzymes No results for input(s): CKTOTAL, CKMB, CKMBINDEX, TROPONINI in the last 72 hours.  BNP: BNP (last 3 results) Recent Labs    07/07/20 1553 03/07/21 1100 06/24/21 1752  BNP 3,549.0* 303.7* 3,319.5*    ProBNP (last 3 results) No results for input(s): PROBNP in the last 8760 hours.   D-Dimer Recent Labs    06/24/21 1752  DDIMER 0.45   Hemoglobin A1C No results for input(s): HGBA1C in the last 72 hours. Fasting Lipid Panel No results for input(s): CHOL, HDL, LDLCALC, TRIG, CHOLHDL, LDLDIRECT in the last 72 hours. Thyroid Function Tests No results for input(s): TSH, T4TOTAL, T3FREE, THYROIDAB in the last 72 hours.  Invalid input(s): FREET3  Other results:   Imaging    DG Chest 2 View  Result Date: 06/24/2021 CLINICAL DATA:  Cough, congestion, fever and shortness of breath for 2 weeks. EXAM: CHEST - 2 VIEW COMPARISON:  04/20/2019. FINDINGS: Cardiac silhouette is mildly enlarged. No mediastinal or hilar masses or evidence of adenopathy. Mild interstitial thickening most evident in the peripheral lung bases. Lungs otherwise clear. No pleural effusion or pneumothorax. Skeletal structures are intact. IMPRESSION: 1. Cardiomegaly with mild interstitial  thickening. Mild congestive heart failure suspected. No evidence of pneumonia. Electronically Signed   By: Amie Portland M.D.   On: 06/24/2021 18:29     Medications:     Scheduled Medications:  apixaban  5 mg Oral BID   carvedilol  6.25 mg Oral BID WC   sacubitril-valsartan  1 tablet Oral BID   sodium chloride flush  3 mL Intravenous Q12H   spironolactone  25 mg Oral Daily   torsemide  20 mg Oral Daily    Infusions:  sodium chloride      PRN Medications: sodium chloride, acetaminophen, ondansetron (ZOFRAN) IV, sodium chloride flush    Patient Profile   45 yo male with history of chronic systolic HF, obesity, hx LV thrombus and bilateral PE now  admitted for a/c systolic HF  Assessment/Plan  2. Acute on Chronic Systolic Heart Failure - Echo 18/84/16 EF 10-15%. With LV thrombus. 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  - Echo 10/2016 with EF 25-30% laminated LV thrombus. Cannot exclude non-compaction.  - Bedside echo 12/21 EF ~20-25% - Echo 08/22 EF < 20%, RV okay, trivial MR - Admit 11/27 with a/c HF - progressive HF symptoms over the last few weeks after a viral illness. BNP 3319 (303 in 08/22). HS troponin negative X 2. Evidence of CHF on chest x-ray. - Even prior to admission notes medications made him feel sick, episodes of vomiting. - NYHA IIIb/IV. Diuresed well last night with IV lasix 20 mg. Give 20 mg lasix IV once again today then start po torsemide tomorrow (not taking diuretics PTA) - Continue Entresto 97/103 mg BID.  - Continue spiro 25 mg daily. - Continue carvedilol 6.25 BID for now, may need to hold if co-ox low - Plan to add SGLT2i prior to discharge. Check A1c. 6.7 in 06/2020. - May need to consider adding back digoxin  - Monitor Scr, 1.22 today (baseline 0.8-1) - Concerned some of his recent symptoms including GI upset may be due to low output. Could also be due to decompensated HF.  Has narrow pulse pressure. LFTs and HCO3 okay.  - Place PICC line to check co-ox and monitor CVP - Echo today - Plan for [facility] this admit +/- cMRI - QRS narrow, not a candidate for CRT - May need to consider workup for advanced therapies   2. Bilateral PE  - Noted on CTA 11/17  - On chronic Eliquis. Not sure how much he has been absorbing d/t frequent vomiting after medications - Based on Amplify-EXT data can consider dropping to 2.5 bid but given size, elected not to drop dose  3. Large LV thrombus - Noted on Echo on 05/2016 - Stable on Eliquis 5 mg BID. Denies bleeding. See above - Repeat echo this admit     Length of Stay: 1  FINCH, LINDSAY N, PA-C  06/25/2021, 8:25 AM  Advanced Heart Failure Team Pager [phone]  (M-F; 7a - 5p)  Please contact [facility] for night-coverage after hours (5p -7a ) and weekends on [email or web address]   Patient seen and examined with the above-signed Advanced Practice Provider and/or Housestaff. I personally reviewed laboratory data, imaging studies and relevant notes. I independently examined the patient and formulated the important aspects of the plan. I have edited the note to reflect any of my changes or salient points. I have personally discussed the plan with the patient and/or family.  45 y/o male with severe systolic HF due to NICM. Was doing quite well at last HF visit in  8/22. However over the last few weeks has had class IV symptoms with fatigue, SOB, n/v and anorexia.   General:  Lying in bed  No resp difficulty HEENT: normal Neck: supple. No JVP to jaw Carotids 2+ bilat; no bruits. No lymphadenopathy or thryomegaly appreciated. Cor: PMI laterally displaced. Regular rate & rhythm. +s3 Lungs: clear Abdomen: soft, nontender, nondistended. No hepatosplenomegaly. No bruits or masses. Good bowel sounds. Extremities: no cyanosis, clubbing, rash, 1-2+ edema Neuro: alert & orientedx3, cranial nerves grossly intact. moves all 4 extremities w/o difficulty. Affect pleasant  Suspect low output HF with mild to moderate volume overload. Will place PICC to assess CVP and co-ox. Suspect he may need milrinone. May need to consider advanced therapies.   Glori Bickers, MD  12:32 PM

## 2021-06-25 NOTE — Progress Notes (Signed)
  Echocardiogram 2D Echocardiogram has been performed.  Richard [name] 06/25/2021, 11:54 AM

## 2021-06-25 NOTE — Progress Notes (Signed)
   Followed by Advanced Heart Failure Team will follow up at d/c in Advanced Heart Failure Team.    Tonye Becket NP-C  11:39 AM

## 2021-06-25 NOTE — ED Notes (Signed)
Pt placed on 2L [place] due to O2 dropping while resting.

## 2021-06-25 NOTE — Progress Notes (Signed)
Pt admitted to 6E03. DL Picc line in place for CVP/Co-Ox. CVP 9-11. Pt A&Ox4, lungs clear. I attempted to get consent for R&L heart cath per orders-upon discussing the procedure pt states "They didn't tell me all of that". Will notify cath lab RN in am. Dierdre Highman, RN

## 2021-06-25 NOTE — ED Notes (Signed)
Pt resting comfortably at this time. Denies complaints. Family at bedside.

## 2021-06-25 NOTE — Progress Notes (Signed)
Peripherally Inserted Central Catheter Placement  The IV Nurse has discussed with the patient and/or persons authorized to consent for the patient, the purpose of this procedure and the potential benefits and risks involved with this procedure.  The benefits include less needle sticks, lab draws from the catheter, and the patient may be discharged home with the catheter. Risks include, but not limited to, infection, bleeding, blood clot (thrombus formation), and puncture of an artery; nerve damage and irregular heartbeat and possibility to perform a PICC exchange if needed/ordered by physician.  Alternatives to this procedure were also discussed.  Bard Power PICC patient education guide, fact sheet on infection prevention and patient information card has been provided to patient /or left at bedside.    PICC Placement Documentation  PICC Double Lumen 06/25/21 PICC Right Brachial 44 cm 0 cm (Active)  Indication for Insertion or Continuance of Line Poor Vasculature-patient has had multiple peripheral attempts or PIVs lasting less than 24 hours 06/25/21 1258  Exposed Catheter (cm) 0 cm 06/25/21 1258  Site Assessment Clean;Dry;Intact 06/25/21 1258  Lumen #1 Status Flushed;Blood return noted;Saline locked 06/25/21 1258  Lumen #2 Status Flushed;Blood return noted;Saline locked 06/25/21 1258  Dressing Type Transparent 06/25/21 1258  Dressing Status Clean;Dry;Intact 06/25/21 1258  Antimicrobial disc in place? Yes 06/25/21 1258  Dressing Change Due 07/02/21 06/25/21 1258       Audrie Gallus 06/25/2021, 1:00 PM

## 2021-06-26 ENCOUNTER — Other Ambulatory Visit (HOSPITAL_COMMUNITY): Payer: Self-pay

## 2021-06-26 DIAGNOSIS — I5023 Acute on chronic systolic (congestive) heart failure: Secondary | ICD-10-CM | POA: Diagnosis not present

## 2021-06-26 DIAGNOSIS — I472 Ventricular tachycardia, unspecified: Secondary | ICD-10-CM | POA: Diagnosis not present

## 2021-06-26 DIAGNOSIS — I509 Heart failure, unspecified: Secondary | ICD-10-CM | POA: Diagnosis not present

## 2021-06-26 LAB — COOXEMETRY PANEL
Carboxyhemoglobin: 1.1 % (ref 0.5–1.5)
Methemoglobin: 0.7 % (ref 0.0–1.5)
O2 Saturation: 62.1 %
Total hemoglobin: 14 g/dL (ref 12.0–16.0)

## 2021-06-26 LAB — BASIC METABOLIC PANEL
Anion gap: 5 (ref 5–15)
BUN: 15 mg/dL (ref 6–20)
CO2: 29 mmol/L (ref 22–32)
Calcium: 8.5 mg/dL — ABNORMAL LOW (ref 8.9–10.3)
Chloride: 106 mmol/L (ref 98–111)
Creatinine, Ser: 1.3 mg/dL — ABNORMAL HIGH (ref 0.61–1.24)
GFR, Estimated: >60 mL/min (ref >60)
Glucose, Bld: 103 mg/dL — ABNORMAL HIGH (ref 70–99)
Potassium: 3.5 mmol/L (ref 3.5–5.1)
Sodium: 140 mmol/L (ref 135–145)

## 2021-06-26 LAB — CBC
HCT: 40.9 % (ref 39.0–52.0)
Hemoglobin: 13.6 g/dL (ref 13.0–17.0)
MCH: 30.2 pg (ref 26.0–34.0)
MCHC: 33.3 g/dL (ref 30.0–36.0)
MCV: 90.7 fL (ref 80.0–100.0)
Platelets: 231 10*3/uL (ref 150–400)
RBC: 4.51 MIL/uL (ref 4.22–5.81)
RDW: 14.8 % (ref 11.5–15.5)
WBC: 5 10*3/uL (ref 4.0–10.5)
nRBC: 0 % (ref 0.0–0.2)

## 2021-06-26 LAB — HEMOGLOBIN A1C
Hgb A1c MFr Bld: 6.2 % — ABNORMAL HIGH (ref 4.8–5.6)
Mean Plasma Glucose: 131 mg/dL

## 2021-06-26 LAB — HEPARIN LEVEL (UNFRACTIONATED): Heparin Unfractionated: 0.73 IU/mL — ABNORMAL HIGH (ref 0.30–0.70)

## 2021-06-26 LAB — APTT
aPTT: 54 seconds — ABNORMAL HIGH (ref 24–36)
aPTT: 93 seconds — ABNORMAL HIGH (ref 24–36)
aPTT: 80 seconds — ABNORMAL HIGH (ref 24–36)

## 2021-06-26 MED ORDER — POTASSIUM CHLORIDE CRYS ER 20 MEQ PO TBCR
40.0000 meq | EXTENDED_RELEASE_TABLET | Freq: Once | ORAL | Status: AC
  Administered 2021-06-26: 40 meq via ORAL

## 2021-06-26 MED ORDER — FUROSEMIDE 10 MG/ML IJ SOLN
80.0000 mg | Freq: Two times a day (BID) | INTRAMUSCULAR | Status: AC
  Administered 2021-06-26 (×2): 80 mg via INTRAVENOUS

## 2021-06-26 MED ORDER — POTASSIUM CHLORIDE CRYS ER 20 MEQ PO TBCR
EXTENDED_RELEASE_TABLET | ORAL | Status: AC
  Administered 2021-06-26: 40 meq
  Filled 2021-06-26: qty 2

## 2021-06-26 MED ORDER — POTASSIUM CHLORIDE CRYS ER 20 MEQ PO TBCR
EXTENDED_RELEASE_TABLET | ORAL | Status: AC
  Filled 2021-06-26: qty 2

## 2021-06-26 MED ORDER — FUROSEMIDE 10 MG/ML IJ SOLN
INTRAMUSCULAR | Status: AC
  Filled 2021-06-26: qty 8

## 2021-06-26 MED ORDER — POTASSIUM CHLORIDE CRYS ER 20 MEQ PO TBCR
40.0000 meq | EXTENDED_RELEASE_TABLET | Freq: Once | ORAL | Status: DC
  Filled 2021-06-26: qty 2

## 2021-06-26 NOTE — Progress Notes (Signed)
ANTICOAGULATION CONSULT NOTE   Pharmacy Consult for IV heparin Indication: pulmonary embolus  No Known Allergies  Patient Measurements: Height: 6\' 7"  (200.7 cm) Weight: (!) 138.4 kg (305 lb 3.2 oz) IBW/kg (Calculated) : 93.7 Heparin Dosing Weight: 123 kg  Vital Signs: Temp: 98.3 F (36.8 C) (11/29 1954) Temp Source: Oral (11/29 1954) BP: 113/88 (11/29 1954) Pulse Rate: 78 (11/29 1954)  Labs: Recent Labs    06/24/21 1746 06/24/21 1752 06/24/21 1943 06/25/21 0500 06/25/21 1615 06/25/21 1615 06/26/21 0430 06/26/21 1127 06/26/21 1923  HGB 15.1  --   --   --   --   --  13.6  --   --   HCT 45.5  --   --   --   --   --  40.9  --   --   PLT 291  --   --   --   --   --  231  --   --   APTT  --   --   --   --  35   < > 54* 93* 80*  HEPARINUNFRC  --   --   --   --  >1.10*  --  0.73*  --   --   CREATININE  --  1.25*  --  1.22  --   --  1.30*  --   --   TROPONINIHS 15 15 14   --   --   --   --   --   --    < > = values in this interval not displayed.     Estimated Creatinine Clearance: 114.5 mL/min (A) (by C-G formula based on SCr of 1.3 mg/dL (H)).   Medical History: Past Medical History:  Diagnosis Date   CHF (congestive heart failure) (HCC)     Medications:  Infusions:   sodium chloride     sodium chloride     sodium chloride     heparin 2,000 Units/hr (06/26/21 1229)    Assessment: 45 yo male on chronic Eliquis for hx PE (last dose 11/28 0940 in ED).  Persistent nausea/vomiting, questionable absorption.  Planning [facility] 11/29, pharmacy asked to convert to IV heparin. Will use aPTT monitoring while anti-Xa activity impacted by DOAC.  Confirmatory aPTT of 80 therapeutic on heparin rate of 2000 units/hr. CBC normal. No s/sx bleeding or infusion problems noted.  Goal of Therapy:  Heparin level 0.3-0.7 units/ml aPTT 66-102 Monitor platelets by anticoagulation protocol: Yes   Plan:  Continue heparin infusion at 2000 units/hr. Daily CBC, heparin level, and aPTT  until correlating. F/U transition to PO anticoagulation after cath  [facility] PharmD., BCPS Clinical Pharmacist 06/26/2021 8:28 PM

## 2021-06-26 NOTE — Progress Notes (Signed)
Pt had 9 beats V tach, asymptomatic, was going to the bathroom. Rhonda barrett PA-C updated with new verbal order for Potassium 40 mEq x 1.

## 2021-06-26 NOTE — Progress Notes (Addendum)
ANTICOAGULATION CONSULT NOTE - Initial Consult  Pharmacy Consult for IV heparin Indication: pulmonary embolus  No Known Allergies  Patient Measurements: Height: 6\' 7"  (200.7 cm) Weight: (!) 138.4 kg (305 lb 3.2 oz) IBW/kg (Calculated) : 93.7 Heparin Dosing Weight: 123 kg  Vital Signs: Temp: 98.2 F (36.8 C) (11/29 0534) Temp Source: Oral (11/29 0534) BP: 114/80 (11/29 0534) Pulse Rate: 72 (11/29 0534)  Labs: Recent Labs    06/24/21 1746 06/24/21 1752 06/24/21 1943 06/25/21 0500 06/25/21 1615 06/26/21 0430  HGB 15.1  --   --   --   --  13.6  HCT 45.5  --   --   --   --  40.9  PLT 291  --   --   --   --  231  APTT  --   --   --   --  35 54*  HEPARINUNFRC  --   --   --   --  >1.10* 0.73*  CREATININE  --  1.25*  --  1.22  --  1.30*  TROPONINIHS 15 15 14   --   --   --      Estimated Creatinine Clearance: 114.5 mL/min (A) (by C-G formula based on SCr of 1.3 mg/dL (H)).   Medical History: Past Medical History:  Diagnosis Date   CHF (congestive heart failure) (HCC)     Medications:  Infusions:   sodium chloride     sodium chloride     sodium chloride     heparin 2,000 Units/hr (06/26/21 )    Assessment: 45 yo male on chronic Eliquis for hx PE (last dose 11/28 0940 in ED).  Persistent nausea/vomiting, questionable absorption.  Planning [facility] 11/29, pharmacy asked to convert to IV heparin. Will use aPTT monitoring while anti-Xa activity impacted by DOAC.  aPTT of 93 therapeutic after rate increase to 2000 units/hr. CBC normal. No s/sx bleeding or infusion problems per nurse.  Goal of Therapy:  Heparin level 0.3-0.7 units/ml aPTT 66-102 Monitor platelets by anticoagulation protocol: Yes   Plan:  Continue heparin infusion at 2000 units/hr. 6 hour confirmatory heparin level Daily CBC, heparin level, and aPTT until correlating. F/U transition to PO anticoagulation after cath  [facility], PharmD PGY1 Pharmacy Resident 06/26/2021  10:27 AM  Please  check [email or web address] for unit-specific pharmacy phone numbers.

## 2021-06-26 NOTE — Progress Notes (Signed)
At approx 0330 pt had 10 bts NSVT. Sleeping soundly-easily aroused, no complaints voiced. Strip saved. Will continue to monitor. Dierdre Highman, RN

## 2021-06-26 NOTE — Progress Notes (Signed)
ANTICOAGULATION CONSULT NOTE - Follow Up Consult  Pharmacy Consult for heparin Indication:  h/o VTE  Labs: Recent Labs    06/24/21 1746 06/24/21 1752 06/24/21 1943 06/25/21 0500 06/25/21 1615 06/26/21 0430  HGB 15.1  --   --   --   --  13.6  HCT 45.5  --   --   --   --  40.9  PLT 291  --   --   --   --  231  APTT  --   --   --   --  35 54*  HEPARINUNFRC  --   --   --   --  >1.10*  --   CREATININE  --  1.25*  --  1.22  --   --   TROPONINIHS 15 15 14   --   --   --     Assessment: 45yo male subtherapeutic on heparin with initial dosing while Eliquis on hold; no infusion issues or signs of bleeding per RN.  Goal of Therapy:  aPTT 66-102 seconds   Plan:  Will increase heparin infusion by 2-3 units/kg/hr to 2000 units/hr and check PTT in 6 hours.    , PharmD, BCPS  06/26/2021,5:33 AM

## 2021-06-26 NOTE — Progress Notes (Addendum)
Advanced Heart Failure Rounding Note  PCP-Cardiologist: None   Subjective:   11/28 PICC placed.  CO-OX stable. 62%   Feels ok. Denies SOB.   Objective:   Weight Range: (!) 138.4 kg Body mass index is 34.38 kg/m.   Vital Signs:   Temp:  [98 F (36.7 C)-98.4 F (36.9 C)] 98.2 F (36.8 C) (11/29 0534) Pulse Rate:  [71-157] 72 (11/29 0534) Resp:  [15-24] 19 (11/29 0534) BP: (104-133)/(68-107) 114/80 (11/29 0534) SpO2:  [95 %-100 %] 97 % (11/29 0534) Weight:  [138.4 kg] 138.4 kg (11/29 0534) Last BM Date: 06/25/21  Weight change: Filed Weights   06/24/21 1736 06/26/21 0534  Weight: (!) 137 kg (!) 138.4 kg    Intake/Output:   Intake/Output Summary (Last 24 hours) at 06/26/2021 1047 Last data filed at 06/26/2021 0300 Gross per 24 hour  Intake 76.89 ml  Output 1700 ml  Net -1623.11 ml      Physical Exam   CVP 14-15 General:  Well appearing. No resp difficulty HEENT: normal Neck: supple. JVP to  jaw.  Carotids 2+ bilat; no bruits. No lymphadenopathy or thryomegaly appreciated. Cor: PMI nondisplaced. Regular rate & rhythm. No rubs, gallops or murmurs. Lungs: clear Abdomen: soft, nontender, nondistended. No hepatosplenomegaly. No bruits or masses. Good bowel sounds. Extremities: no cyanosis, clubbing, rash, edema. RUE PICC Neuro: alert & orientedx3, cranial nerves grossly intact. moves all 4 extremities w/o difficulty. Affect pleasant    Telemetry  NSR 70-80s with occasional PVCs    Labs    CBC Recent Labs    06/24/21 1746 06/26/21 0430  WBC 4.1 5.0  HGB 15.1 13.6  HCT 45.5 40.9  MCV 92.1 90.7  PLT 291 231   Basic Metabolic Panel Recent Labs    99/24/26 0500 06/26/21 0430  NA 136 140  K 4.4 3.5  CL 104 106  CO2 23 29  GLUCOSE 99 103*  BUN 15 15  CREATININE 1.22 1.30*  CALCIUM 8.6* 8.5*   Liver Function Tests Recent Labs    06/24/21 1752  AST 24  ALT 18  ALKPHOS 43  BILITOT 2.1*  PROT 6.6  ALBUMIN 3.2*   Recent Labs     06/24/21 1752  LIPASE 21   Cardiac Enzymes No results for input(s): CKTOTAL, CKMB, CKMBINDEX, TROPONINI in the last 72 hours.  BNP: BNP (last 3 results) Recent Labs    07/07/20 1553 03/07/21 1100 06/24/21 1752  BNP 3,549.0* 303.7* 3,319.5*    ProBNP (last 3 results) No results for input(s): PROBNP in the last 8760 hours.   D-Dimer Recent Labs    06/24/21 1752  DDIMER 0.45   Hemoglobin A1C Recent Labs    06/25/21 1615  HGBA1C 6.2*   Fasting Lipid Panel No results for input(s): CHOL, HDL, LDLCALC, TRIG, CHOLHDL, LDLDIRECT in the last 72 hours. Thyroid Function Tests No results for input(s): TSH, T4TOTAL, T3FREE, THYROIDAB in the last 72 hours.  Invalid input(s): FREET3  Other results:   Imaging    ECHOCARDIOGRAM COMPLETE  Result Date: 06/25/2021    ECHOCARDIOGRAM REPORT   Patient Name:   Richard [name] Date of Exam: 06/25/2021 Medical Rec #:  [id number]       Height:       79.0 in Accession #:    [id number]      Weight:       302.0 lb Date of Birth:  Dec 25, 1975       BSA:          2.715  m Patient Age:    45 years        BP:           111/85 mmHg Patient Gender: M               HR:           74 bpm. Exam Location:  Inpatient Procedure: 2D Echo, Cardiac Doppler, Color Doppler and Intracardiac            Opacification Agent Indications:    Congestive Heart Failure I50.9  History:        Patient has prior history of Echocardiogram examinations, most                 recent 03/21/2021. CHF. Bilateral pulmonary embolism. Shortness                 of breath and volume overload.  Sonographer:    Leta Jungling RDCS Referring Phys: [id number] DENNIS NARCISSE JR IMPRESSIONS  1. Global longitudinal strain is -3.2%. Severe global hypokinesis with apical akinesis.. Left ventricular ejection fraction, by estimation, is <20%. The left ventricle has severely decreased function. The left ventricular internal cavity size was severely dilated.  2. Right ventricular systolic function is  moderately reduced. The right ventricular size is normal. There is mildly elevated pulmonary artery systolic pressure.  3. Left atrial size was moderately dilated.  4. Right atrial size was mildly dilated.  5. Mild to moderate mitral valve regurgitation.  6. Aortic valve regurgitation is not visualized. Aortic valve sclerosis is present, with no evidence of aortic valve stenosis.  7. The inferior vena cava is dilated in size with <50% respiratory variability, suggesting right atrial pressure of 15 mmHg. FINDINGS  Left Ventricle: Global longitudinal strain is -3.2%. Severe global hypokinesis with apical akinesis. Left ventricular ejection fraction, by estimation, is <20%. The left ventricle has severely decreased function. Definity contrast agent was given IV to delineate the left ventricular endocardial borders. The left ventricular internal cavity size was severely dilated. There is no left ventricular hypertrophy. Right Ventricle: The right ventricular size is normal. Right vetricular wall thickness was not assessed. Right ventricular systolic function is moderately reduced. There is mildly elevated pulmonary artery systolic pressure. The tricuspid regurgitant velocity is 2.73 m/s, and with an assumed right atrial pressure of 15 mmHg, the estimated right ventricular systolic pressure is 44.8 mmHg. Left Atrium: Left atrial size was moderately dilated. Right Atrium: Right atrial size was mildly dilated. Pericardium: There is no evidence of pericardial effusion. Mitral Valve: There is mild thickening of the mitral valve leaflet(s). Mild to moderate mitral valve regurgitation. Tricuspid Valve: The tricuspid valve is normal in structure. Tricuspid valve regurgitation is mild. Aortic Valve: Aortic valve regurgitation is not visualized. Aortic valve sclerosis is present, with no evidence of aortic valve stenosis. Pulmonic Valve: The pulmonic valve was normal in structure. Pulmonic valve regurgitation is trivial. Aorta:  The aortic root and ascending aorta are structurally normal, with no evidence of dilitation. Venous: The inferior vena cava is dilated in size with less than 50% respiratory variability, suggesting right atrial pressure of 15 mmHg. IAS/Shunts: No atrial level shunt detected by color flow Doppler.  LEFT VENTRICLE PLAX 2D LVIDd:         6.90 cm      Diastology LVIDs:         5.90 cm      LV e' medial:    4.96 cm/s LV PW:  0.90 cm      LV E/e' medial:  13.2 LV IVS:        1.00 cm      LV e' lateral:   6.72 cm/s LVOT diam:     2.50 cm      LV E/e' lateral: 9.7 LV SV:         38 LV SV Index:   14 LVOT Area:     4.91 cm                              3D Volume EF: LV Volumes (MOD)            3D EF:        17 % LV vol d, MOD A2C: 273.0 ml LV EDV:       353 ml LV vol d, MOD A4C: 346.0 ml LV ESV:       293 ml LV vol s, MOD A2C: 260.0 ml LV SV:        61 ml LV vol s, MOD A4C: 290.0 ml LV SV MOD A2C:     13.0 ml LV SV MOD A4C:     346.0 ml LV SV MOD BP:      35.9 ml RIGHT VENTRICLE RV S prime:     13.30 cm/s TAPSE (M-mode): 1.4 cm LEFT ATRIUM              Index        RIGHT ATRIUM           Index LA diam:        5.90 cm  2.17 cm/m   RA Area:     24.00 cm LA Vol (A2C):   150.0 ml 55.24 ml/m  RA Volume:   75.50 ml  27.81 ml/m LA Vol (A4C):   87.2 ml  32.11 ml/m LA Biplane Vol: 120.0 ml 44.19 ml/m  AORTIC VALVE LVOT Vmax:   52.00 cm/s LVOT Vmean:  35.800 cm/s LVOT VTI:    0.078 m  AORTA Ao Root diam: 3.90 cm Ao Asc diam:  3.50 cm MITRAL VALVE                  TRICUSPID VALVE MV Area (PHT): 5.54 cm       TR Peak grad:   29.8 mmHg MV Decel Time: 137 msec       TR Vmax:        273.00 cm/s MR Peak grad:    55.4 mmHg MR Mean grad:    39.0 mmHg    SHUNTS MR Vmax:         372.00 cm/s  Systemic VTI:  0.08 m MR Vmean:        298.0 cm/s   Systemic Diam: 2.50 cm MR PISA:         2.26 cm MR PISA Eff ROA: 21 mm MR PISA Radius:  0.60 cm MV E velocity: 65.40 cm/s MV A velocity: 29.00 cm/s MV E/A ratio:  2.26 Dietrich Pates MD  Electronically signed by Dietrich Pates MD Signature Date/Time: 06/25/2021/2:02:53 PM    Final      Medications:     Scheduled Medications:  carvedilol  6.25 mg Oral BID WC   Chlorhexidine Gluconate Cloth  6 each Topical Daily   potassium chloride  40 mEq Oral Once   sacubitril-valsartan  1 tablet Oral BID   sodium chloride flush  10-40 mL Intracatheter Q12H  sodium chloride flush  3 mL Intravenous Q12H   sodium chloride flush  3 mL Intravenous Q12H   spironolactone  25 mg Oral Daily    Infusions:  sodium chloride     sodium chloride     sodium chloride     heparin 2,000 Units/hr (06/26/21 0713)    PRN Medications: sodium chloride, sodium chloride, acetaminophen, ondansetron (ZOFRAN) IV, sodium chloride flush, sodium chloride flush, sodium chloride flush    Patient Profile   45 yo male with history of chronic systolic HF, obesity, hx LV thrombus and bilateral PE now admitted for a/c systolic HF  Assessment/Plan  2. Acute on Chronic Systolic Heart Failure - Echo 56/21/30 EF 10-15%. With LV thrombus. 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  - Echo 10/2016 with EF 25-30% laminated LV thrombus. Cannot exclude non-compaction.  - Echo 08/22 EF < 20%, RV okay, trivial MR - Admit 11/27 with a/c HF - progressive HF symptoms over the last few weeks after a viral illness. BNP 3319 (303 in 08/22). HS troponin negative X 2. Evidence of CHF on chest x-ray. - Echo repeated EF < 20%  - CO-OX stable. CVP 14-15. Give 80 mg IV lasix twice today.  -Cath tomorrow to assess coronaries and hemodynamics.  - Continue Entresto 97/103 mg BID.  - Continue spiro 25 mg daily. - Continue carvedilol 6.25 BID for now, may need to hold if co-ox low - Plan to add SGLT2i prior to discharge. Check A1c. 6.7 in 06/2020. - May need to consider adding back digoxin  - Renal function stable.  - - QRS narrow, not a candidate for CRT - May need to consider workup for advanced therapies   2. Bilateral PE  -  Noted on CTA 11/17  - On chronic Eliquis. Not sure how much he has been absorbing d/t frequent vomiting after medications - Based on Amplify-EXT data can consider dropping to 2.5 bid but given size, elected not to drop dose  3. Large LV thrombus - Noted on Echo on 05/2016 - Stable on Eliquis 5 mg BID. Denies bleeding. See above - Repeat echo - no thrombus.   CVP elevated. Restart IV lasix. Hold cath and set up for tomorrow at 830    Length of Stay: 2  Richard Becket, NP  06/26/2021, 10:47 AM  Advanced Heart Failure Team Pager [phone] (M-F; 7a - 5p)  Please contact [facility] for night-coverage after hours (5p -7a ) and weekends on [email or web address]  Patient seen and examined with the above-signed Advanced Practice Provider and/or Housestaff. I personally reviewed laboratory data, imaging studies and relevant notes. I independently examined the patient and formulated the important aspects of the plan. I have edited the note to reflect any of my changes or salient points. I have personally discussed the plan with the patient and/or family.  Says dyspnea is improved with diuresis. Denies CP, orthopnea or PND, Appetite improved.  Co-ox 62% despite severe LV dysfunction. CVP elevated 14-15 (checked personally)  General:  lying in bed No resp difficulty HEENT: normal Neck: supple. JVP to jaw. Carotids 2+ bilat; no bruits. No lymphadenopathy or thryomegaly appreciated. Cor: PMI nondisplaced. Regular rate & rhythm. No rubs, gallops or murmurs. Lungs: clear Abdomen: soft, nontender, nondistended. No hepatosplenomegaly. No bruits or masses. Good bowel sounds. Extremities: no cyanosis, clubbing, rash, tr edema Neuro: alert & orientedx3, cranial nerves grossly intact. moves all 4 extremities w/o difficulty. Affect pleasant  Co-ox ok. CVP remains elevated. Continue IV diuresis.   Plan R/L  cath tomorrow. If cath unremarkable will need CPX testing to stratify for need for advanced HF therapies.    Arvilla Meres, MD  11:17 PM

## 2021-06-26 NOTE — TOC Benefit Eligibility Note (Signed)
Patient Advocate Encounter  Insurance verification completed.    The patient is currently admitted and upon discharge could be taking Farxiga 10 mg.  Requires Prior Authorization  The patient is currently admitted and upon discharge could be taking Jardiance 10 mg.  Requires Prior Authorization  The patient is insured through [employer]     Chay Mazzoni, CPhT Pharmacy Patient Advocate Specialist  Pharmacy Patient Advocate Team Direct Number: [phone]  Fax: [phone]        

## 2021-06-26 NOTE — Progress Notes (Signed)
Heart Failure Navigator Progress Note  Assessed for Heart & Vascular TOC clinic readiness.  Patient does not meet criteria due to AHF rounding team consulted this hospitalization.   Navigator available for reassessment of patient.   Berkeley Vanaken, MSN, RN Heart Failure Nurse Navigator [phone]   

## 2021-06-27 ENCOUNTER — Encounter (HOSPITAL_COMMUNITY): Payer: Self-pay | Admitting: Internal Medicine

## 2021-06-27 ENCOUNTER — Encounter (HOSPITAL_COMMUNITY)
Admission: EM | Disposition: A | Discharge status: Home / Self Care | Payer: Self-pay | Source: Home / Self Care | Attending: Internal Medicine

## 2021-06-27 DIAGNOSIS — I509 Heart failure, unspecified: Secondary | ICD-10-CM | POA: Diagnosis not present

## 2021-06-27 DIAGNOSIS — I5023 Acute on chronic systolic (congestive) heart failure: Secondary | ICD-10-CM | POA: Diagnosis not present

## 2021-06-27 DIAGNOSIS — I472 Ventricular tachycardia, unspecified: Secondary | ICD-10-CM

## 2021-06-27 HISTORY — PX: RIGHT/LEFT HEART CATH AND CORONARY ANGIOGRAPHY: CATH118266

## 2021-06-27 LAB — POCT I-STAT 7, (LYTES, BLD GAS, ICA,H+H)
Acid-Base Excess: 3 mmol/L — ABNORMAL HIGH (ref 0.0–2.0)
Bicarbonate: 29.1 mmol/L — ABNORMAL HIGH (ref 20.0–28.0)
Calcium, Ion: 1.08 mmol/L — ABNORMAL LOW (ref 1.15–1.40)
HCT: 46 % (ref 39.0–52.0)
Hemoglobin: 15.6 g/dL (ref 13.0–17.0)
O2 Saturation: 97 %
Potassium: 4 mmol/L (ref 3.5–5.1)
Sodium: 144 mmol/L (ref 135–145)
TCO2: 31 mmol/L (ref 22–32)
pCO2 arterial: 47.3 mmHg (ref 32.0–48.0)
pH, Arterial: 7.398 (ref 7.350–7.450)
pO2, Arterial: 93 mmHg (ref 83.0–108.0)

## 2021-06-27 LAB — POCT I-STAT EG7
Acid-Base Excess: 6 mmol/L — ABNORMAL HIGH (ref 0.0–2.0)
Acid-Base Excess: 8 mmol/L — ABNORMAL HIGH (ref 0.0–2.0)
Acid-base deficit: 1 mmol/L (ref 0.0–2.0)
Bicarbonate: 25.8 mmol/L (ref 20.0–28.0)
Bicarbonate: 33.4 mmol/L — ABNORMAL HIGH (ref 20.0–28.0)
Bicarbonate: 35 mmol/L — ABNORMAL HIGH (ref 20.0–28.0)
Calcium, Ion: 0.74 mmol/L — CL (ref 1.15–1.40)
Calcium, Ion: 1.23 mmol/L (ref 1.15–1.40)
Calcium, Ion: 1.3 mmol/L (ref 1.15–1.40)
HCT: 39 % (ref 39.0–52.0)
HCT: 47 % (ref 39.0–52.0)
HCT: 48 % (ref 39.0–52.0)
Hemoglobin: 13.3 g/dL (ref 13.0–17.0)
Hemoglobin: 16 g/dL (ref 13.0–17.0)
Hemoglobin: 16.3 g/dL (ref 13.0–17.0)
O2 Saturation: 70 %
O2 Saturation: 72 %
O2 Saturation: 76 %
Potassium: 2.9 mmol/L — ABNORMAL LOW (ref 3.5–5.1)
Potassium: 4.2 mmol/L (ref 3.5–5.1)
Potassium: 4.3 mmol/L (ref 3.5–5.1)
Sodium: 141 mmol/L (ref 135–145)
Sodium: 142 mmol/L (ref 135–145)
Sodium: 146 mmol/L — ABNORMAL HIGH (ref 135–145)
TCO2: 27 mmol/L (ref 22–32)
TCO2: 35 mmol/L — ABNORMAL HIGH (ref 22–32)
TCO2: 37 mmol/L — ABNORMAL HIGH (ref 22–32)
pCO2, Ven: 48.3 mmHg (ref 44.0–60.0)
pCO2, Ven: 56 mmHg (ref 44.0–60.0)
pCO2, Ven: 56.1 mmHg (ref 44.0–60.0)
pH, Ven: 7.335 (ref 7.250–7.430)
pH, Ven: 7.383 (ref 7.250–7.430)
pH, Ven: 7.403 (ref 7.250–7.430)
pO2, Ven: 38 mmHg (ref 32.0–45.0)
pO2, Ven: 39 mmHg (ref 32.0–45.0)
pO2, Ven: 44 mmHg (ref 32.0–45.0)

## 2021-06-27 LAB — BASIC METABOLIC PANEL
Anion gap: 7 (ref 5–15)
BUN: 15 mg/dL (ref 6–20)
CO2: 33 mmol/L — ABNORMAL HIGH (ref 22–32)
Calcium: 8.6 mg/dL — ABNORMAL LOW (ref 8.9–10.3)
Chloride: 98 mmol/L (ref 98–111)
Creatinine, Ser: 1.31 mg/dL — ABNORMAL HIGH (ref 0.61–1.24)
GFR, Estimated: >60 mL/min (ref >60)
Glucose, Bld: 101 mg/dL — ABNORMAL HIGH (ref 70–99)
Potassium: 3.7 mmol/L (ref 3.5–5.1)
Sodium: 138 mmol/L (ref 135–145)

## 2021-06-27 LAB — COOXEMETRY PANEL
Carboxyhemoglobin: 1.1 % (ref 0.5–1.5)
Methemoglobin: 0.9 % (ref 0.0–1.5)
O2 Saturation: 72.1 %
Total hemoglobin: 14.7 g/dL (ref 12.0–16.0)

## 2021-06-27 LAB — CBC
HCT: 45.3 % (ref 39.0–52.0)
Hemoglobin: 15.1 g/dL (ref 13.0–17.0)
MCH: 30.4 pg (ref 26.0–34.0)
MCHC: 33.3 g/dL (ref 30.0–36.0)
MCV: 91.3 fL (ref 80.0–100.0)
Platelets: 207 10*3/uL (ref 150–400)
RBC: 4.96 MIL/uL (ref 4.22–5.81)
RDW: 14.8 % (ref 11.5–15.5)
WBC: 4.7 10*3/uL (ref 4.0–10.5)
nRBC: 0 % (ref 0.0–0.2)

## 2021-06-27 LAB — HEPARIN LEVEL (UNFRACTIONATED): Heparin Unfractionated: 0.68 IU/mL (ref 0.30–0.70)

## 2021-06-27 LAB — APTT: aPTT: 88 seconds — ABNORMAL HIGH (ref 24–36)

## 2021-06-27 LAB — MAGNESIUM: Magnesium: 1.6 mg/dL — ABNORMAL LOW (ref 1.7–2.4)

## 2021-06-27 SURGERY — RIGHT/LEFT HEART CATH AND CORONARY ANGIOGRAPHY
Anesthesia: LOCAL

## 2021-06-27 MED ORDER — SODIUM CHLORIDE 0.9 % IV SOLN: 250.0000 mL | INTRAVENOUS | Status: DC | PRN

## 2021-06-27 MED ORDER — APIXABAN 5 MG PO TABS
5.0000 mg | ORAL_TABLET | Freq: Two times a day (BID) | ORAL | Status: DC
  Administered 2021-06-27 – 2021-06-28 (×3): 5 mg via ORAL
  Filled 2021-06-27 (×3): qty 1

## 2021-06-27 MED ORDER — ASPIRIN 81 MG PO CHEW
81.0000 mg | CHEWABLE_TABLET | Freq: Once | ORAL | Status: AC
  Administered 2021-06-27: 81 mg via ORAL
  Filled 2021-06-27: qty 1

## 2021-06-27 MED ORDER — FENTANYL CITRATE (PF) 100 MCG/2ML IJ SOLN
INTRAMUSCULAR | Status: DC | PRN
  Administered 2021-06-27: 50 ug via INTRAVENOUS

## 2021-06-27 MED ORDER — FENTANYL CITRATE (PF) 100 MCG/2ML IJ SOLN
INTRAMUSCULAR | Status: AC
  Filled 2021-06-27: qty 2

## 2021-06-27 MED ORDER — VERAPAMIL HCL 2.5 MG/ML IV SOLN
INTRAVENOUS | Status: AC
  Filled 2021-06-27: qty 2

## 2021-06-27 MED ORDER — LABETALOL HCL 5 MG/ML IV SOLN: 10.0000 mg | INTRAVENOUS | Status: AC | PRN

## 2021-06-27 MED ORDER — LIDOCAINE HCL (PF) 1 % IJ SOLN
INTRAMUSCULAR | Status: AC
  Filled 2021-06-27: qty 30

## 2021-06-27 MED ORDER — LIDOCAINE HCL (PF) 1 % IJ SOLN
INTRAMUSCULAR | Status: DC | PRN
  Administered 2021-06-27: 4 mL

## 2021-06-27 MED ORDER — ACETAMINOPHEN 325 MG PO TABS: 650.0000 mg | ORAL_TABLET | ORAL | Status: DC | PRN

## 2021-06-27 MED ORDER — HEPARIN SODIUM (PORCINE) 1000 UNIT/ML IJ SOLN
INTRAMUSCULAR | Status: DC | PRN
  Administered 2021-06-27: 7000 [IU] via INTRAVENOUS

## 2021-06-27 MED ORDER — SODIUM CHLORIDE 0.9 % IV SOLN: INTRAVENOUS | Status: DC

## 2021-06-27 MED ORDER — SODIUM CHLORIDE 0.9% FLUSH: 3.0000 mL | Freq: Two times a day (BID) | INTRAVENOUS | Status: DC

## 2021-06-27 MED ORDER — POTASSIUM CHLORIDE CRYS ER 20 MEQ PO TBCR
40.0000 meq | EXTENDED_RELEASE_TABLET | Freq: Once | ORAL | Status: AC
  Administered 2021-06-27: 40 meq via ORAL

## 2021-06-27 MED ORDER — VERAPAMIL HCL 2.5 MG/ML IV SOLN
INTRAVENOUS | Status: DC | PRN
  Administered 2021-06-27: 10 mL via INTRA_ARTERIAL

## 2021-06-27 MED ORDER — FUROSEMIDE 10 MG/ML IJ SOLN: 80.0000 mg | Freq: Two times a day (BID) | INTRAMUSCULAR | Status: DC

## 2021-06-27 MED ORDER — FUROSEMIDE 10 MG/ML IJ SOLN
80.0000 mg | Freq: Once | INTRAMUSCULAR | Status: AC
  Administered 2021-06-27: 80 mg via INTRAVENOUS
  Filled 2021-06-27: qty 8

## 2021-06-27 MED ORDER — MAGNESIUM SULFATE 4 GM/100ML IV SOLN
4.0000 g | Freq: Once | INTRAVENOUS | Status: AC
  Administered 2021-06-27: 4 g via INTRAVENOUS
  Filled 2021-06-27: qty 100

## 2021-06-27 MED ORDER — DIGOXIN 125 MCG PO TABS
0.1250 mg | ORAL_TABLET | Freq: Every day | ORAL | Status: DC
  Administered 2021-06-27 – 2021-06-28 (×2): 0.125 mg via ORAL
  Filled 2021-06-27 (×4): qty 1

## 2021-06-27 MED ORDER — MIDAZOLAM HCL 2 MG/2ML IJ SOLN
INTRAMUSCULAR | Status: AC
  Filled 2021-06-27: qty 2

## 2021-06-27 MED ORDER — SODIUM CHLORIDE 0.9% FLUSH: 3.0000 mL | INTRAVENOUS | Status: DC | PRN

## 2021-06-27 MED ORDER — IOHEXOL 350 MG/ML SOLN
INTRAVENOUS | Status: DC | PRN
  Administered 2021-06-27: 35 mL

## 2021-06-27 MED ORDER — MIDAZOLAM HCL 2 MG/2ML IJ SOLN
INTRAMUSCULAR | Status: DC | PRN
  Administered 2021-06-27: 2 mg via INTRAVENOUS

## 2021-06-27 MED ORDER — HYDRALAZINE HCL 20 MG/ML IJ SOLN: 10.0000 mg | INTRAMUSCULAR | Status: AC | PRN

## 2021-06-27 MED ORDER — ONDANSETRON HCL 4 MG/2ML IJ SOLN: 4.0000 mg | Freq: Four times a day (QID) | INTRAMUSCULAR | Status: DC | PRN

## 2021-06-27 MED ORDER — HEPARIN (PORCINE) IN NACL 1000-0.9 UT/500ML-% IV SOLN
INTRAVENOUS | Status: DC | PRN
  Administered 2021-06-27 (×2): 500 mL

## 2021-06-27 MED ORDER — HEPARIN SODIUM (PORCINE) 1000 UNIT/ML IJ SOLN
INTRAMUSCULAR | Status: AC
  Filled 2021-06-27: qty 10

## 2021-06-27 MED ORDER — HEPARIN (PORCINE) IN NACL 1000-0.9 UT/500ML-% IV SOLN
INTRAVENOUS | Status: AC
  Filled 2021-06-27: qty 1000

## 2021-06-27 SURGICAL SUPPLY — 11 items
CATH 5FR JL3.5 JR4 ANG PIG MP (CATHETERS) ×1 IMPLANT
CATH BALLN WEDGE 5F 110CM (CATHETERS) ×1 IMPLANT
DEVICE RAD COMP TR BAND LRG (VASCULAR PRODUCTS) ×1 IMPLANT
GLIDESHEATH SLEND SS 6F .021 (SHEATH) ×1 IMPLANT
GUIDEWIRE .025 260CM (WIRE) ×1 IMPLANT
GUIDEWIRE INQWIRE 1.5J.035X260 (WIRE) IMPLANT
INQWIRE 1.5J .035X260CM (WIRE) ×2
KIT HEART LEFT (KITS) ×1 IMPLANT
PACK CARDIAC CATHETERIZATION (CUSTOM PROCEDURE TRAY) ×2 IMPLANT
SHEATH GLIDE SLENDER 4/5FR (SHEATH) ×1 IMPLANT
TRANSDUCER W/STOPCOCK (MISCELLANEOUS) ×2 IMPLANT

## 2021-06-27 NOTE — Progress Notes (Signed)
Notified by CCMD pt had 7bts NSVT. Asymptomatic. Strip saved. Will continue to monitor. Dierdre Highman, RN

## 2021-06-27 NOTE — Interval H&P Note (Signed)
History and Physical Interval Note:  06/27/2021 9:13 AM  Richard [name]  has presented today for surgery, with the diagnosis of heart failure.  The various methods of treatment have been discussed with the patient and family. After consideration of risks, benefits and other options for treatment, the patient has consented to  Procedure(s): RIGHT/LEFT HEART CATH AND CORONARY ANGIOGRAPHY (N/A) and possible coronary angioplasty as a surgical intervention.  The patient's history has been reviewed, patient examined, no change in status, stable for surgery.  I have reviewed the patient's chart and labs.  Questions were answered to the patient's satisfaction.     [name]

## 2021-06-27 NOTE — Progress Notes (Signed)
Mg 1.6 and K 3.7 on am labs. Lucile Crater notified via amion, awaiting orders. Dierdre Highman, RN

## 2021-06-27 NOTE — H&P (View-Only) (Signed)
Advanced Heart Failure Rounding Note  PCP-Cardiologist: None   Subjective:   11/28 PICC placed.  Good diuresis yesterday. Nearly 8L out. Weight down 17 pounds overnight.    Breathing much better. No CP, orthopnea or PND. Co-ox 72%  Having brief bursts of NSVT. K 3.7 Mg 1.6  supped   Objective:   Weight Range: 130.6 kg Body mass index is 32.44 kg/m.   Vital Signs:   Temp:  [98.3 F (36.8 C)] 98.3 F (36.8 C) (11/29 1954) Pulse Rate:  [78] 78 (11/29 1954) Resp:  [19] 19 (11/29 1954) BP: (113)/(88) 113/88 (11/29 1954) SpO2:  [98 %-100 %] 100 % (11/30 0906) Weight:  [130.6 kg] 130.6 kg (11/30 0532) Last BM Date: 06/26/21  Weight change: Filed Weights   06/24/21 1736 06/26/21 0534 06/27/21 0532  Weight: (!) 137 kg (!) 138.4 kg 130.6 kg    Intake/Output:   Intake/Output Summary (Last 24 hours) at 06/27/2021 0908 Last data filed at 06/27/2021 0651 Gross per 24 hour  Intake --  Output 7750 ml  Net -7750 ml       Physical Exam   General:  Well appearing. No resp difficulty HEENT: normal Neck: supple. no JVD. Carotids 2+ bilat; no bruits. No lymphadenopathy or thryomegaly appreciated. Cor: PMI nondisplaced. Regular rate & rhythm. No rubs, gallops or murmurs. Lungs: clear Abdomen: soft, nontender, nondistended. No hepatosplenomegaly. No bruits or masses. Good bowel sounds. Extremities: no cyanosis, clubbing, rash, edema Neuro: alert & orientedx3, cranial nerves grossly intact. moves all 4 extremities w/o difficulty. Affect pleasant   Telemetry   NSR 70-80s + brief NSVT Personally reviewed   Labs    CBC Recent Labs    06/26/21 0430 06/27/21 0214  WBC 5.0 4.7  HGB 13.6 15.1  HCT 40.9 45.3  MCV 90.7 91.3  PLT 231 207    Basic Metabolic Panel Recent Labs    16/10/96 0430 06/27/21 0214 06/27/21 0509  NA 140 138  --   K 3.5 3.7  --   CL 106 98  --   CO2 29 33*  --   GLUCOSE 103* 101*  --   BUN 15 15  --   CREATININE 1.30* 1.31*  --    CALCIUM 8.5* 8.6*  --   MG  --   --  1.6*    Liver Function Tests Recent Labs    06/24/21 1752  AST 24  ALT 18  ALKPHOS 43  BILITOT 2.1*  PROT 6.6  ALBUMIN 3.2*    Recent Labs    06/24/21 1752  LIPASE 21    Cardiac Enzymes No results for input(s): CKTOTAL, CKMB, CKMBINDEX, TROPONINI in the last 72 hours.  BNP: BNP (last 3 results) Recent Labs    07/07/20 1553 03/07/21 1100 06/24/21 1752  BNP 3,549.0* 303.7* 3,319.5*     ProBNP (last 3 results) No results for input(s): PROBNP in the last 8760 hours.   D-Dimer Recent Labs    06/24/21 1752  DDIMER 0.45    Hemoglobin A1C Recent Labs    06/25/21 1615  HGBA1C 6.2*    Fasting Lipid Panel No results for input(s): CHOL, HDL, LDLCALC, TRIG, CHOLHDL, LDLDIRECT in the last 72 hours. Thyroid Function Tests No results for input(s): TSH, T4TOTAL, T3FREE, THYROIDAB in the last 72 hours.  Invalid input(s): FREET3  Other results:   Imaging    No results found.   Medications:     Scheduled Medications:  [MAR Hold] carvedilol  6.25 mg Oral BID WC   [  MAR Hold] Chlorhexidine Gluconate Cloth  6 each Topical Daily   [MAR Hold] potassium chloride  40 mEq Oral Once   [MAR Hold] sacubitril-valsartan  1 tablet Oral BID   [MAR Hold] sodium chloride flush  10-40 mL Intracatheter Q12H   [MAR Hold] sodium chloride flush  3 mL Intravenous Q12H   sodium chloride flush  3 mL Intravenous Q12H   [MAR Hold] spironolactone  25 mg Oral Daily    Infusions:  [MAR Hold] sodium chloride     sodium chloride     sodium chloride     sodium chloride     heparin 2,000 Units/hr (06/26/21 1229)    PRN Medications: [MAR Hold] sodium chloride, sodium chloride, [MAR Hold] acetaminophen, [MAR Hold] ondansetron (ZOFRAN) IV, [MAR Hold] sodium chloride flush, [MAR Hold] sodium chloride flush, sodium chloride flush    Patient Profile   45 yo male with history of chronic systolic HF, obesity, hx LV thrombus and bilateral PE  now admitted for a/c systolic HF  Assessment/Plan  2. Acute on Chronic Systolic Heart Failure - Echo 74/08/14 EF 10-15%. With LV thrombus. 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  - Echo 10/2016 with EF 25-30% laminated LV thrombus. Cannot exclude non-compaction.  - Echo 08/22 EF < 20%, RV okay, trivial MR - Admit 11/27 with a/c HF - progressive HF symptoms over the last few weeks after a viral illness. BNP 3319 (303 in 08/22). HS troponin negative X 2. Evidence of CHF on chest x-ray. - Echo repeated EF < 20%  - Excellent diuresis overnight. Weight down 17 pounds. Co-ox improved to 72% - Plan R/L cath today  - If cath unremarkable will need outpatient CPX to further stratify for advanced therapies  - Continue Entresto 97/103 mg BID.  - Continue spiro 25 mg daily. - Continue carvedilol 6.25 BID for now, may need to hold if co-ox low - Plan to add SGLT2i prior to discharge. Check A1c. 6.7 in 06/2020. - Add back digoxin 0.125 - Renal function stable.  - - QRS narrow, not a candidate for CRT    2. Bilateral PE  - Noted on CTA 11/17  - Eliquis on hold for cath. Resume after - Based on Amplify-EXT data can consider dropping to 2.5 bid but given size, elected not to drop dose  3. Large LV thrombus - Noted on Echo on 05/2016 - Repeat echo - no thrombus.  - Restart Eliquis after cath  4. NSVT - keep K> 4.0 Mg > 2.0 - needs ICD eval.  - consider lifevest at d/c    Length of Stay: 3  Arvilla Meres, MD  06/27/2021, 9:08 AM  Advanced Heart Failure Team Pager [phone] (M-F; 7a - 5p)  Please contact [facility] for night-coverage after hours (5p -7a ) and weekends on [email or web address]

## 2021-06-27 NOTE — TOC CM/SW Note (Signed)
HF TOC CM received referral for [facility]. Contacted [employer] rep, Alvino Chapel will fax progress note, demographics, echo, cardiac cath and order. Possible dc tomorrow to home. Isidoro Donning RN3 CCM, Heart Failure TOC CM [phone]

## 2021-06-27 NOTE — Progress Notes (Signed)
Advanced Heart Failure Rounding Note  PCP-Cardiologist: None   Subjective:   11/28 PICC placed.  Good diuresis yesterday. Nearly 8L out. Weight down 17 pounds overnight.    Breathing much better. No CP, orthopnea or PND. Co-ox 72%  Having brief bursts of NSVT. K 3.7 Mg 1.6  supped   Objective:   Weight Range: 130.6 kg Body mass index is 32.44 kg/m.   Vital Signs:   Temp:  [98.3 F (36.8 C)] 98.3 F (36.8 C) (11/29 1954) Pulse Rate:  [78] 78 (11/29 1954) Resp:  [19] 19 (11/29 1954) BP: (113)/(88) 113/88 (11/29 1954) SpO2:  [98 %-100 %] 100 % (11/30 0906) Weight:  [130.6 kg] 130.6 kg (11/30 0532) Last BM Date: 06/26/21  Weight change: Filed Weights   06/24/21 1736 06/26/21 0534 06/27/21 0532  Weight: (!) 137 kg (!) 138.4 kg 130.6 kg    Intake/Output:   Intake/Output Summary (Last 24 hours) at 06/27/2021 0908 Last data filed at 06/27/2021 0651 Gross per 24 hour  Intake --  Output 7750 ml  Net -7750 ml       Physical Exam   General:  Well appearing. No resp difficulty HEENT: normal Neck: supple. no JVD. Carotids 2+ bilat; no bruits. No lymphadenopathy or thryomegaly appreciated. Cor: PMI nondisplaced. Regular rate & rhythm. No rubs, gallops or murmurs. Lungs: clear Abdomen: soft, nontender, nondistended. No hepatosplenomegaly. No bruits or masses. Good bowel sounds. Extremities: no cyanosis, clubbing, rash, edema Neuro: alert & orientedx3, cranial nerves grossly intact. moves all 4 extremities w/o difficulty. Affect pleasant   Telemetry   NSR 70-80s + brief NSVT Personally reviewed   Labs    CBC Recent Labs    06/26/21 0430 06/27/21 0214  WBC 5.0 4.7  HGB 13.6 15.1  HCT 40.9 45.3  MCV 90.7 91.3  PLT 231 207    Basic Metabolic Panel Recent Labs    16/10/96 0430 06/27/21 0214 06/27/21 0509  NA 140 138  --   K 3.5 3.7  --   CL 106 98  --   CO2 29 33*  --   GLUCOSE 103* 101*  --   BUN 15 15  --   CREATININE 1.30* 1.31*  --    CALCIUM 8.5* 8.6*  --   MG  --   --  1.6*    Liver Function Tests Recent Labs    06/24/21 1752  AST 24  ALT 18  ALKPHOS 43  BILITOT 2.1*  PROT 6.6  ALBUMIN 3.2*    Recent Labs    06/24/21 1752  LIPASE 21    Cardiac Enzymes No results for input(s): CKTOTAL, CKMB, CKMBINDEX, TROPONINI in the last 72 hours.  BNP: BNP (last 3 results) Recent Labs    07/07/20 1553 03/07/21 1100 06/24/21 1752  BNP 3,549.0* 303.7* 3,319.5*     ProBNP (last 3 results) No results for input(s): PROBNP in the last 8760 hours.   D-Dimer Recent Labs    06/24/21 1752  DDIMER 0.45    Hemoglobin A1C Recent Labs    06/25/21 1615  HGBA1C 6.2*    Fasting Lipid Panel No results for input(s): CHOL, HDL, LDLCALC, TRIG, CHOLHDL, LDLDIRECT in the last 72 hours. Thyroid Function Tests No results for input(s): TSH, T4TOTAL, T3FREE, THYROIDAB in the last 72 hours.  Invalid input(s): FREET3  Other results:   Imaging    No results found.   Medications:     Scheduled Medications:  [MAR Hold] carvedilol  6.25 mg Oral BID WC   [  MAR Hold] Chlorhexidine Gluconate Cloth  6 each Topical Daily   [MAR Hold] potassium chloride  40 mEq Oral Once   [MAR Hold] sacubitril-valsartan  1 tablet Oral BID   [MAR Hold] sodium chloride flush  10-40 mL Intracatheter Q12H   [MAR Hold] sodium chloride flush  3 mL Intravenous Q12H   sodium chloride flush  3 mL Intravenous Q12H   [MAR Hold] spironolactone  25 mg Oral Daily    Infusions:  [MAR Hold] sodium chloride     sodium chloride     sodium chloride     sodium chloride     heparin 2,000 Units/hr (06/26/21 1229)    PRN Medications: [MAR Hold] sodium chloride, sodium chloride, [MAR Hold] acetaminophen, [MAR Hold] ondansetron (ZOFRAN) IV, [MAR Hold] sodium chloride flush, [MAR Hold] sodium chloride flush, sodium chloride flush    Patient Profile   46 yo male with history of chronic systolic HF, obesity, hx LV thrombus and bilateral PE  now admitted for a/c systolic HF  Assessment/Plan  2. Acute on Chronic Systolic Heart Failure - Echo 77/41/28 EF 10-15%. With LV thrombus. 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  - Echo 10/2016 with EF 25-30% laminated LV thrombus. Cannot exclude non-compaction.  - Echo 08/22 EF < 20%, RV okay, trivial MR - Admit 11/27 with a/c HF - progressive HF symptoms over the last few weeks after a viral illness. BNP 3319 (303 in 08/22). HS troponin negative X 2. Evidence of CHF on chest x-ray. - Echo repeated EF < 20%  - Excellent diuresis overnight. Weight down 17 pounds. Co-ox improved to 72% - Plan R/L cath today  - If cath unremarkable will need outpatient CPX to further stratify for advanced therapies  - Continue Entresto 97/103 mg BID.  - Continue spiro 25 mg daily. - Continue carvedilol 6.25 BID for now, may need to hold if co-ox low - Plan to add SGLT2i prior to discharge. Check A1c. 6.7 in 06/2020. - Add back digoxin 0.125 - Renal function stable.  - - QRS narrow, not a candidate for CRT    2. Bilateral PE  - Noted on CTA 11/17  - Eliquis on hold for cath. Resume after - Based on Amplify-EXT data can consider dropping to 2.5 bid but given size, elected not to drop dose  3. Large LV thrombus - Noted on Echo on 05/2016 - Repeat echo - no thrombus.  - Restart Eliquis after cath  4. NSVT - keep K> 4.0 Mg > 2.0 - needs ICD eval.  - consider lifevest at d/c    Length of Stay: 3  Arvilla Meres, MD  06/27/2021, 9:08 AM  Advanced Heart Failure Team Pager [phone] (M-F; 7a - 5p)  Please contact [facility] for night-coverage after hours (5p -7a ) and weekends on [email or web address]

## 2021-06-27 NOTE — Progress Notes (Signed)
Notified by nursing for episodic bursts of NSVT, 4-7 beats over last few hours, telemetry reviewed, no sustained events, occasional PVCs noted which were seen previously. K 3.7, Mg 1.6. Cr 1.31 but CrCl wnl @ 153ml/min based on age/weight. He had KCl dose still listed last night at 2030 but nurse confirms this was given as it was a cabinet override due to Pyxis issues. Will order additional KCl x1 now and 4g mag sulfate. Will order continued f/u lytes in AM.

## 2021-06-27 NOTE — Progress Notes (Signed)
Pt has had several short burst of 4-7 bts of NSVT over last few hours, Labs show K 3.7 no Mag ordered. Mg level order placed and drawn with am co-ox. Will await results and notify MD. Pt remains asymptomatic and wt down 17 lbs (weighed x2 to verify). Pt for R&L heart cath this am. Will continue to monitor. Dierdre Highman, RN

## 2021-06-28 ENCOUNTER — Other Ambulatory Visit (HOSPITAL_COMMUNITY): Payer: Self-pay

## 2021-06-28 DIAGNOSIS — I252 Old myocardial infarction: Secondary | ICD-10-CM | POA: Diagnosis not present

## 2021-06-28 DIAGNOSIS — I5023 Acute on chronic systolic (congestive) heart failure: Secondary | ICD-10-CM | POA: Diagnosis not present

## 2021-06-28 DIAGNOSIS — I420 Dilated cardiomyopathy: Secondary | ICD-10-CM | POA: Diagnosis not present

## 2021-06-28 LAB — BASIC METABOLIC PANEL
Anion gap: 9 (ref 5–15)
BUN: 15 mg/dL (ref 6–20)
CO2: 33 mmol/L — ABNORMAL HIGH (ref 22–32)
Calcium: 9 mg/dL (ref 8.9–10.3)
Chloride: 98 mmol/L (ref 98–111)
Creatinine, Ser: 1.24 mg/dL (ref 0.61–1.24)
GFR, Estimated: >60 mL/min (ref >60)
Glucose, Bld: 119 mg/dL — ABNORMAL HIGH (ref 70–99)
Potassium: 3.6 mmol/L (ref 3.5–5.1)
Sodium: 140 mmol/L (ref 135–145)

## 2021-06-28 LAB — CBC
HCT: 45.9 % (ref 39.0–52.0)
Hemoglobin: 14.9 g/dL (ref 13.0–17.0)
MCH: 29.7 pg (ref 26.0–34.0)
MCHC: 32.5 g/dL (ref 30.0–36.0)
MCV: 91.4 fL (ref 80.0–100.0)
Platelets: 237 10*3/uL (ref 150–400)
RBC: 5.02 MIL/uL (ref 4.22–5.81)
RDW: 14.9 % (ref 11.5–15.5)
WBC: 4 10*3/uL (ref 4.0–10.5)
nRBC: 0 % (ref 0.0–0.2)

## 2021-06-28 LAB — MAGNESIUM: Magnesium: 1.9 mg/dL (ref 1.7–2.4)

## 2021-06-28 LAB — COOXEMETRY PANEL
Carboxyhemoglobin: 1.2 % (ref 0.5–1.5)
Methemoglobin: 0.7 % (ref 0.0–1.5)
O2 Saturation: 73.8 %
Total hemoglobin: 16 g/dL (ref 12.0–16.0)

## 2021-06-28 MED ORDER — TORSEMIDE 20 MG PO TABS: 20.0000 mg | ORAL_TABLET | Freq: Every day | ORAL | 5 refills | Status: DC

## 2021-06-28 MED ORDER — MAGNESIUM SULFATE 2 GM/50ML IV SOLN
2.0000 g | Freq: Once | INTRAVENOUS | Status: AC
  Administered 2021-06-28: 2 g via INTRAVENOUS
  Filled 2021-06-28: qty 50

## 2021-06-28 MED ORDER — POTASSIUM CHLORIDE CRYS ER 20 MEQ PO TBCR
40.0000 meq | EXTENDED_RELEASE_TABLET | Freq: Once | ORAL | Status: AC
  Administered 2021-06-28: 40 meq via ORAL
  Filled 2021-06-28: qty 2

## 2021-06-28 MED ORDER — DIGOXIN 125 MCG PO TABS: 0.1250 mg | ORAL_TABLET | Freq: Every day | ORAL | 5 refills | Status: DC

## 2021-06-28 MED ORDER — EMPAGLIFLOZIN 10 MG PO TABS: 10.0000 mg | ORAL_TABLET | Freq: Every day | ORAL | 5 refills | Status: DC

## 2021-06-28 MED ORDER — DAPAGLIFLOZIN PROPANEDIOL 10 MG PO TABS
10.0000 mg | ORAL_TABLET | Freq: Every day | ORAL | Status: DC
  Administered 2021-06-28: 10 mg via ORAL
  Filled 2021-06-28: qty 1

## 2021-06-28 NOTE — Care Management (Addendum)
06-28-21 1206 Case Manager reviewed a consult for PCP needs. Case Manager spoke with the patient regarding PCP and he wants to see if the Case Manager can schedule him an appointment in [facility]. Case Manager did call the [facility] [employer] and the office is closed for lunch. Case Manager will call back at 1330 when they reopen to see if they are accepting Medicaid Patients.    1456 06-28-21 Case Manager was unable to establish an appointment at the patient at the [facility]; however, [facility] will accept the insurance. A PCP appointment was scheduled and the information placed on the AVS. No further needs identified at this time.

## 2021-06-28 NOTE — Discharge Summary (Addendum)
Advanced Heart Failure Team  Discharge Summary   Patient ID: Richard [name] MRN: [id number], DOB/AGE: 03/06/1976 45 y.o. Admit date: 06/24/2021 D/C date:     06/28/2021   Primary Discharge Diagnoses:  Acute on Chronic Biventricular Heart Failure NSVT H/o Bilateral PE H/o LV Thrombus Chronic Anticoagulation Therapy  Type 2DM    Hospital Course:  Richard [name] is a 45 y.o. obese male with bilateral PEs and systolic HF diagnosed in 2017 with EF 10-15%. Subsequent echo 4/18 EF 25-30%   Bedside echo 12/21 EF 20-25%    ECHO 11/20/16 EF 25-30% Probable small laminated LV thrombus   ECHO 05/2016: EF 10-15%. RV dilated LV thrombus    Admitted 11/27 w/ a/c systolic CHF, w/ marked fluid overload and progressive HF symptoms over the last few weeks after a viral illness. COVID and Flu negative. Started on IV Lasix. PICC line placed for co-oximetry and CVP monitoring. Initial Co-ox was ok at 61%.  Echo repeated EF < 20%, RV moderately reduced, no LV thrombus. He responded well to IV diuretics. R/LHC showed severe NICM, EF 15%, normal cors, mildly elevated filling pressures w/ preserved CO and mild pulmonary venous HTN. Diuresis continued until euvolemic and GDMT further optimized. Transitioned from IV Lasix to PO torsemide. Given runs of NSVT, LifeVest was placed at time of discharged. Eliquis continued for secondary prevention of LV thrombus and PE.   Last seen and examined by Dr. Gala Romney on 12/1 and felt stable for discharge home. Post hospital f/u in the [facility] arranged.     Discharge Weight Range: Discharge Wt 278 lb  Discharge Vitals: Blood pressure (!) 93/59, pulse 86, temperature 97.6 F (36.4 C), temperature source Oral, resp. rate 16, height 6\' 7"  (2.007 m), weight 126.3 kg, SpO2 98 %.  Labs: Lab Results  Component Value Date   WBC 4.0 06/28/2021   HGB 14.9 06/28/2021   HCT 45.9 06/28/2021   MCV 91.4 06/28/2021   PLT 237 06/28/2021    Recent Labs  Lab 06/24/21 1752  06/25/21 0500 06/28/21 0137  NA 135   < > 140  K 4.6   < > 3.6  CL 103   < > 98  CO2 22   < > 33*  BUN 13   < > 15  CREATININE 1.25*   < > 1.24  CALCIUM 8.7*   < > 9.0  PROT 6.6  --   --   BILITOT 2.1*  --   --   ALKPHOS 43  --   --   ALT 18  --   --   AST 24  --   --   GLUCOSE 137*   < > 119*   < > = values in this interval not displayed.   Lab Results  Component Value Date   CHOL 81 06/20/2016   HDL 14 (L) 06/20/2016   LDLCALC 54 06/20/2016   TRIG 67 06/20/2016   BNP (last 3 results) Recent Labs    07/07/20 1553 03/07/21 1100 06/24/21 1752  BNP 3,549.0* 303.7* 3,319.5*    ProBNP (last 3 results) No results for input(s): PROBNP in the last 8760 hours.   Diagnostic Studies/Procedures   CARDIAC CATHETERIZATION  Result Date: 06/27/2021 .  The left ventricular ejection fraction is less than 25% by visual estimate. Findings: Ao = 97/71 (85) LV = 97/20 RA = 3 RV = 49/6 PA = 52/25 (35) PCW = 23 Fick cardiac output/index = 6.8/2.5 PVR = 1.8 SVR = 970 Ao sat = 97% PA  sat = 70%, 72% SVC sat = 76% Assessment: 1. Normal coronary arteries 2. Severe NICM EF ~ 15% 3. Mildly elevated filling pressures with preserved cardiac output 4. Mild pulmonary venous HTN Plan/Discussion: Medical therapy. Arvilla Meres, MD 10:02 AM   Discharge Medications   Allergies as of 06/28/2021   No Known Allergies      Medication List     TAKE these medications    carvedilol 6.25 MG tablet Commonly known as: COREG TAKE 1 TABLET BY MOUTH TWICE DAILY WITH A MEAL   digoxin 0.125 MG tablet Commonly known as: LANOXIN Take 1 tablet (0.125 mg total) by mouth daily. Start taking on: June 29, 2021   Eliquis 5 MG Tabs tablet Generic drug: apixaban TAKE 1 TABLET BY MOUTH TWICE DAILY . APPOINTMENT REQUIRED FOR FUTURE REFILLS What changed: See the new instructions.   empagliflozin 10 MG Tabs tablet Commonly known as: Jardiance Take 1 tablet (10 mg total) by mouth daily before breakfast.    Entresto 97-103 MG Generic drug: sacubitril-valsartan Take 1 tablet by mouth twice daily   spironolactone 25 MG tablet Commonly known as: ALDACTONE Take 1 tablet by mouth once daily   torsemide 20 MG tablet Commonly known as: Demadex Take 1 tablet (20 mg total) by mouth daily.               Durable Medical Equipment  (From admission, onward)           Start     Ordered   06/27/21 1703  For home use only DME Vest life vest  Once       Comments: Nonischemic CM, EF <35%, NSVT Duration 3 months   06/27/21 1702            Disposition   The patient will be discharged in stable condition to home.   Follow-up Information     [facility] Follow up.   Specialty: Cardiology Why: 07/09/21 at 3:00 PM at [facility] at [facility]. Parking Garage Code [phone] Contact information: [address] [id number] Wilhemina Bonito [place] [address] [phone]                  Duration of Discharge Encounter: Greater than 35 minutes   Signed, Knute Neu  06/28/2021, 2:36 PM   Cath results reviewed. Meds titrated. Ok for d/c home today. Will need CPX test as outpatient for further stratification for advanced therapies.   Arvilla Meres, MD  4:48 PM

## 2021-06-28 NOTE — Progress Notes (Addendum)
Advanced Heart Failure Rounding Note  PCP-Cardiologist: None   Subjective:    R/LHC yesterday showed severe NICM, EF 15%, normal cors, mildly elevated filling pressures w/ preserved CO and mild pulmonary venous HTN.   PCW 23 on cath. Additional IV Lasix given. Brisk diuresis yesterday. 7.3L in UOP. Wt down 10 lb. Wt down 27 lb overall. CVP 7 (sitting up).   Co-ox 74%   SCr stable, 1.31>>1.24 K 3.6  Mg 1.9   He feels much better. Denies any further dyspnea. LifeVest has been delivered. Eager to go home.    [facility] 11/30    The left ventricular ejection fraction is less than 25% by visual estimate.   Findings:   Ao = 97/71 (85) LV = 97/20 RA = 3 RV = 49/6  PA = 52/25 (35) PCW = 23 Fick cardiac output/index = 6.8/2.5 PVR = 1.8 SVR = 970 Ao sat = 97% PA sat = 70%, 72% SVC sat = 76%   Assessment: 1. Normal coronary arteries 2. Severe NICM EF ~ 15% 3. Mildly elevated filling pressures with preserved cardiac output 4. Mild pulmonary venous HTN   Plan/Discussion:    Medical therapy.      Objective:   Weight Range: 126.3 kg Body mass index is 31.37 kg/m.   Vital Signs:   Temp:  [97.6 F (36.4 C)-98.3 F (36.8 C)] 97.6 F (36.4 C) (12/01 0555) Pulse Rate:  [74-85] 85 (11/30 1958) Resp:  [15-16] 16 (12/01 0555) BP: (93-112)/(59-84) 93/59 (12/01 0555) SpO2:  [97 %-98 %] 98 % (12/01 0555) Weight:  [126.3 kg] 126.3 kg (12/01 0555) Last BM Date: 06/25/21  Weight change: Filed Weights   06/26/21 0534 06/27/21 0532 06/28/21 0555  Weight: (!) 138.4 kg 130.6 kg 126.3 kg    Intake/Output:   Intake/Output Summary (Last 24 hours) at 06/28/2021 1153 Last data filed at 06/28/2021 0500 Gross per 24 hour  Intake 480 ml  Output 6700 ml  Net -6220 ml      Physical Exam   General:  Well appearing. No resp difficulty HEENT: normal Neck: supple. JVD 6 cm. Carotids 2+ bilat; no bruits. No lymphadenopathy or thryomegaly appreciated. Cor: PMI nondisplaced.  Regular rate & rhythm. No rubs, gallops or murmurs. Lungs: clear Abdomen: soft, nontender, nondistended. No hepatosplenomegaly. No bruits or masses. Good bowel sounds. Extremities: no cyanosis, clubbing, rash, edema +RUE PICC  Neuro: alert & orientedx3, cranial nerves grossly intact. moves all 4 extremities w/o difficulty. Affect pleasant   Telemetry   NSR 70-80s, 1 run NSVT (4 beats) Personally reviewed   Labs    CBC Recent Labs    06/27/21 0214 06/27/21 0938 06/27/21 0955 06/28/21 0137  WBC 4.7  --   --  4.0  HGB 15.1   < > 13.3 14.9  HCT 45.3   < > 39.0 45.9  MCV 91.3  --   --  91.4  PLT 207  --   --  237   < > = values in this interval not displayed.   Basic Metabolic Panel Recent Labs    94/76/54 0214 06/27/21 0509 06/27/21 0938 06/27/21 0955 06/28/21 0137  NA 138  --    < > 146* 140  K 3.7  --    < > 2.9* 3.6  CL 98  --   --   --  98  CO2 33*  --   --   --  33*  GLUCOSE 101*  --   --   --  119*  BUN 15  --   --   --  15  CREATININE 1.31*  --   --   --  1.24  CALCIUM 8.6*  --   --   --  9.0  MG  --  1.6*  --   --  1.9   < > = values in this interval not displayed.   Liver Function Tests No results for input(s): AST, ALT, ALKPHOS, BILITOT, PROT, ALBUMIN in the last 72 hours. No results for input(s): LIPASE, AMYLASE in the last 72 hours. Cardiac Enzymes No results for input(s): CKTOTAL, CKMB, CKMBINDEX, TROPONINI in the last 72 hours.  BNP: BNP (last 3 results) Recent Labs    07/07/20 1553 03/07/21 1100 06/24/21 1752  BNP 3,549.0* 303.7* 3,319.5*    ProBNP (last 3 results) No results for input(s): PROBNP in the last 8760 hours.   D-Dimer No results for input(s): DDIMER in the last 72 hours. Hemoglobin A1C Recent Labs    06/25/21 1615  HGBA1C 6.2*   Fasting Lipid Panel No results for input(s): CHOL, HDL, LDLCALC, TRIG, CHOLHDL, LDLDIRECT in the last 72 hours. Thyroid Function Tests No results for input(s): TSH, T4TOTAL, T3FREE, THYROIDAB  in the last 72 hours.  Invalid input(s): FREET3  Other results:   Imaging    No results found.   Medications:     Scheduled Medications:  apixaban  5 mg Oral BID   carvedilol  6.25 mg Oral BID WC   Chlorhexidine Gluconate Cloth  6 each Topical Daily   digoxin  0.125 mg Oral Daily   potassium chloride  40 mEq Oral Once   sacubitril-valsartan  1 tablet Oral BID   sodium chloride flush  10-40 mL Intracatheter Q12H   spironolactone  25 mg Oral Daily    Infusions:  magnesium sulfate bolus IVPB      PRN Medications: acetaminophen, ondansetron (ZOFRAN) IV, sodium chloride flush    Patient Profile   45 yo male with history of chronic systolic HF, obesity, hx LV thrombus and bilateral PE now admitted for a/c systolic HF  Assessment/Plan  2. Acute on Chronic Systolic Heart Failure - Echo 02/72/53 EF 10-15%. With LV thrombus. 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  - Echo 10/2016 with EF 25-30% laminated LV thrombus. Cannot exclude non-compaction.  - Echo 08/22 EF < 20%, RV okay, trivial MR - Admit 11/27 with a/c HF - progressive HF symptoms over the last few weeks after a viral illness. BNP 3319 (303 in 08/22). HS troponin negative X 2. Evidence of CHF on chest x-ray. - Echo repeated EF < 20%  - Excellent diuresis w/ IV Lasix Weight down 27 lb. CVP 7. Co-ox 74% - R/LHC yesterday showed severe NICM, EF 15%, normal cors, mildly elevated filling pressures w/ preserved CO and mild pulmonary venous HTN.  - will need outpatient CPX to further stratify for advanced therapies  - Continue Entresto 97/103 mg BID.  - Continue spiro 25 mg daily. - Continue carvedilol 6.25 BID  - Start Jardiance 10 mg daily. A1c. 6.2  - Continue digoxin 0.125 - Torsemide 20 mg daily  - Renal function stable.  - QRS narrow, not a candidate for CRT - LifeVest delivered     2. Bilateral PE  - Noted on CTA 11/17  - Continue Eliquis 5 mg bid  - Based on Amplify-EXT data can consider dropping to  2.5 bid but given size, elected not to drop dose  3. Large LV thrombus - Noted on  Echo on 05/2016 - Repeat echo - no thrombus.  - on Eliquis   4. NSVT - keep K> 4.0 Mg > 2.0 - needs ICD eval.  - Lifevest has been delivered   Plan d/c today     Length of Stay: [address] Rosita Fire, PA-C  06/28/2021, 11:53 AM  Advanced Heart Failure Team Pager [phone] (M-F; 7a - 5p)  Please contact [facility] for night-coverage after hours (5p -7a ) and weekends on [email or web address]   Patient seen and examined with the above-signed Advanced Practice Provider and/or Housestaff. I personally reviewed laboratory data, imaging studies and relevant notes. I independently examined the patient and formulated the important aspects of the plan. I have edited the note to reflect any of my changes or salient points. I have personally discussed the plan with the patient and/or family.;  Feels much better. No CP, SOB, orthopnea or PND.   General:  Well appearing. No resp difficulty HEENT: normal Neck: supple. no JVD. Carotids 2+ bilat; no bruits. No lymphadenopathy or thryomegaly appreciated. Cor: PMI nondisplaced. Regular rate & rhythm. No rubs, gallops or murmurs. Lungs: clear Abdomen: soft, nontender, nondistended. No hepatosplenomegaly. No bruits or masses. Good bowel sounds. Extremities: no cyanosis, clubbing, rash, edema Neuro: alert & orientedx3, cranial nerves grossly intact. moves all 4 extremities w/o difficulty. Affect pleasant  Cath results reviewed. Meds titrated. Narcissa for d/c home today. Will need CPX test as outpatient for further stratification for advanced therapies.   Glori Bickers, MD  4:48 PM

## 2021-06-28 NOTE — TOC Benefit Eligibility Note (Signed)
Patient Advocate Encounter  Prior Authorization for Jardiance 10 mg has been approved.    PA# [id number] Effective dates: 06/28/2021 through 06/28/2022  Patients co-pay is $4.00.     Roland Earl, CPhT Pharmacy Patient Advocate Specialist [facility] Patient Advocate Team Direct Number: [phone]  Fax: [phone]

## 2021-06-28 NOTE — TOC Benefit Eligibility Note (Signed)
Patient Advocate Encounter   Received notification that prior authorization for Jardiance 10 mg is required.   PA submitted on 06/28/2021 Key [id number] Status is pending       Roland Earl, CPhT Pharmacy Patient Advocate Specialist [facility] Patient Advocate Team Direct Number: [phone]  Fax: [phone]

## 2021-06-29 ENCOUNTER — Telehealth: Payer: Self-pay

## 2021-06-29 NOTE — Telephone Encounter (Signed)
Transition Care Management Follow-up Telephone Call Date of discharge and from where: 06/28/2021 from [facility] How have you been since you were released from the hospital? Pt states that he is feeling well and did not have any concerns regarding his stay. Pt stated that he has paper work for his employer. Informed patient to reach out to cardiology for their recommended next steps on how to proceed.  Any questions or concerns? No  Items Reviewed: Did the pt receive and understand the discharge instructions provided? Yes  Medications obtained and verified? Yes  Other? No  Any new allergies since your discharge? No  Dietary orders reviewed? No Do you have support at home? Yes   Functional Questionnaire: (I = Independent and D = Dependent) ADLs: I  Bathing/Dressing- I  Meal Prep- I  Eating- I  Maintaining continence- I  Transferring/Ambulation- I  Managing Meds- I   Follow up appointments reviewed:  PCP Hospital f/u appt confirmed? No  Pt states he has an appt to establish with PCP but is not sure of the name of office or provider.  Specialist Hospital f/u appt confirmed? Yes  [facility]21 at 3:00pm Are transportation arrangements needed? No  If their condition worsens, is the pt aware to call PCP or go to the Emergency Dept.? Yes Was the patient provided with contact information for the PCP's office or ED? Yes Was to pt encouraged to call back with questions or concerns? Yes

## 2021-07-05 DIAGNOSIS — E559 Vitamin D deficiency, unspecified: Secondary | ICD-10-CM | POA: Diagnosis not present

## 2021-07-05 DIAGNOSIS — R5383 Other fatigue: Secondary | ICD-10-CM | POA: Diagnosis not present

## 2021-07-05 DIAGNOSIS — R0602 Shortness of breath: Secondary | ICD-10-CM | POA: Diagnosis not present

## 2021-07-05 DIAGNOSIS — Z23 Encounter for immunization: Secondary | ICD-10-CM | POA: Diagnosis not present

## 2021-07-05 DIAGNOSIS — Z0000 Encounter for general adult medical examination without abnormal findings: Secondary | ICD-10-CM | POA: Diagnosis not present

## 2021-07-06 ENCOUNTER — Telehealth (HOSPITAL_COMMUNITY): Payer: Self-pay

## 2021-07-06 NOTE — Telephone Encounter (Signed)
Called to confirm/remind patient of their appointment at the [facility] on 07/09/21.   Patient reminded to bring all medications and/or complete list.  Confirmed patient has transportation. Gave directions, instructed to utilize valet parking.  Confirmed appointment prior to ending call.   

## 2021-07-09 ENCOUNTER — Encounter (HOSPITAL_COMMUNITY): Payer: Self-pay

## 2021-07-09 ENCOUNTER — Ambulatory Visit (HOSPITAL_COMMUNITY)
Admission: RE | Admit: 2021-07-09 | Discharge: 2021-07-09 | Disposition: A | Discharge status: Home / Self Care | Payer: Medicaid Other | Source: Ambulatory Visit | Attending: Physician Assistant | Admitting: Physician Assistant

## 2021-07-09 ENCOUNTER — Other Ambulatory Visit: Payer: Self-pay

## 2021-07-09 VITALS — BP 140/100 | HR 83 | Wt 284.6 lb

## 2021-07-09 DIAGNOSIS — I513 Intracardiac thrombosis, not elsewhere classified: Secondary | ICD-10-CM | POA: Diagnosis not present

## 2021-07-09 DIAGNOSIS — I2699 Other pulmonary embolism without acute cor pulmonale: Secondary | ICD-10-CM | POA: Diagnosis not present

## 2021-07-09 DIAGNOSIS — I428 Other cardiomyopathies: Secondary | ICD-10-CM | POA: Insufficient documentation

## 2021-07-09 DIAGNOSIS — I5022 Chronic systolic (congestive) heart failure: Secondary | ICD-10-CM | POA: Insufficient documentation

## 2021-07-09 DIAGNOSIS — R430 Anosmia: Secondary | ICD-10-CM | POA: Diagnosis not present

## 2021-07-09 DIAGNOSIS — Z79899 Other long term (current) drug therapy: Secondary | ICD-10-CM | POA: Insufficient documentation

## 2021-07-09 DIAGNOSIS — Z7901 Long term (current) use of anticoagulants: Secondary | ICD-10-CM | POA: Insufficient documentation

## 2021-07-09 DIAGNOSIS — I472 Ventricular tachycardia, unspecified: Secondary | ICD-10-CM | POA: Diagnosis not present

## 2021-07-09 DIAGNOSIS — I4729 Other ventricular tachycardia: Secondary | ICD-10-CM

## 2021-07-09 DIAGNOSIS — Z7984 Long term (current) use of oral hypoglycemic drugs: Secondary | ICD-10-CM | POA: Diagnosis not present

## 2021-07-09 LAB — BASIC METABOLIC PANEL
Anion gap: 10 (ref 5–15)
BUN: 26 mg/dL — ABNORMAL HIGH (ref 6–20)
CO2: 29 mmol/L (ref 22–32)
Calcium: 9.6 mg/dL (ref 8.9–10.3)
Chloride: 102 mmol/L (ref 98–111)
Creatinine, Ser: 1.21 mg/dL (ref 0.61–1.24)
GFR, Estimated: >60 mL/min (ref >60)
Glucose, Bld: 109 mg/dL — ABNORMAL HIGH (ref 70–99)
Potassium: 4.5 mmol/L (ref 3.5–5.1)
Sodium: 141 mmol/L (ref 135–145)

## 2021-07-09 LAB — BRAIN NATRIURETIC PEPTIDE: B Natriuretic Peptide: 325.8 pg/mL — ABNORMAL HIGH (ref 0.0–100.0)

## 2021-07-09 NOTE — Progress Notes (Addendum)
Advanced Heart Failure Clinic Note   PCP: None Primary HF Cardiologist: Dr Gala Romney  HPI: Richard [name] is a 45 y.o. obese male with bilateral PEs and systolic HF diagnosed in 2017 with EF 10-15% Subsequent echo 4/18 EF 25-30%  Bedside echo 12/21 EF 20-25%   ECHO 11/20/16 EF 25-30% Probable small laminated LV thrombus   ECHO 05/2016: EF 10-15%. RV dilated LV thrombus    Admitted 06/24/21 w/ a/c systolic CHF, w/ marked fluid overload and progressive HF symptoms over period of a few weeks after a viral illness.  PICC line placed for co-oximetry and CVP monitoring. Initial Co-ox was ok at 61%.  Echo repeated EF < 20%, RV moderately reduced, no LV thrombus. He responded well to IV diuretics. R/LHC showed severe NICM, EF 15%, normal cors, mildly elevated filling pressures w/ preserved CO and mild pulmonary venous HTN. Diuresis continued until euvolemic and GDMT further optimized. Transitioned from IV Lasix to PO torsemide 20 mg daily at discharge. Discharge weight 278 lb.  Given runs of NSVT, LifeVest was placed at time of discharged.   He is here today for HF f/u. Feeling much better the last couple of weeks. Dyspnea improved significantly. Home weight stable between 270 - 275 lb. No orthopnea, PND or LE edema. Now has better appetite. Admits his diet needs to improve. Planning to start walking routine and work on weight loss. Denies dizziness.   BP elevated today but has not taken any medications today.   FH: Negative for heart disease SH: No alcohol or drug use.   Review of systems complete and found to be negative unless listed in HPI.    SH:  Social History   Socioeconomic History   Marital status: Single    Spouse name: Not on file   Number of children: Not on file   Years of education: Not on file   Highest education level: Not on file  Occupational History   Not on file  Tobacco Use   Smoking status: Never   Smokeless tobacco: Never  Substance and Sexual Activity    Alcohol use: Yes    Comment: rare   Drug use: No   Sexual activity: Not on file  Other Topics Concern   Not on file  Social History Narrative   Not on file   Social Determinants of Health   Financial Resource Strain: Not on file  Food Insecurity: Not on file  Transportation Needs: Not on file  Physical Activity: Not on file  Stress: Not on file  Social Connections: Not on file  Intimate Partner Violence: Not on file    FH:  Family History  Problem Relation Age of Onset   Other Mother        Neg family history of clotting disorder or heart disease   Clotting disorder Neg Hx    Rheumatologic disease Neg Hx     Past Medical History:  Diagnosis Date   CHF (congestive heart failure) (HCC)     Current Outpatient Medications  Medication Sig Dispense Refill   carvedilol (COREG) 6.25 MG tablet TAKE 1 TABLET BY MOUTH TWICE DAILY WITH A MEAL 180 tablet 3   digoxin (LANOXIN) 0.125 MG tablet Take 1 tablet (0.125 mg total) by mouth daily. 30 tablet 5   ELIQUIS 5 MG TABS tablet TAKE 1 TABLET BY MOUTH TWICE DAILY . APPOINTMENT REQUIRED FOR FUTURE REFILLS 60 tablet 0   empagliflozin (JARDIANCE) 10 MG TABS tablet Take 1 tablet (10 mg total) by mouth daily before  breakfast. 30 tablet 5   ENTRESTO 97-103 MG Take 1 tablet by mouth twice daily 60 tablet 6   spironolactone (ALDACTONE) 25 MG tablet Take 1 tablet by mouth once daily 90 tablet 3   torsemide (DEMADEX) 20 MG tablet Take 1 tablet (20 mg total) by mouth daily. 30 tablet 5   No current facility-administered medications for this encounter.   Vitals:   07/09/21 1456  BP: (!) 140/100  Pulse: 83  SpO2: 97%  Weight: 129.1 kg (284 lb 9.6 oz)     Wt Readings from Last 3 Encounters:  07/09/21 129.1 kg (284 lb 9.6 oz)  06/28/21 126.3 kg (278 lb 8 oz)  03/07/21 (!) 137 kg (302 lb)   PHYSICAL EXAM: General:  Well appearing AAM HEENT: normal Neck: supple. no JVD. Carotids 2+ bilat; no bruits. No lymphadenopathy or thryomegaly  appreciated. Cor: PMI nondisplaced. Regular rate & rhythm. No rubs, gallops or murmurs. Lungs: CTA Abdomen: soft, nontender, nondistended. No hepatosplenomegaly. No bruits or masses. Good bowel sounds. Extremities: no cyanosis, clubbing, rash, edema Neuro: alert & orientedx3, cranial nerves grossly intact. moves all 4 extremities w/o difficulty. Affect pleasant   ASSESSMENT & PLAN:  1. Chronic Systolic Heart Failure/NICM - Echo 06/17/16 EF 10-15%. With LV thrombus. 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  - Echo 10/2016 with EF 25-30% laminated LV thrombus. Cannot exclude non-compaction.  - Echo 08/22 EF < 20%, RV okay, trivial MR - Admit 11/27 with a/c HF - progressive HF symptoms over several weeks after a viral illness. BNP 3319 (303 in 08/22). HS troponin negative X 2. Evidence of CHF on chest x-ray. - Echo repeated EF < 20%, RV moderately reduced  - [facility] 11/22 showed severe NICM, EF 15%, normal cors, mildly elevated filling pressures w/ preserved CO and mild pulmonary venous HTN.  - Currently NYHA II. Much improved from a few weeks ago. Volume okay on exam. - Continue Torsemide 20 mg daily - Continue Entresto 97/103 mg BID.  - Continue spiro 25 mg daily. - Continue carvedilol 6.25 BID  - Continue Jardiance 10 mg daily - Continue digoxin 0.125 - BP elevated today but has not taken any medications. Discussed home monitoring and when to call with elevated readings. Would consider hydralazine/imdur next. - BMET/BNP today - QRS narrow, not a candidate for CRT. Has LifeVest.  - Will arrange for CPX to further stratify for advanced therapies   2. Bilateral PE  - Noted on CTA 11/17  - Continue Eliquis 5 mg bid  - Based on Amplify-EXT data can consider dropping to 2.5 bid but given size, elected not to drop dose   3. Large LV thrombus - Noted on Echo on 05/2016 - Repeat echo 11/22 - no thrombus - on Eliquis    4. NSVT - Has LifeVest. Denies any therapies.  - Requested  interrogation but have not received as of end of clinic day.   Recently established with PCP at [facility] in [facility].   Submitted disability paperwork.  F/u: 4 weeks, after CPX testing   Anylah Scheib N, PA-C  3:05 PM

## 2021-07-09 NOTE — Patient Instructions (Addendum)
Thank you for coming in today   Labs were done today, if any labs are abnormal the clinic will call you  You have been scheduled for a Cardiopulmonary Exercise test, you were give instructions  Your physician recommends that you schedule a follow-up appointment in: you have been scheduled for a follow up appointment  At the Advanced Heart Failure Clinic, you and your health needs are our priority. As part of our continuing mission to provide you with exceptional heart care, we have created designated Provider Care Teams. These Care Teams include your primary Cardiologist (physician) and Advanced Practice Providers (APPs- Physician Assistants and Nurse Practitioners) who all work together to provide you with the care you need, when you need it.   You may see any of the following providers on your designated Care Team at your next follow up: Dr Arvilla Meres Dr Carron Curie, NP Robbie Lis, [place] [facility], [place] Karle Plumber, PharmD   Please be sure to bring in all your medications bottles to every appointment.   If you have any questions or concerns before your next appointment please send [place] a message through [place] or call our office at [phone].    TO LEAVE A MESSAGE FOR THE NURSE SELECT OPTION 2, PLEASE LEAVE A MESSAGE INCLUDING: YOUR NAME DATE OF BIRTH CALL BACK NUMBER REASON FOR CALL**this is important as we prioritize the call backs  YOU WILL RECEIVE A CALL BACK THE SAME DAY AS LONG AS YOU CALL BEFORE 4:00 PM

## 2021-07-09 NOTE — Progress Notes (Signed)
Pt's disability form completed and faxed to [employer] At [phone], pt given copy for his records

## 2021-07-10 ENCOUNTER — Telehealth (HOSPITAL_COMMUNITY): Payer: Self-pay | Admitting: Surgery

## 2021-07-10 MED ORDER — TORSEMIDE 20 MG PO TABS: 20.0000 mg | ORAL_TABLET | ORAL | 5 refills | Status: DC

## 2021-07-10 NOTE — Telephone Encounter (Signed)
Patient called and made aware of results and recommendations per Anna Genre PA.  Medication list updated in [facility]

## 2021-07-10 NOTE — Telephone Encounter (Signed)
-----   Message from Andrey Farmer, [place] sent at 07/09/2021  5:36 PM EST ----- Labs stable other than BUN trending up. Decrease Torsemide to every other day. Can take extra Torsemide PRN for weight gain or worsening dyspnea/edema

## 2021-07-11 DIAGNOSIS — E785 Hyperlipidemia, unspecified: Secondary | ICD-10-CM | POA: Diagnosis not present

## 2021-07-11 DIAGNOSIS — D721 Eosinophilia, unspecified: Secondary | ICD-10-CM | POA: Diagnosis not present

## 2021-07-11 DIAGNOSIS — E119 Type 2 diabetes mellitus without complications: Secondary | ICD-10-CM | POA: Diagnosis not present

## 2021-07-11 DIAGNOSIS — E559 Vitamin D deficiency, unspecified: Secondary | ICD-10-CM | POA: Diagnosis not present

## 2021-07-20 ENCOUNTER — Telehealth (HOSPITAL_COMMUNITY): Payer: Self-pay

## 2021-07-20 NOTE — Telephone Encounter (Signed)
Per Lillia Abed Finch-NP Patient was called and advised to wear his Zoll life vest daily according to his last office visit and his latest Zoll report and he agreed understanding.

## 2021-07-29 DIAGNOSIS — I420 Dilated cardiomyopathy: Secondary | ICD-10-CM | POA: Diagnosis not present

## 2021-07-29 DIAGNOSIS — I252 Old myocardial infarction: Secondary | ICD-10-CM | POA: Diagnosis not present

## 2021-07-31 ENCOUNTER — Other Ambulatory Visit (HOSPITAL_COMMUNITY): Payer: Self-pay | Admitting: *Deleted

## 2021-07-31 ENCOUNTER — Ambulatory Visit (HOSPITAL_COMMUNITY): Payer: Medicaid Other | Attending: Physician Assistant

## 2021-07-31 ENCOUNTER — Other Ambulatory Visit: Payer: Self-pay

## 2021-07-31 DIAGNOSIS — I5022 Chronic systolic (congestive) heart failure: Secondary | ICD-10-CM

## 2021-08-01 NOTE — Progress Notes (Signed)
Advanced Heart Failure Clinic Note   PCP: [facility] in [facility] HF Cardiologist: Dr Gala Romney  HPI: Richard [name] is a 46 y.o. obese male with bilateral PEs and systolic HF diagnosed in 2017 with EF 10-15% Subsequent echo 4/18 EF 25-30%.  Bedside echo 12/21 EF 20-25%   Echo 11/20/16 EF 25-30% Probable small laminated LV thrombus.    Echo 05/2016: EF 10-15%. RV dilated LV thrombus    Admitted 06/24/21 w/ a/c systolic CHF, w/ marked fluid overload and progressive HF symptoms over period of a few weeks after a viral illness.  PICC line placed for co-oximetry and CVP monitoring. Initial Co-ox 61%.  Echo showed EF < 20%, RV moderately reduced, no LV thrombus. He responded well to IV diuretics. R/LHC showed severe NICM, EF 15%, normal cors, mildly elevated filling pressures w/ preserved CO and mild pulmonary venous HTN. Diuresis continued until euvolemic and GDMT further optimized. Transitioned from IV Lasix to PO torsemide 20 mg daily at discharge. Discharge weight 278 lb.  Given runs of NSVT, LifeVest was placed at time of discharge.   Today he returns for HF follow up. Overall feeling fine. Eager to get back to work. He has no SOB with walking on flat ground or with stairs. Denies palpitations, abnormal bleeding, GU symptoms, CP, dizziness, edema, or PND/Orthopnea. Appetite ok. No fever or chills. He does not weigh at home. Taking all medications. Wearing LifeVest.   FH: Negative for heart disease SH: No alcohol or drug use.   Review of systems complete and found to be negative unless listed in HPI.    SH:  Social History   Socioeconomic History   Marital status: Single    Spouse name: Not on file   Number of children: Not on file   Years of education: Not on file   Highest education level: Not on file  Occupational History   Not on file  Tobacco Use   Smoking status: Never   Smokeless tobacco: Never  Substance and Sexual Activity   Alcohol use: Yes    Comment:  rare   Drug use: No   Sexual activity: Not on file  Other Topics Concern   Not on file  Social History Narrative   Not on file   Social Determinants of Health   Financial Resource Strain: Not on file  Food Insecurity: Not on file  Transportation Needs: Not on file  Physical Activity: Not on file  Stress: Not on file  Social Connections: Not on file  Intimate Partner Violence: Not on file   FH:  Family History  Problem Relation Age of Onset   Other Mother        Neg family history of clotting disorder or heart disease   Clotting disorder Neg Hx    Rheumatologic disease Neg Hx    Past Medical History:  Diagnosis Date   CHF (congestive heart failure) (HCC)    Current Outpatient Medications  Medication Sig Dispense Refill   carvedilol (COREG) 6.25 MG tablet TAKE 1 TABLET BY MOUTH TWICE DAILY WITH A MEAL 180 tablet 3   digoxin (LANOXIN) 0.125 MG tablet Take 1 tablet (0.125 mg total) by mouth daily. 30 tablet 5   ELIQUIS 5 MG TABS tablet TAKE 1 TABLET BY MOUTH TWICE DAILY . APPOINTMENT REQUIRED FOR FUTURE REFILLS 60 tablet 0   empagliflozin (JARDIANCE) 10 MG TABS tablet Take 1 tablet (10 mg total) by mouth daily before breakfast. 30 tablet 5   ENTRESTO 97-103 MG Take 1  tablet by mouth twice daily 60 tablet 6   spironolactone (ALDACTONE) 25 MG tablet Take 1 tablet by mouth once daily 90 tablet 3   torsemide (DEMADEX) 20 MG tablet Take 1 tablet (20 mg total) by mouth every other day. May also take as needed for weight gain or swelling. (To equal 20 mg daily) 30 tablet 5   No current facility-administered medications for this encounter.   BP 114/78    Pulse 80    Wt (!) 136.5 kg (301 lb)    SpO2 97%    BMI 33.91 kg/m   Wt Readings from Last 3 Encounters:  08/06/21 (!) 136.5 kg (301 lb)  07/09/21 129.1 kg (284 lb 9.6 oz)  06/28/21 126.3 kg (278 lb 8 oz)   PHYSICAL EXAM: General:  NAD. No resp difficulty, tall HEENT: Normal Neck: Supple. No JVD. Carotids 2+ bilat; no bruits.  No lymphadenopathy or thryomegaly appreciated. Cor: PMI nondisplaced. Regular rate & rhythm. No rubs, gallops or murmurs. Lungs: Clear Abdomen: Soft, nontender, nondistended. No hepatosplenomegaly. No bruits or masses. Good bowel sounds. Extremities: No cyanosis, clubbing, rash, edema Neuro: Alert & oriented x 3, cranial nerves grossly intact. Moves all 4 extremities w/o difficulty. Affect pleasant.  LifeVest interrogation: average HR 70 bpm, average steps 1550 steps/day, no treatments (personally reviewed).  ASSESSMENT & PLAN:  1. Chronic Systolic Heart Failure/NICM - Echo 06/17/16 EF 10-15%. With LV thrombus. 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  - Echo 10/2016 with EF 25-30% laminated LV thrombus. Cannot exclude non-compaction.  - Echo 08/22 EF < 20%, RV okay, trivial MR - Admit 11/27 with a/c HF - progressive HF symptoms over several weeks after a viral illness.  - Echo repeated EF < 20%, RV moderately reduced  - [facility] 11/22 showed severe NICM, EF 15%, normal cors, mildly elevated filling pressures w/ preserved CO and mild pulmonary venous HTN.  - CPX (1/23): Moderate HF impairment, limitations also related to body habitus and deconditioning. - Currently NYHA II. Volume looks good on exam, weight up some (? Weight w/ LifeVest). - Continue torsemide 20 mg every other day. - Continue Entresto 97/103 mg bid.  - Continue spiro 25 mg daily. - Continue carvedilol 6.25 mg bid.  - Continue Jardiance 10 mg daily. - Continue digoxin 0.125 mg daily. - QRS narrow, not a candidate for CRT.  - Has LifeVest.  - Discussed possibility of needing advanced therapies. He says, " I am not interested in a pump or transplant. If it's my time I'm OK." - BMET, BNP and, dig level today.  2. Bilateral PE  - Noted on CTA 11/17.  - Continue Eliquis 5 mg bid.  - Based on Amplify-EXT data can consider dropping to 2.5 bid but given size, elected not to drop dose. - CBC today.   3. Large LV thrombus -  Noted on Echo on 05/2016. - Repeat echo 11/22 - no thrombus - On Eliquis.    4. NSVT - Has LifeVest. Denies any therapies. Denis palpitations. - No issues on interrogation. - Mag 1.9 12/22.  Discussed RTW. He works at a [employer]. I asked him to check with his employer if they are OK with him going back to work with his LifeVest. 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 If this is not acceptable with his employer, will need to stay out until follow up with Dr. Gala Romney.   Follow up with Dr. Gala Romney + echo in 2 months.  Jacklynn Ganong, FNP  2:03 PM

## 2021-08-03 ENCOUNTER — Telehealth (HOSPITAL_COMMUNITY): Payer: Self-pay

## 2021-08-03 NOTE — Telephone Encounter (Signed)
Called to confirm/remind patient of their appointment at the Advanced Heart Failure Clinic on 07/9121.   Patient reminded to bring all medications and/or complete list.  Confirmed patient has transportation. Gave directions, instructed to utilize valet parking.  Confirmed appointment prior to ending call.

## 2021-08-06 ENCOUNTER — Ambulatory Visit (HOSPITAL_COMMUNITY)
Admission: RE | Admit: 2021-08-06 | Discharge: 2021-08-06 | Disposition: A | Discharge status: Home / Self Care | Payer: Medicaid Other | Source: Ambulatory Visit | Attending: Family Medicine | Admitting: Family Medicine

## 2021-08-06 ENCOUNTER — Other Ambulatory Visit: Payer: Self-pay

## 2021-08-06 ENCOUNTER — Encounter (HOSPITAL_COMMUNITY): Payer: Self-pay

## 2021-08-06 VITALS — BP 114/78 | HR 80 | Wt 301.0 lb

## 2021-08-06 DIAGNOSIS — I5022 Chronic systolic (congestive) heart failure: Secondary | ICD-10-CM | POA: Insufficient documentation

## 2021-08-06 DIAGNOSIS — I472 Ventricular tachycardia, unspecified: Secondary | ICD-10-CM | POA: Insufficient documentation

## 2021-08-06 DIAGNOSIS — Z86711 Personal history of pulmonary embolism: Secondary | ICD-10-CM | POA: Insufficient documentation

## 2021-08-06 DIAGNOSIS — I428 Other cardiomyopathies: Secondary | ICD-10-CM | POA: Diagnosis not present

## 2021-08-06 DIAGNOSIS — I513 Intracardiac thrombosis, not elsewhere classified: Secondary | ICD-10-CM | POA: Insufficient documentation

## 2021-08-06 DIAGNOSIS — I110 Hypertensive heart disease with heart failure: Secondary | ICD-10-CM | POA: Insufficient documentation

## 2021-08-06 DIAGNOSIS — Z7901 Long term (current) use of anticoagulants: Secondary | ICD-10-CM | POA: Diagnosis not present

## 2021-08-06 DIAGNOSIS — Z79899 Other long term (current) drug therapy: Secondary | ICD-10-CM | POA: Insufficient documentation

## 2021-08-06 LAB — BASIC METABOLIC PANEL
Anion gap: 3 — ABNORMAL LOW (ref 5–15)
BUN: 16 mg/dL (ref 6–20)
CO2: 31 mmol/L (ref 22–32)
Calcium: 8.8 mg/dL — ABNORMAL LOW (ref 8.9–10.3)
Chloride: 102 mmol/L (ref 98–111)
Creatinine, Ser: 1.25 mg/dL — ABNORMAL HIGH (ref 0.61–1.24)
GFR, Estimated: >60 mL/min (ref >60)
Glucose, Bld: 110 mg/dL — ABNORMAL HIGH (ref 70–99)
Potassium: 4.5 mmol/L (ref 3.5–5.1)
Sodium: 136 mmol/L (ref 135–145)

## 2021-08-06 LAB — CBC
HCT: 46.7 % (ref 39.0–52.0)
Hemoglobin: 15.9 g/dL (ref 13.0–17.0)
MCH: 29.1 pg (ref 26.0–34.0)
MCHC: 34 g/dL (ref 30.0–36.0)
MCV: 85.5 fL (ref 80.0–100.0)
Platelets: 158 10*3/uL (ref 150–400)
RBC: 5.46 MIL/uL (ref 4.22–5.81)
RDW: 14.6 % (ref 11.5–15.5)
WBC: 3.6 10*3/uL — ABNORMAL LOW (ref 4.0–10.5)
nRBC: 0 % (ref 0.0–0.2)

## 2021-08-06 LAB — BRAIN NATRIURETIC PEPTIDE: B Natriuretic Peptide: 196.4 pg/mL — ABNORMAL HIGH (ref 0.0–100.0)

## 2021-08-06 LAB — DIGOXIN LEVEL: Digoxin Level: 0.2 ng/mL — ABNORMAL LOW (ref 0.8–2.0)

## 2021-08-06 NOTE — Addendum Note (Signed)
Encounter addended by: Noralee Space, RN on: 08/06/2021 3:18 PM  Actions taken: Clinical Note Signed

## 2021-08-06 NOTE — Patient Instructions (Signed)
Thank you for coming in today  Labs were done today, if any labs are abnormal the clinic will call you  Your physician recommends that you schedule a follow-up appointment in: end of February with echocardiogram with Dr. Gala Romney  At the Advanced Heart Failure Clinic, you and your health needs are our priority. As part of our continuing mission to provide you with exceptional heart care, we have created designated Provider Care Teams. These Care Teams include your primary Cardiologist (physician) and Advanced Practice Providers (APPs- Physician Assistants and Nurse Practitioners) who all work together to provide you with the care you need, when you need it.   You may see any of the following providers on your designated Care Team at your next follow up: Dr Arvilla Meres Dr Carron Curie, NP Robbie Lis, [place] [facility] [place], [place] Karle Plumber, PharmD   Please be sure to bring in all your medications bottles to every appointment.   If you have any questions or concerns before your next appointment please send [place] a message through [place] or call our office at [phone].    TO LEAVE A MESSAGE FOR THE NURSE SELECT OPTION 2, PLEASE LEAVE A MESSAGE INCLUDING: YOUR NAME DATE OF BIRTH CALL BACK NUMBER REASON FOR CALL**this is important as we prioritize the call backs  YOU WILL RECEIVE A CALL BACK THE SAME DAY AS LONG AS YOU CALL BEFORE 4:00 PM

## 2021-08-06 NOTE — Progress Notes (Signed)
Pt's disability form completed, signed by Dr Gala Romney and faxed along with 07/09/21 OV note to [employer] benefits admin at [phone]. Pt given copy for his records

## 2021-08-27 ENCOUNTER — Other Ambulatory Visit (HOSPITAL_COMMUNITY): Payer: Self-pay | Admitting: Internal Medicine

## 2021-08-29 DIAGNOSIS — I420 Dilated cardiomyopathy: Secondary | ICD-10-CM | POA: Diagnosis not present

## 2021-08-29 DIAGNOSIS — I252 Old myocardial infarction: Secondary | ICD-10-CM | POA: Diagnosis not present

## 2021-08-30 ENCOUNTER — Telehealth (HOSPITAL_COMMUNITY): Payer: Self-pay

## 2021-08-30 NOTE — Telephone Encounter (Signed)
-----   Message from Andrey Farmer, [place] sent at 08/13/2021  2:05 PM EST ----- Reviewed finalized report. Had frequent PVCs during rest and recovery. Should get 3 day zio to better assess PVC burden.

## 2021-08-30 NOTE — Telephone Encounter (Signed)
Patient advised and verbalized understanding. Nurse visit scheduled for zio placement.

## 2021-08-31 ENCOUNTER — Encounter (HOSPITAL_COMMUNITY): Payer: Self-pay

## 2021-08-31 ENCOUNTER — Other Ambulatory Visit (HOSPITAL_COMMUNITY): Payer: Self-pay | Admitting: Pharmacy

## 2021-08-31 ENCOUNTER — Other Ambulatory Visit: Payer: Self-pay

## 2021-08-31 ENCOUNTER — Other Ambulatory Visit (HOSPITAL_COMMUNITY): Payer: Self-pay | Admitting: Internal Medicine

## 2021-08-31 ENCOUNTER — Other Ambulatory Visit (HOSPITAL_COMMUNITY): Payer: Self-pay | Admitting: Assisted Living Facility

## 2021-08-31 ENCOUNTER — Ambulatory Visit (HOSPITAL_COMMUNITY)
Admission: RE | Admit: 2021-08-31 | Discharge: 2021-08-31 | Disposition: A | Discharge status: Home / Self Care | Payer: Medicaid Other | Source: Ambulatory Visit | Attending: Internal Medicine | Admitting: Internal Medicine

## 2021-08-31 ENCOUNTER — Ambulatory Visit (HOSPITAL_COMMUNITY): Admission: RE | Admit: 2021-08-31 | Payer: Medicaid Other | Source: Ambulatory Visit

## 2021-08-31 DIAGNOSIS — I493 Ventricular premature depolarization: Secondary | ICD-10-CM

## 2021-09-20 ENCOUNTER — Ambulatory Visit (HOSPITAL_BASED_OUTPATIENT_CLINIC_OR_DEPARTMENT_OTHER)
Admission: RE | Admit: 2021-09-20 | Discharge: 2021-09-20 | Disposition: A | Discharge status: Home / Self Care | Payer: Medicaid Other | Source: Ambulatory Visit | Attending: Internal Medicine | Admitting: Internal Medicine

## 2021-09-20 ENCOUNTER — Ambulatory Visit (HOSPITAL_COMMUNITY)
Admission: RE | Admit: 2021-09-20 | Discharge: 2021-09-20 | Disposition: A | Discharge status: Home / Self Care | Payer: Medicaid Other | Source: Ambulatory Visit | Attending: Internal Medicine | Admitting: Internal Medicine

## 2021-09-20 ENCOUNTER — Other Ambulatory Visit: Payer: Self-pay

## 2021-09-20 ENCOUNTER — Encounter (HOSPITAL_COMMUNITY): Payer: Self-pay | Admitting: Internal Medicine

## 2021-09-20 VITALS — BP 102/70 | HR 64 | Wt 311.4 lb

## 2021-09-20 DIAGNOSIS — I351 Nonrheumatic aortic (valve) insufficiency: Secondary | ICD-10-CM | POA: Insufficient documentation

## 2021-09-20 DIAGNOSIS — I5022 Chronic systolic (congestive) heart failure: Secondary | ICD-10-CM

## 2021-09-20 DIAGNOSIS — Z6835 Body mass index (BMI) 35.0-35.9, adult: Secondary | ICD-10-CM | POA: Diagnosis not present

## 2021-09-20 DIAGNOSIS — I472 Ventricular tachycardia, unspecified: Secondary | ICD-10-CM | POA: Insufficient documentation

## 2021-09-20 DIAGNOSIS — R0600 Dyspnea, unspecified: Secondary | ICD-10-CM | POA: Insufficient documentation

## 2021-09-20 DIAGNOSIS — I2699 Other pulmonary embolism without acute cor pulmonale: Secondary | ICD-10-CM | POA: Diagnosis not present

## 2021-09-20 DIAGNOSIS — I4729 Other ventricular tachycardia: Secondary | ICD-10-CM | POA: Diagnosis not present

## 2021-09-20 DIAGNOSIS — I272 Pulmonary hypertension, unspecified: Secondary | ICD-10-CM | POA: Insufficient documentation

## 2021-09-20 DIAGNOSIS — Z7901 Long term (current) use of anticoagulants: Secondary | ICD-10-CM | POA: Insufficient documentation

## 2021-09-20 DIAGNOSIS — E669 Obesity, unspecified: Secondary | ICD-10-CM | POA: Diagnosis not present

## 2021-09-20 DIAGNOSIS — Z09 Encounter for follow-up examination after completed treatment for conditions other than malignant neoplasm: Secondary | ICD-10-CM | POA: Insufficient documentation

## 2021-09-20 DIAGNOSIS — Z79899 Other long term (current) drug therapy: Secondary | ICD-10-CM | POA: Insufficient documentation

## 2021-09-20 DIAGNOSIS — Z7984 Long term (current) use of oral hypoglycemic drugs: Secondary | ICD-10-CM | POA: Diagnosis not present

## 2021-09-20 DIAGNOSIS — I428 Other cardiomyopathies: Secondary | ICD-10-CM | POA: Insufficient documentation

## 2021-09-20 DIAGNOSIS — Z86718 Personal history of other venous thrombosis and embolism: Secondary | ICD-10-CM | POA: Diagnosis not present

## 2021-09-20 DIAGNOSIS — I493 Ventricular premature depolarization: Secondary | ICD-10-CM | POA: Diagnosis not present

## 2021-09-20 DIAGNOSIS — R9431 Abnormal electrocardiogram [ECG] [EKG]: Secondary | ICD-10-CM | POA: Insufficient documentation

## 2021-09-20 LAB — CBC
HCT: 43.6 % (ref 39.0–52.0)
Hemoglobin: 15 g/dL (ref 13.0–17.0)
MCH: 29.8 pg (ref 26.0–34.0)
MCHC: 34.4 g/dL (ref 30.0–36.0)
MCV: 86.5 fL (ref 80.0–100.0)
Platelets: 175 10*3/uL (ref 150–400)
RBC: 5.04 MIL/uL (ref 4.22–5.81)
RDW: 17.6 % — ABNORMAL HIGH (ref 11.5–15.5)
WBC: 3.5 10*3/uL — ABNORMAL LOW (ref 4.0–10.5)
nRBC: 0 % (ref 0.0–0.2)

## 2021-09-20 LAB — BASIC METABOLIC PANEL
Anion gap: 10 (ref 5–15)
BUN: 13 mg/dL (ref 6–20)
CO2: 27 mmol/L (ref 22–32)
Calcium: 9 mg/dL (ref 8.9–10.3)
Chloride: 103 mmol/L (ref 98–111)
Creatinine, Ser: 1.12 mg/dL (ref 0.61–1.24)
GFR, Estimated: >60 mL/min (ref >60)
Glucose, Bld: 134 mg/dL — ABNORMAL HIGH (ref 70–99)
Potassium: 4.3 mmol/L (ref 3.5–5.1)
Sodium: 140 mmol/L (ref 135–145)

## 2021-09-20 LAB — ECHOCARDIOGRAM COMPLETE
Area-P 1/2: 6.17 cm2
Calc EF: 10 %
S' Lateral: 6.5 cm
Single Plane A2C EF: 7 %
Single Plane A4C EF: 13 %

## 2021-09-20 LAB — BRAIN NATRIURETIC PEPTIDE: B Natriuretic Peptide: 78.7 pg/mL (ref 0.0–100.0)

## 2021-09-20 NOTE — Progress Notes (Signed)
Advanced Heart Failure Clinic Note   PCP: [facility] in [facility] HF Cardiologist: Dr Haroldine Laws  HPI: Richard [name] is a 46 y.o. obese male with bilateral PEs and systolic HF diagnosed in 0000000 with EF 10-15% Subsequent echo 4/18 EF 25-30%.  Bedside echo 12/21 EF 20-25%   Echo 11/20/16 EF 25-30% Probable small laminated LV thrombus.    Echo 05/2016: EF 10-15%. RV dilated LV thrombus    Admitted 0000000 w/ a/c systolic CHF, w/ marked fluid overload and progressive HF symptoms over period of a few weeks after a viral illness.  PICC line placed for co-oximetry and CVP monitoring. Initial Co-ox 61%.  Echo showed EF < 20%, RV moderately reduced, no LV thrombus. He responded well to IV diuretics. R/LHC showed severe NICM, EF 15%, normal cors, mildly elevated filling pressures w/ preserved CO and mild pulmonary venous HTN. Diuresis continued until euvolemic and GDMT further optimized. Transitioned from IV Lasix to PO torsemide 20 mg daily at discharge. Discharge weight 278 lb.  Given runs of NSVT, LifeVest was placed at time of discharge.   Today he returns for HF follow up. Feels great. Doing anything he wants but not exercising much. No CP or SOB. Compliant with meds.  Echo today 09/20/21: LVEF 20% RV moderately reduced  No LV thrombus Personally reviewed   FH: Negative for heart disease SH: No alcohol or drug use.   Review of systems complete and found to be negative unless listed in HPI.    SH:  Social History   Socioeconomic History   Marital status: Single    Spouse name: Not on file   Number of children: Not on file   Years of education: Not on file   Highest education level: Not on file  Occupational History   Not on file  Tobacco Use   Smoking status: Never   Smokeless tobacco: Never  Substance and Sexual Activity   Alcohol use: Yes    Comment: rare   Drug use: No   Sexual activity: Not on file  Other Topics Concern   Not on file  Social History Narrative    Not on file   Social Determinants of Health   Financial Resource Strain: Not on file  Food Insecurity: Not on file  Transportation Needs: Not on file  Physical Activity: Not on file  Stress: Not on file  Social Connections: Not on file  Intimate Partner Violence: Not on file   FH:  Family History  Problem Relation Age of Onset   Other Mother        Neg family history of clotting disorder or heart disease   Clotting disorder Neg Hx    Rheumatologic disease Neg Hx    Past Medical History:  Diagnosis Date   CHF (congestive heart failure) (HCC)    Current Outpatient Medications  Medication Sig Dispense Refill   carvedilol (COREG) 6.25 MG tablet TAKE 1 TABLET BY MOUTH TWICE DAILY WITH A MEAL 180 tablet 3   digoxin (LANOXIN) 0.125 MG tablet Take 1 tablet (0.125 mg total) by mouth daily. 30 tablet 5   ELIQUIS 5 MG TABS tablet TAKE 1 TABLET BY MOUTH TWICE DAILY APPOINTMENT  REQUIRED  FOR  FUTURE  REFILLS 60 tablet 0   empagliflozin (JARDIANCE) 10 MG TABS tablet Take 1 tablet (10 mg total) by mouth daily before breakfast. 30 tablet 5   ENTRESTO 97-103 MG Take 1 tablet by mouth twice daily 60 tablet 6   spironolactone (ALDACTONE) 25 MG  tablet Take 1 tablet by mouth once daily 90 tablet 3   torsemide (DEMADEX) 20 MG tablet Take 1 tablet (20 mg total) by mouth every other day. May also take as needed for weight gain or swelling. (To equal 20 mg daily) 30 tablet 5   No current facility-administered medications for this encounter.   BP 102/70    Pulse 64    Wt (!) 141.3 kg (311 lb 6.4 oz)    SpO2 96%    BMI 35.08 kg/m   Wt Readings from Last 3 Encounters:  09/20/21 (!) 141.3 kg (311 lb 6.4 oz)  08/06/21 (!) 136.5 kg (301 lb)  07/09/21 129.1 kg (284 lb 9.6 oz)   PHYSICAL EXAM: General:  Well appearing. No resp difficulty HEENT: normal Neck: supple. no JVD. Carotids 2+ bilat; no bruits. No lymphadenopathy or thryomegaly appreciated. Cor: PMI nondisplaced. Regular rate & rhythm. No  rubs, gallops or murmurs. Lungs: clear Abdomen: soft, nontender, nondistended. No hepatosplenomegaly. No bruits or masses. Good bowel sounds. Extremities: no cyanosis, clubbing, rash, edema Neuro: alert & orientedx3, cranial nerves grossly intact. moves all 4 extremities w/o difficulty. Affect pleasant   LifeVest interrogation: average HR 70 bpm, average steps 1550 steps/day, no treatments (personally reviewed).  ASSESSMENT & PLAN:  1. Chronic Systolic Heart Failure/NICM - Echo 06/17/16 EF 10-15%. With LV thrombus. 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  - Echo 10/2016 with EF 25-30% laminated LV thrombus. Cannot exclude non-compaction.  - Echo 08/22 EF < 20%, RV okay, trivial MR - Admit 11/27 with a/c HF - progressive HF symptoms over several weeks after a viral illness.  - Echo repeated EF < 20%, RV moderately reduced  - [facility] 11/22 showed severe NICM, EF 15%, normal cors, mildly elevated filling pressures w/ preserved CO and mild pulmonary venous HTN.  - CPX (1/23): Moderate HF impairment, limitations also related to body habitus and deconditioning. - Stable NYHA I despite persistent LV function - Continue torsemide 20 mg every other day. - Continue Entresto 97/103 mg bid.  - Continue spiro 25 mg daily. - Continue carvedilol 6.25 mg bid.  - Continue Jardiance 10 mg daily. - Continue digoxin 0.125 mg daily. - QRS narrow, not a candidate for CRT.  - He is not interested in Ronda, ICD or advanced therapies. Says if it is his time, it is his time. - Labs today  2. Bilateral PE  - Noted on CTA 11/17.  - Continue Eliquis 5mg  bid - Based on Amplify-EXT data can consider dropping to 2.5 bid but given size, elected not to drop dose. - Labs today   3. Large LV thrombus - Noted on Echo on 05/2016. - Repeat echo 11/22 & 2/23 - no thrombus - On Eliquis.    4. NSVT - Not interested in ICD - check labs   Glori Bickers, MD  10:16 AM

## 2021-09-20 NOTE — Progress Notes (Signed)
°  Echocardiogram 2D Echocardiogram has been performed.  Janalyn Harder 09/20/2021, 8:48 AM

## 2021-09-20 NOTE — Patient Instructions (Signed)
Thank you for your visit today.  There has been no changes to your medications.  PLEASE MAIL YOUR LIFE VEST IN THE BOX BACK TO COMPANY.  Your physician recommends that you schedule a follow-up appointment in: 6 months (August 20023)  ** please call the office in June to schedule your follow up appointment**  If you have any questions or concerns before your next appointment please send [place] a message through [place] or call our office at [phone].    TO LEAVE A MESSAGE FOR THE NURSE SELECT OPTION 2, PLEASE LEAVE A MESSAGE INCLUDING: YOUR NAME DATE OF BIRTH CALL BACK NUMBER REASON FOR CALL**this is important as we prioritize the call backs  YOU WILL RECEIVE A CALL BACK THE SAME DAY AS LONG AS YOU CALL BEFORE 4:00 PM  At the [facility], you and your health needs are our priority. As part of our continuing mission to provide you with exceptional heart care, we have created designated Provider Care Teams. These Care Teams include your primary Cardiologist (physician) and Advanced Practice Providers (APPs- Physician Assistants and Nurse Practitioners) who all work together to provide you with the care you need, when you need it.   You may see any of the following providers on your designated Care Team at your next follow up: Dr Arvilla Meres Dr Carron Curie, NP Robbie Lis, [place] [facility] [place], [place] Karle Plumber, PharmD   Please be sure to bring in all your medications bottles to every appointment.

## 2021-09-21 ENCOUNTER — Telehealth (HOSPITAL_COMMUNITY): Payer: Self-pay | Admitting: Cardiology

## 2021-09-21 NOTE — Telephone Encounter (Signed)
Life vest d/c order called into [facility] at [phone] per Dr Gala Romney

## 2021-09-24 ENCOUNTER — Encounter (HOSPITAL_COMMUNITY): Payer: Self-pay | Admitting: *Deleted

## 2021-09-26 ENCOUNTER — Other Ambulatory Visit (HOSPITAL_COMMUNITY): Payer: Self-pay | Admitting: Internal Medicine

## 2021-09-26 ENCOUNTER — Telehealth (HOSPITAL_COMMUNITY): Payer: Self-pay

## 2021-09-26 MED ORDER — MEXILETINE HCL 200 MG PO CAPS: 200.0000 mg | ORAL_CAPSULE | Freq: Two times a day (BID) | ORAL | 11 refills | Status: DC

## 2021-09-26 NOTE — Telephone Encounter (Addendum)
Pt aware, agreeable, and verbalized understanding. Not willing to do sleep study at the minute as he has just returned to work. He would like to know when it can be done so he can see if it works with his work schedule.  ?Script sent to pharmacy ? ? ?----- Message from Jacklynn Ganong, [place] sent at 09/19/2021  8:44 AM EST ----- ?Called patient to discuss starting mexilitene and arrange sleep study. Voicemail not set up, unable to leave message. ?

## 2021-11-02 ENCOUNTER — Other Ambulatory Visit (HOSPITAL_COMMUNITY): Payer: Self-pay | Admitting: Internal Medicine

## 2022-01-14 ENCOUNTER — Other Ambulatory Visit (HOSPITAL_COMMUNITY): Payer: Self-pay | Admitting: Internal Medicine

## 2022-03-11 ENCOUNTER — Other Ambulatory Visit: Payer: Self-pay | Admitting: Cardiology

## 2022-05-02 ENCOUNTER — Other Ambulatory Visit: Payer: Self-pay | Admitting: Cardiology

## 2022-07-29 ENCOUNTER — Other Ambulatory Visit: Payer: Self-pay | Admitting: Cardiology

## 2022-10-09 ENCOUNTER — Other Ambulatory Visit (HOSPITAL_COMMUNITY): Payer: Self-pay | Admitting: Family Medicine

## 2022-10-09 ENCOUNTER — Other Ambulatory Visit: Payer: Self-pay | Admitting: Internal Medicine

## 2022-12-01 ENCOUNTER — Other Ambulatory Visit (HOSPITAL_COMMUNITY): Payer: Self-pay | Admitting: Family Medicine

## 2022-12-01 ENCOUNTER — Other Ambulatory Visit (HOSPITAL_COMMUNITY): Payer: Self-pay | Admitting: Internal Medicine

## 2023-01-27 ENCOUNTER — Other Ambulatory Visit: Payer: Self-pay | Admitting: Internal Medicine

## 2023-01-29 ENCOUNTER — Other Ambulatory Visit: Payer: Self-pay | Admitting: Internal Medicine

## 2023-03-03 ENCOUNTER — Other Ambulatory Visit (HOSPITAL_COMMUNITY): Payer: Self-pay | Admitting: Family Medicine

## 2023-03-03 ENCOUNTER — Other Ambulatory Visit: Payer: Self-pay | Admitting: Internal Medicine

## 2023-03-25 ENCOUNTER — Other Ambulatory Visit: Payer: Self-pay | Admitting: Internal Medicine

## 2023-03-25 ENCOUNTER — Other Ambulatory Visit (HOSPITAL_COMMUNITY): Payer: Self-pay | Admitting: Family Medicine

## 2023-04-28 ENCOUNTER — Other Ambulatory Visit: Payer: Self-pay | Admitting: Internal Medicine

## 2023-05-01 ENCOUNTER — Other Ambulatory Visit: Payer: Self-pay

## 2023-05-01 ENCOUNTER — Emergency Department (HOSPITAL_COMMUNITY)
Admission: EM | Admit: 2023-05-01 | Discharge: 2023-05-02 | Disposition: A | Discharge status: Home / Self Care | Payer: Medicaid Other | Attending: Emergency Medicine | Admitting: Emergency Medicine

## 2023-05-01 DIAGNOSIS — R112 Nausea with vomiting, unspecified: Secondary | ICD-10-CM | POA: Diagnosis not present

## 2023-05-01 DIAGNOSIS — Z7901 Long term (current) use of anticoagulants: Secondary | ICD-10-CM | POA: Diagnosis not present

## 2023-05-01 DIAGNOSIS — I110 Hypertensive heart disease with heart failure: Secondary | ICD-10-CM | POA: Diagnosis not present

## 2023-05-01 DIAGNOSIS — I5022 Chronic systolic (congestive) heart failure: Secondary | ICD-10-CM | POA: Diagnosis not present

## 2023-05-01 DIAGNOSIS — I517 Cardiomegaly: Secondary | ICD-10-CM | POA: Diagnosis not present

## 2023-05-01 DIAGNOSIS — R0989 Other specified symptoms and signs involving the circulatory and respiratory systems: Secondary | ICD-10-CM | POA: Diagnosis not present

## 2023-05-01 DIAGNOSIS — I509 Heart failure, unspecified: Secondary | ICD-10-CM | POA: Diagnosis not present

## 2023-05-01 DIAGNOSIS — R0602 Shortness of breath: Secondary | ICD-10-CM | POA: Diagnosis not present

## 2023-05-01 NOTE — ED Triage Notes (Signed)
Patient reports intermittent emesis with fatigue and generalized weakness onset last week . No fever or chills .

## 2023-05-02 ENCOUNTER — Emergency Department (HOSPITAL_COMMUNITY): Payer: Medicaid Other

## 2023-05-02 ENCOUNTER — Encounter (HOSPITAL_COMMUNITY): Payer: Self-pay | Admitting: Emergency Medicine

## 2023-05-02 DIAGNOSIS — I517 Cardiomegaly: Secondary | ICD-10-CM | POA: Diagnosis not present

## 2023-05-02 DIAGNOSIS — R0602 Shortness of breath: Secondary | ICD-10-CM | POA: Diagnosis not present

## 2023-05-02 DIAGNOSIS — R0989 Other specified symptoms and signs involving the circulatory and respiratory systems: Secondary | ICD-10-CM | POA: Diagnosis not present

## 2023-05-02 LAB — CBC WITH DIFFERENTIAL/PLATELET
Abs Immature Granulocytes: 0.03 10*3/uL (ref 0.00–0.07)
Basophils Absolute: 0 10*3/uL (ref 0.0–0.1)
Basophils Relative: 1 %
Eosinophils Absolute: 0.2 10*3/uL (ref 0.0–0.5)
Eosinophils Relative: 4 %
HCT: 45 % (ref 39.0–52.0)
Hemoglobin: 14.7 g/dL (ref 13.0–17.0)
Immature Granulocytes: 1 %
Lymphocytes Relative: 22 %
Lymphs Abs: 1.1 10*3/uL (ref 0.7–4.0)
MCH: 29.8 pg (ref 26.0–34.0)
MCHC: 32.7 g/dL (ref 30.0–36.0)
MCV: 91.1 fL (ref 80.0–100.0)
Monocytes Absolute: 0.5 10*3/uL (ref 0.1–1.0)
Monocytes Relative: 11 %
Neutro Abs: 3.1 10*3/uL (ref 1.7–7.7)
Neutrophils Relative %: 61 %
Platelets: 236 10*3/uL (ref 150–400)
RBC: 4.94 MIL/uL (ref 4.22–5.81)
RDW: 16.5 % — ABNORMAL HIGH (ref 11.5–15.5)
WBC: 4.9 10*3/uL (ref 4.0–10.5)
nRBC: 0 % (ref 0.0–0.2)

## 2023-05-02 LAB — COMPREHENSIVE METABOLIC PANEL
ALT: 15 U/L (ref 0–44)
AST: 24 U/L (ref 15–41)
Albumin: 3.4 g/dL — ABNORMAL LOW (ref 3.5–5.0)
Alkaline Phosphatase: 54 U/L (ref 38–126)
Anion gap: 12 (ref 5–15)
BUN: 16 mg/dL (ref 6–20)
CO2: 23 mmol/L (ref 22–32)
Calcium: 8.6 mg/dL — ABNORMAL LOW (ref 8.9–10.3)
Chloride: 102 mmol/L (ref 98–111)
Creatinine, Ser: 1.36 mg/dL — ABNORMAL HIGH (ref 0.61–1.24)
GFR, Estimated: >60 mL/min (ref >60)
Glucose, Bld: 131 mg/dL — ABNORMAL HIGH (ref 70–99)
Potassium: 4.1 mmol/L (ref 3.5–5.1)
Sodium: 137 mmol/L (ref 135–145)
Total Bilirubin: 1.8 mg/dL — ABNORMAL HIGH (ref 0.3–1.2)
Total Protein: 6.5 g/dL (ref 6.5–8.1)

## 2023-05-02 LAB — LIPASE, BLOOD: Lipase: 27 U/L (ref 11–51)

## 2023-05-02 NOTE — ED Provider Notes (Signed)
[place] EMERGENCY DEPARTMENT AT [facility] Provider Note   CSN: [id number] Arrival date & time: 05/01/23  2340     History  Chief Complaint  Patient presents with   Emesis Richard [name]     Bohdan Macho is a 47 y.o. male.  47 yo M with a cc of nausea and vomiting.  Has been going on for about a month.  Seems to be related to the food that he eats.  He sometimes gets epigastric discomfort with it as well.  Denies fevers or chills.  Denies persistence.  He has a history of heart failure and thinks that sometimes he has nausea and vomiting with that.  He is not sure if he is fluid overloaded or not.        Home Medications Prior to Admission medications   Medication Sig Start Date End Date Taking? Authorizing Provider  carvedilol (COREG) 6.25 MG tablet TAKE 1 TABLET BY MOUTH TWICE DAILY WITH MEALS . APPOINTMENT REQUIRED FOR FUTURE REFILLS 03/04/23   Bensimhon, Bevelyn Buckles, MD  digoxin (LANOXIN) 0.125 MG tablet TAKE 1 TABLET BY MOUTH ONCE DAILY APPOINTMENT  REQUIRED  FOR  FUTURE  REFILLS 04/28/23   Bensimhon, Bevelyn Buckles, MD  ELIQUIS 5 MG TABS tablet TAKE 1 TABLET BY MOUTH TWICE DAILY . APPOINTMENT REQUIRED FOR FUTURE REFILLS 03/04/23   Bensimhon, Bevelyn Buckles, MD  ENTRESTO 97-103 MG TAKE 1 TABLET BY MOUTH TWICE DAILY APPOINTMENT  REQUIRED  FOR  FUTURE  REFILLS 03/27/23   Bensimhon, Bevelyn Buckles, MD  JARDIANCE 10 MG TABS tablet TAKE 1 TABLET BY MOUTH BEFORE BREAKFAST APPOINTMENT  REQUIRED  FOR  FUTURE  REFILLS 04/28/23   Bensimhon, Bevelyn Buckles, MD  mexiletine (MEXITIL) 200 MG capsule TAKE 1 CAPSULE BY MOUTH TWICE DAILY MUST  MAKE  APPOINTMENT  FOR  FUTURE  REFILLS 03/27/23   Bensimhon, Bevelyn Buckles, MD  spironolactone (ALDACTONE) 25 MG tablet Take 1 tablet by mouth once daily 04/11/21   Bensimhon, Bevelyn Buckles, MD  torsemide (DEMADEX) 20 MG tablet TAKE 1 TABLET BY MOUTH ONCE DAILY . APPOINTMENT REQUIRED FOR FUTURE REFILLS 04/28/23   Bensimhon, Bevelyn Buckles, MD      Allergies    Patient has no known allergies.     Review of Systems   Review of Systems  Physical Exam Updated Vital Signs BP (!) 116/90   Pulse 83   Temp 97.8 F (36.6 C) (Oral)   Resp 14   SpO2 100%  Physical Exam Vitals and nursing note reviewed.  Constitutional:      Appearance: He is well-developed.  HENT:     Head: Normocephalic and atraumatic.  Eyes:     Pupils: Pupils are equal, round, and reactive to light.  Neck:     Vascular: No JVD.  Cardiovascular:     Rate and Rhythm: Normal rate and regular rhythm.     Heart sounds: No murmur heard.    No friction rub. No gallop.  Pulmonary:     Effort: No respiratory distress.     Breath sounds: No wheezing.  Abdominal:     General: There is no distension.     Tenderness: There is no abdominal tenderness. There is no guarding or rebound.     Comments: Exam limited due to body habitus, no obvious pain in the right upper quadrant.  Negative Murphy sign.  Benign abdominal exam.  Musculoskeletal:        General: Normal range of motion.     Cervical back: Normal  range of motion and neck supple.  Skin:    Coloration: Skin is not pale.     Findings: No rash.  Neurological:     Mental Status: He is alert and oriented to person, place, and time.  Psychiatric:        Behavior: Behavior normal.     ED Results / Procedures / Treatments   Labs (all labs ordered are listed, but only abnormal results are displayed) Labs Reviewed  CBC WITH DIFFERENTIAL/PLATELET - Abnormal; Notable for the following components:      Result Value   RDW 16.5 (*)    All other components within normal limits  COMPREHENSIVE METABOLIC PANEL - Abnormal; Notable for the following components:   Glucose, Bld 131 (*)    Creatinine, Ser 1.36 (*)    Calcium 8.6 (*)    Albumin 3.4 (*)    Total Bilirubin 1.8 (*)    All other components within normal limits  LIPASE, BLOOD  URINALYSIS, ROUTINE W REFLEX MICROSCOPIC    EKG EKG Interpretation Date/Time:  Friday May 02 2023 00:57:26  EDT Ventricular Rate:  91 PR Interval:  220 QRS Duration:  114 QT Interval:  386 QTC Calculation: 475 R Axis:   129  Text Interpretation: Sinus rhythm Ventricular premature complex Prolonged PR interval Left atrial enlargement Borderline intraventricular conduction delay Low voltage with right axis deviation Nonspecific repol abnormality, diffuse leads Baseline wander TECHNICALLY DIFFICULT Confirmed by Melene Plan [phone]) on 05/02/2023 1:25:33 AM  Radiology DG Chest Port 1 View  Result Date: 05/02/2023 CLINICAL DATA:  Shortness of breath EXAM: PORTABLE CHEST 1 VIEW COMPARISON:  06/24/2021 FINDINGS: Cardiac shadow is enlarged but stable. Lungs are well aerated bilaterally. Vascular congestion is noted without interstitial edema. No bony abnormality is seen. IMPRESSION: Vascular congestion without significant edema. Electronically Signed   By: Alcide Clever M.D.   On: 05/02/2023 01:25    Procedures Procedures    Medications Ordered in ED Medications - No data to display  ED Course/ Medical Decision Making/ A&P                                 Medical Decision Making Amount and/or Complexity of Data Reviewed Labs: ordered. Radiology: ordered. ECG/medicine tests: ordered.   47 yo M with a chief complaints of episodic nausea and vomiting after eating.  By history this sounds like reflux.  He is worried that maybe this is due to fluid overload though clinically is not fluid overloaded.  Is clear lung sounds for me.  Will obtain a chest x-ray.  Blood work.  Patient with no LFT elevation.  Will obtain a lipase.  No leukocytosis.  Seems unlikely to be acute cholecystitis by history and physical.  Trial of H2 blockers.  PCP and cardiology follow-up.   Chest ray independently interpreted by me with worsening cardiac silhouette compared to prior couple years ago.  No obvious pulmonary edema. On my record review the patient had not been seen by cardiology in over a year.  At that time he was  thought to need a LifeVest.  Not currently wearing one.  Will give cardiology referral.  1:34 AM:  I have discussed the diagnosis/risks/treatment options with the patient.  Evaluation and diagnostic testing in the emergency department does not suggest an emergent condition requiring admission or immediate intervention beyond what has been performed at this time.  They will follow up with PCP,cards. We also discussed returning  to the ED immediately if new or worsening sx occur. We discussed the sx which are most concerning (e.g., sudden worsening pain, fever, inability to tolerate by mouth) that necessitate immediate return. Medications administered to the patient during their visit and any new prescriptions provided to the patient are listed below.  Medications given during this visit Medications - No data to display   The patient appears reasonably screen and/or stabilized for discharge and I doubt any other medical condition or other [facility] requiring further screening, evaluation, or treatment in the ED at this time prior to discharge.          Final Clinical Impression(s) / ED Diagnoses Final diagnoses:  Nausea and vomiting, unspecified vomiting type  Chronic systolic CHF (congestive heart failure) (HCC)    Rx / DC Orders ED Discharge Orders          Ordered    Ambulatory referral to Cardiology       Comments: If you have not heard from the Cardiology office within the next 72 hours please call [phone].   05/02/23 0132              Melene Plan, DO 05/02/23 0134

## 2023-05-02 NOTE — Discharge Instructions (Signed)
Try pepcid or tagamet up to twice a day.  Try to avoid things that may make this worse, most commonly these are spicy foods tomato based products fatty foods chocolate and peppermint.  Alcohol and tobacco can also make this worse.  Return to the emergency department for sudden worsening pain fever or inability to eat or drink.  Follow up with your cardiologist in the office.

## 2023-05-05 ENCOUNTER — Telehealth (HOSPITAL_COMMUNITY): Payer: Self-pay | Admitting: Cardiology

## 2023-05-05 DIAGNOSIS — I5022 Chronic systolic (congestive) heart failure: Secondary | ICD-10-CM

## 2023-05-05 MED ORDER — TORSEMIDE 20 MG PO TABS: 20.0000 mg | ORAL_TABLET | Freq: Every day | ORAL | 0 refills | Status: DC

## 2023-05-05 MED ORDER — POTASSIUM CHLORIDE CRYS ER 20 MEQ PO TBCR: 20.0000 meq | EXTENDED_RELEASE_TABLET | Freq: Every day | ORAL | 0 refills | Status: DC

## 2023-05-05 NOTE — Telephone Encounter (Signed)
Pt aware Meds ordered Next available echo 11/6 Swing appt 10/9 at 130

## 2023-05-05 NOTE — Telephone Encounter (Signed)
Patient called to report increase in SOB, N/V x 1 month  Reports swelling in chest area/fluid in chest  Reports increase in spitting up with mucus/phlegm   Reports most recent weight 319  Torsemide every other day -despite order for 20 daily  Reports seen in ER and nothing was done  Currently scheduled for FU on 10/18  Girlfriend feels he needs to be seen sooner  Please advise

## 2023-05-07 ENCOUNTER — Ambulatory Visit (HOSPITAL_COMMUNITY)
Admission: RE | Admit: 2023-05-07 | Discharge: 2023-05-07 | Disposition: A | Discharge status: Home / Self Care | Payer: Medicaid Other | Source: Ambulatory Visit | Attending: Family Medicine | Admitting: Family Medicine

## 2023-05-07 ENCOUNTER — Encounter (HOSPITAL_COMMUNITY): Payer: Self-pay | Admitting: Family Medicine

## 2023-05-07 ENCOUNTER — Encounter (HOSPITAL_COMMUNITY): Payer: Self-pay

## 2023-05-07 VITALS — BP 100/60 | HR 75 | Wt 316.4 lb

## 2023-05-07 DIAGNOSIS — Z7901 Long term (current) use of anticoagulants: Secondary | ICD-10-CM | POA: Insufficient documentation

## 2023-05-07 DIAGNOSIS — I513 Intracardiac thrombosis, not elsewhere classified: Secondary | ICD-10-CM

## 2023-05-07 DIAGNOSIS — I472 Ventricular tachycardia, unspecified: Secondary | ICD-10-CM | POA: Diagnosis not present

## 2023-05-07 DIAGNOSIS — I428 Other cardiomyopathies: Secondary | ICD-10-CM | POA: Diagnosis not present

## 2023-05-07 DIAGNOSIS — I493 Ventricular premature depolarization: Secondary | ICD-10-CM | POA: Diagnosis not present

## 2023-05-07 DIAGNOSIS — I272 Pulmonary hypertension, unspecified: Secondary | ICD-10-CM | POA: Diagnosis not present

## 2023-05-07 DIAGNOSIS — I5022 Chronic systolic (congestive) heart failure: Secondary | ICD-10-CM | POA: Diagnosis not present

## 2023-05-07 DIAGNOSIS — Z86711 Personal history of pulmonary embolism: Secondary | ICD-10-CM | POA: Diagnosis not present

## 2023-05-07 DIAGNOSIS — I110 Hypertensive heart disease with heart failure: Secondary | ICD-10-CM | POA: Insufficient documentation

## 2023-05-07 DIAGNOSIS — Z79899 Other long term (current) drug therapy: Secondary | ICD-10-CM | POA: Diagnosis not present

## 2023-05-07 DIAGNOSIS — E669 Obesity, unspecified: Secondary | ICD-10-CM | POA: Diagnosis not present

## 2023-05-07 DIAGNOSIS — Z7984 Long term (current) use of oral hypoglycemic drugs: Secondary | ICD-10-CM | POA: Diagnosis not present

## 2023-05-07 LAB — COMPREHENSIVE METABOLIC PANEL
ALT: 19 U/L (ref 0–44)
AST: 26 U/L (ref 15–41)
Albumin: 3.7 g/dL (ref 3.5–5.0)
Alkaline Phosphatase: 45 U/L (ref 38–126)
Anion gap: 11 (ref 5–15)
BUN: 15 mg/dL (ref 6–20)
CO2: 29 mmol/L (ref 22–32)
Calcium: 9.3 mg/dL (ref 8.9–10.3)
Chloride: 99 mmol/L (ref 98–111)
Creatinine, Ser: 1.63 mg/dL — ABNORMAL HIGH (ref 0.61–1.24)
GFR, Estimated: 52 mL/min — ABNORMAL LOW (ref >60)
Glucose, Bld: 141 mg/dL — ABNORMAL HIGH (ref 70–99)
Potassium: 4.6 mmol/L (ref 3.5–5.1)
Sodium: 139 mmol/L (ref 135–145)
Total Bilirubin: 1.6 mg/dL — ABNORMAL HIGH (ref 0.3–1.2)
Total Protein: 7.7 g/dL (ref 6.5–8.1)

## 2023-05-07 LAB — BRAIN NATRIURETIC PEPTIDE: B Natriuretic Peptide: 1575.6 pg/mL — ABNORMAL HIGH (ref 0.0–100.0)

## 2023-05-07 MED ORDER — ENTRESTO 97-103 MG PO TABS: 1.0000 | ORAL_TABLET | Freq: Two times a day (BID) | ORAL | 5 refills | Status: DC

## 2023-05-07 MED ORDER — EMPAGLIFLOZIN 10 MG PO TABS: 10.0000 mg | ORAL_TABLET | Freq: Every day | ORAL | 5 refills | Status: DC

## 2023-05-07 MED ORDER — APIXABAN 5 MG PO TABS: 5.0000 mg | ORAL_TABLET | Freq: Two times a day (BID) | ORAL | 3 refills | Status: AC

## 2023-05-07 MED ORDER — DIGOXIN 125 MCG PO TABS: 0.1250 mg | ORAL_TABLET | Freq: Every day | ORAL | 0 refills | Status: DC

## 2023-05-07 MED ORDER — TORSEMIDE 20 MG PO TABS: 20.0000 mg | ORAL_TABLET | Freq: Every day | ORAL | 5 refills | Status: DC

## 2023-05-07 MED ORDER — SPIRONOLACTONE 25 MG PO TABS: 12.5000 mg | ORAL_TABLET | Freq: Every day | ORAL | 3 refills | Status: DC

## 2023-05-07 MED ORDER — POTASSIUM CHLORIDE CRYS ER 20 MEQ PO TBCR: 20.0000 meq | EXTENDED_RELEASE_TABLET | Freq: Every day | ORAL | 5 refills | Status: DC

## 2023-05-07 MED ORDER — CARVEDILOL 6.25 MG PO TABS: 6.2500 mg | ORAL_TABLET | Freq: Two times a day (BID) | ORAL | 5 refills | Status: DC

## 2023-05-07 NOTE — Progress Notes (Signed)
ReDS Vest / Clip - 05/07/23 1300       ReDS Vest / Clip   Station Marker D    Ruler Value 38    ReDS Value Range High volume overload    ReDS Actual Value 43

## 2023-05-07 NOTE — Progress Notes (Addendum)
[facility] Note   PCP: [facility] in [facility] HF Cardiologist: Dr Gala Romney  HPI: Richard [name] is a 47 y.o. obese male with bilateral PEs and systolic HF diagnosed in 2017 with EF 10-15% Subsequent echo 4/18 EF 25-30%.  Bedside echo 12/21 EF 20-25%   Echo 11/20/16 EF 25-30% Probable small laminated LV thrombus.    Echo 05/2016: EF 10-15%. RV dilated LV thrombus    Admitted 06/24/21 w/ a/c systolic CHF, w/ marked fluid overload and progressive HF symptoms over period of a few weeks after a viral illness.  PICC line placed for co-oximetry and CVP monitoring. Initial Co-ox 61%.  Echo showed EF < 20%, RV moderately reduced, no LV thrombus. He responded well to IV diuretics. R/LHC showed severe NICM, EF 15%, normal cors, mildly elevated filling pressures w/ preserved CO and mild pulmonary venous HTN. Diuresis continued until euvolemic and GDMT further optimized. Transitioned from IV Lasix to PO torsemide 20 mg daily at discharge. Discharge weight 278 lb.  Given runs of NSVT, LifeVest was placed at time of discharge.   Today he returns for HF follow up. Feels great. Doing anything he wants but not exercising much. No CP or SOB. Compliant with meds.  Echo 09/20/21: LVEF 20% RV moderately reduced  No LV thrombus   He has not been seen since 02/23. He reports worsening dyspnea with exertion, fatigue, abdominal bloating and nausea X 1 month. He is accompanied by his girlfriend who assists with the history. Over the last couple of weeks he had been vomiting frequently after attempting to eat. He was seen in the ED. Symptoms sounded like heart burn and trial of H2 blockers recommended.  He subsequently called the clinic and Torsemide increased to daily. Notes some improvement in dyspnea and nausea after increasing diuretic. Has been able to eat more. No orthopnea, PND or lower extremity edema. He is compliant with his medications but did stop spironolactone for unclear  reasons.  Eats out a lot, has bojangles or [place] for breakfast most days. Drinks lots of fluids.  He works driving a Product/process development scientist.   FH: Negative for heart disease SH: No alcohol or drug use.   Review of systems complete and found to be negative unless listed in HPI.    SH:  Social History   Socioeconomic History   Marital status: Single    Spouse name: Not on file   Number of children: Not on file   Years of education: Not on file   Highest education level: Not on file  Occupational History   Not on file  Tobacco Use   Smoking status: Never   Smokeless tobacco: Never  Substance and Sexual Activity   Alcohol use: Yes    Comment: rare   Drug use: No   Sexual activity: Not on file  Other Topics Concern   Not on file  Social History Narrative   Not on file   Social Determinants of Health   Financial Resource Strain: Not on file  Food Insecurity: Not on file  Transportation Needs: Not on file  Physical Activity: Not on file  Stress: Not on file  Social Connections: Unknown (12/11/2021)   [facility]   Social Network    Social Network: Not on file  Intimate Partner Violence: Unknown (11/02/2021)   Received from [facility]   HITS    Physically Hurt: Not on file    Insult or Talk Down To: Not  on file    Threaten Physical Harm: Not on file    Scream or Curse: Not on file   FH:  Family History  Problem Relation Age of Onset   Other Mother        Neg family history of clotting disorder or heart disease   Clotting disorder Neg Hx    Rheumatologic disease Neg Hx    Past Medical History:  Diagnosis Date   CHF (congestive heart failure) (HCC)    Current Outpatient Medications  Medication Sig Dispense Refill   carvedilol (COREG) 6.25 MG tablet TAKE 1 TABLET BY MOUTH TWICE DAILY WITH MEALS . APPOINTMENT REQUIRED FOR FUTURE REFILLS 60 tablet 0   digoxin (LANOXIN) 0.125 MG tablet TAKE 1 TABLET BY MOUTH ONCE  DAILY APPOINTMENT  REQUIRED  FOR  FUTURE  REFILLS 15 tablet 0   ELIQUIS 5 MG TABS tablet TAKE 1 TABLET BY MOUTH TWICE DAILY . APPOINTMENT REQUIRED FOR FUTURE REFILLS 180 tablet 0   ENTRESTO 97-103 MG TAKE 1 TABLET BY MOUTH TWICE DAILY APPOINTMENT  REQUIRED  FOR  FUTURE  REFILLS 60 tablet 0   JARDIANCE 10 MG TABS tablet TAKE 1 TABLET BY MOUTH BEFORE BREAKFAST APPOINTMENT  REQUIRED  FOR  FUTURE  REFILLS 30 tablet 0   mexiletine (MEXITIL) 200 MG capsule TAKE 1 CAPSULE BY MOUTH TWICE DAILY MUST  MAKE  APPOINTMENT  FOR  FUTURE  REFILLS 60 capsule 0   potassium chloride SA (KLOR-CON M) 20 MEQ tablet Take 1 tablet (20 mEq total) by mouth daily. 30 tablet 0   spironolactone (ALDACTONE) 25 MG tablet Take 1 tablet by mouth once daily 90 tablet 3   torsemide (DEMADEX) 20 MG tablet Take 1 tablet (20 mg total) by mouth daily. 30 tablet 0   No current facility-administered medications for this visit.   There were no vitals taken for this visit.  Wt Readings from Last 3 Encounters:  09/20/21 (!) 141.3 kg (311 lb 6.4 oz)  08/06/21 (!) 136.5 kg (301 lb)  07/09/21 129.1 kg (284 lb 9.6 oz)   PHYSICAL EXAM: General:  Well appearing. Ambulated into clinic. HEENT: normal Neck: supple. Thick neck. JVP difficult Cor: PMI nondisplaced. Regular rate & rhythm. Distant heart sounds. No rubs, gallops or murmurs. Lungs: clear Abdomen: soft, nontender, nondistended.  Extremities: no cyanosis, clubbing, rash, edema Neuro: alert & oriented x 3. Affect pleasant  ECG: SR w/ 1st degree AVB, 78 bpm   ASSESSMENT & PLAN:  1. Chronic Systolic Heart Failure/NICM - Echo 06/17/16 EF 10-15%. With LV thrombus. 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  - Echo 10/2016 with EF 25-30% laminated LV thrombus. Cannot exclude non-compaction.  - Echo 08/22 EF < 20%, RV okay, trivial MR - Admit 11/27 with a/c HF - progressive HF symptoms over several weeks after a viral illness.  - Echo repeated EF < 20%, RV moderately reduced  -  [facility] 11/22 showed severe NICM, EF 15%, normal cors, mildly elevated filling pressures w/ preserved CO and mild pulmonary venous HTN.  - CPX (1/23): Moderate HF impairment, limitations also related to body habitus and deconditioning. - Echo (02/23): EF < 20%, RV moderately reduced - Zio 03/23: 10% PVC burden - NYHA III. Volume overloaded. ReDS 43%. Recently increased Torsemide to 40 mg daily with improvement in symptoms, will continue higher dose. Cut back sodium and fluid intake, this was discussed in detail. - Add back spiro at 12.5 mg daily - Continue Entresto 97/103 mg bid.  - Continue spiro 25  mg daily. - Continue carvedilol 6.25 mg bid.  - Continue Jardiance 10 mg daily. - Continue digoxin 0.125 mg daily. - QRS narrow, not a candidate for CRT.  - I am concerned he may be approaching end-stage. He remains adamant he does not want to consider ICD or advanced therapies - Labs today - Echo scheduled next month  2. Bilateral PE  - Noted on CTA 11/17.  - Continue Eliquis 5mg  bid - Based on Amplify-EXT data can consider dropping to 2.5 bid but given size, elected not to drop dose.   3. Large LV thrombus - Noted on Echo on 05/2016. - Repeat echo 11/22 & 2/23 - no thrombus - On Eliquis.    4. NSVT/PVCs - Not interested in ICD - 10% PVC burden on Zio 02/23 - Continue mexiletine 200 mg BID - Has not completed sleep study  Follow-up: As scheduled 10/18 to assess volume, echo 11/06  Caven Perine N, PA-C  7:21 AM

## 2023-05-07 NOTE — Addendum Note (Signed)
Encounter addended by: Andrey Farmer, PA-C on: 05/07/2023 5:27 PM  Actions taken: Visit diagnoses modified, Clinical Note Signed

## 2023-05-07 NOTE — Patient Instructions (Addendum)
Medication Changes:  TAKE YOUR TORSEMIDE 20MG  ONCE DAILY   START: SPIRONOLACTONE 12.5MG  ONCE DAILY   Lab Work:  Labs done today, your results will be available in MyChart, we will contact you for abnormal readings.  Testing/Procedures:  Your physician has requested that you have an echocardiogram AS SCHEDULED. Echocardiography is a painless test that uses sound waves to create images of your heart. It provides your doctor with information about the size and shape of your heart and how well your heart's chambers and valves are working. This procedure takes approximately one hour. There are no restrictions for this procedure. Please do NOT wear cologne, perfume, aftershave, or lotions (deodorant is allowed). Please arrive 15 minutes prior to your appointment time.  Follow-Up in: AS SCHEDULED   At the [facility], you and your health needs are our priority. We have a designated team specialized in the treatment of Heart Failure. This Care Team includes your primary Heart Failure Specialized Cardiologist (physician), Advanced Practice Providers (APPs- Physician Assistants and Nurse Practitioners), and Pharmacist who all work together to provide you with the care you need, when you need it.   You may see any of the following providers on your designated Care Team at your next follow up:  Dr. Arvilla Meres Dr. Marca Ancona Dr. Dorthula Nettles Dr. Theresia Bough Tonye Becket, NP Robbie Lis, [place] [facility] [place], [place] Brynda Peon, NP [place] Lee, NP Karle Plumber, PharmD   Please be sure to bring in all your medications bottles to every appointment.   Need to Contact us:  If you have any questions or concerns before your next appointment please send [place] a message through [place] or call our office at [phone].    TO LEAVE A MESSAGE FOR THE NURSE SELECT OPTION 2, PLEASE LEAVE A MESSAGE INCLUDING: YOUR NAME DATE OF BIRTH CALL BACK NUMBER REASON FOR  CALL**this is important as we prioritize the call backs  YOU WILL RECEIVE A CALL BACK THE SAME DAY AS LONG AS YOU CALL BEFORE 4:00 PM

## 2023-05-08 ENCOUNTER — Telehealth (HOSPITAL_COMMUNITY): Payer: Self-pay | Admitting: *Deleted

## 2023-05-08 NOTE — Telephone Encounter (Signed)
Called patient per Richard Cobble, PA with following lab results:   "His BNP is elevated and Creatinine up. Likely d/t fluid. We are continuing higher dose of Torsemide and restarting spiro. Mexiletine is on his med list as once a day. This is a twice a day medicine. How is he taking it?"  Pt confirms he is taking Mexiletine twice daily; med list updated. Asked patient to call us at [phone] if any questions or concerns prior to next visit. Pt verbalized understanding of above.

## 2023-05-14 NOTE — Progress Notes (Signed)
Advanced Heart Failure Clinic Note   PCP: [facility] in [place] HF Cardiologist: Dr Gala Romney  HPI: Richard [name] is a 47 y.o. obese male with bilateral PEs and systolic HF diagnosed in 2017 with EF 10-15% Subsequent echo 4/18 EF 25-30%.  Bedside echo 12/21 EF 20-25%   Echo 11/20/16 EF 25-30% Probable small laminated LV thrombus.    Echo 05/2016: EF 10-15%. RV dilated LV thrombus    Admitted 06/24/21 w/ a/c systolic CHF, w/ marked fluid overload and progressive HF symptoms over period of a few weeks after a viral illness.  PICC line placed for co-oximetry and CVP monitoring. Initial Co-ox 61%.  Echo showed EF < 20%, RV moderately reduced, no LV thrombus. He responded well to IV diuretics. R/LHC showed severe NICM, EF 15%, normal cors, mildly elevated filling pressures w/ preserved CO and mild pulmonary venous HTN. Diuresis continued until euvolemic and GDMT further optimized. Transitioned from IV Lasix to PO torsemide 20 mg daily at discharge. Discharge weight 278 lb.  Given runs of NSVT, LifeVest was placed at time of discharge.   Echo 09/20/21: LVEF 20% RV moderately reduced  No LV thrombus   Follow up 05/07/23, volume overloaded ReDs 43%. Cleda Daub restarted, continued on torsemide 40 daily.  Today he returns for HF follow up. Overall feeling better. He is not SOB with activity. Denies palpitations, CP, dizziness, edema, or PND/Orthopnea. Appetite ok. No fever or chills. Weight at home 309 pounds. Taking all medications. He works driving a Product/process development scientist. He is not sure if he snores. No tobacco/ETOH/drug use.  FH: Negative for heart disease SH: No alcohol or drug use.   Review of systems complete and found to be negative unless listed in HPI.    SH:  Social History   Socioeconomic History   Marital status: Single    Spouse name: Not on file   Number of children: Not on file   Years of education: Not on file   Highest education level: Not on file  Occupational History    Not on file  Tobacco Use   Smoking status: Never   Smokeless tobacco: Never  Substance and Sexual Activity   Alcohol use: Yes    Comment: rare   Drug use: No   Sexual activity: Not on file  Other Topics Concern   Not on file  Social History Narrative   Not on file   Social Determinants of Health   Financial Resource Strain: Not on file  Food Insecurity: Not on file  Transportation Needs: Not on file  Physical Activity: Not on file  Stress: Not on file  Social Connections: Unknown (12/11/2021)   Received from [facility], [facility]   Social Network    Social Network: Not on file  Intimate Partner Violence: Unknown (11/02/2021)   Received from [facility], [facility]   HITS    Physically Hurt: Not on file    Insult or Talk Down To: Not on file    Threaten Physical Harm: Not on file    Scream or Curse: Not on file   FH:  Family History  Problem Relation Age of Onset   Other Mother        Neg family history of clotting disorder or heart disease   Clotting disorder Neg Hx    Rheumatologic disease Neg Hx    Past Medical History:  Diagnosis Date   CHF (congestive heart failure) (HCC)    Current Outpatient Medications  Medication Sig Dispense Refill  apixaban (ELIQUIS) 5 MG TABS tablet Take 1 tablet (5 mg total) by mouth 2 (two) times daily. 180 tablet 3   carvedilol (COREG) 6.25 MG tablet Take 1 tablet (6.25 mg total) by mouth 2 (two) times daily with a meal. 60 tablet 5   digoxin (LANOXIN) 0.125 MG tablet Take 1 tablet (0.125 mg total) by mouth daily. 15 tablet 0   empagliflozin (JARDIANCE) 10 MG TABS tablet Take 1 tablet (10 mg total) by mouth daily. 30 tablet 5   mexiletine (MEXITIL) 200 MG capsule TAKE 1 CAPSULE BY MOUTH TWICE DAILY MUST  MAKE  APPOINTMENT  FOR  FUTURE  REFILLS 60 capsule 0   potassium chloride SA (KLOR-CON M) 20 MEQ tablet Take 1 tablet (20 mEq total) by mouth daily. 30 tablet 5   sacubitril-valsartan (ENTRESTO) 97-103 MG Take 1 tablet  by mouth 2 (two) times daily. 60 tablet 5   spironolactone (ALDACTONE) 25 MG tablet Take 0.5 tablets (12.5 mg total) by mouth daily. 45 tablet 3   torsemide (DEMADEX) 20 MG tablet Take 1 tablet (20 mg total) by mouth daily. 30 tablet 5   No current facility-administered medications for this encounter.   BP 90/62   Pulse 85   Wt (!) 143.6 kg (316 lb 9.6 oz)   SpO2 96%   BMI 35.67 kg/m   Wt Readings from Last 3 Encounters:  05/16/23 (!) 143.6 kg (316 lb 9.6 oz)  05/07/23 (!) 143.5 kg (316 lb 6.4 oz)  09/20/21 (!) 141.3 kg (311 lb 6.4 oz)   PHYSICAL EXAM: General:  NAD. No resp difficulty, walked into clinic HEENT: Normal Neck: Supple. JVP 8-10 Carotids 2+ bilat; no bruits. No lymphadenopathy or thryomegaly appreciated. Cor: PMI nondisplaced. Regular rate & rhythm. No rubs, gallops or murmurs. Lungs: Clear Abdomen: Soft, nontender, nondistended. No hepatosplenomegaly. No bruits or masses. Good bowel sounds. Extremities: No cyanosis, clubbing, rash, edema Neuro: Alert & oriented x 3, cranial nerves grossly intact. Moves all 4 extremities w/o difficulty. Affect pleasant.  ReDs: 40%  ASSESSMENT & PLAN: 1. Chronic Systolic Heart Failure/NICM - Echo 11/17: EF 10-15%. With LV thrombus. 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  - Echo 4/18: EF 25-30% laminated LV thrombus. Cannot exclude non-compaction.  - Echo 8/22: EF < 20%, RV okay, trivial MR - Echo 11/22: EF < 20%, RV moderately reduced  - R/LHC 11/22: severe NICM, EF 15%, normal cors, mildly elevated filling pressures w/ preserved CO and mild pulmonary venous HTN.  - CPX 1/23: Moderate HF impairment, limitations also related to body habitus and deconditioning. - Echo 2/23: EF < 20%, RV moderately reduced - Zio 3/23: 10% PVC burden - Improved NYHA I-II. Volume better but still overloaded. ReDS 43-->40% today.  - Increase torsemide to 40 mg daily, increase KCL to 40 daily. - Decrease ENtresto to 49/51 mg bid to allow more BP for  diuresis. - Continue spiro 12.5 mg daily. - Continue carvedilol 6.25 mg bid.  - Continue Jardiance 10 mg daily. - Continue digoxin 0.125 mg daily. - QRS narrow, not a candidate for CRT.  - I am concerned he may be approaching end-stage. He is not interested in ICD or advanced therapies - Labs today, BMET in 10-14 days. - Echo scheduled next month.  2. Bilateral PE  - Noted on CTA 11/17.  - Continue Eliquis 5 mg bid. No bleeding issues. - Based on Amplify-EXT data can consider dropping to 2.5 bid but given size, elected not to drop dose. - Recent CBC stable  3. Large LV thrombus - Noted on Echo on 05/2016. - Repeat echo 11/22 & 2/23 - no thrombus - On Eliquis.    4. NSVT/PVCs - Not interested in ICD - 10% PVC burden on Zio 2/23 - Continue mexiletine 200 mg bid - Arrange sleep study next visit.  Follow up in 3-4 weeks with APP (will try to arrange on the day of his repeat echo) and 3 months with Dr. Gala Romney. He was given a note today for employer to remain out of work until 05/26/23.  Jacklynn Ganong, FNP  2:14 PM

## 2023-05-16 ENCOUNTER — Ambulatory Visit (HOSPITAL_COMMUNITY)
Admission: RE | Admit: 2023-05-16 | Discharge: 2023-05-16 | Disposition: A | Discharge status: Home / Self Care | Payer: Medicaid Other | Source: Ambulatory Visit | Attending: Family Medicine | Admitting: Family Medicine

## 2023-05-16 ENCOUNTER — Encounter (HOSPITAL_COMMUNITY): Payer: Self-pay

## 2023-05-16 VITALS — BP 90/62 | HR 85 | Wt 316.6 lb

## 2023-05-16 DIAGNOSIS — I4729 Other ventricular tachycardia: Secondary | ICD-10-CM | POA: Diagnosis not present

## 2023-05-16 DIAGNOSIS — Z7984 Long term (current) use of oral hypoglycemic drugs: Secondary | ICD-10-CM | POA: Diagnosis not present

## 2023-05-16 DIAGNOSIS — I472 Ventricular tachycardia, unspecified: Secondary | ICD-10-CM | POA: Diagnosis not present

## 2023-05-16 DIAGNOSIS — I2699 Other pulmonary embolism without acute cor pulmonale: Secondary | ICD-10-CM | POA: Diagnosis not present

## 2023-05-16 DIAGNOSIS — I493 Ventricular premature depolarization: Secondary | ICD-10-CM | POA: Insufficient documentation

## 2023-05-16 DIAGNOSIS — I428 Other cardiomyopathies: Secondary | ICD-10-CM | POA: Insufficient documentation

## 2023-05-16 DIAGNOSIS — I5022 Chronic systolic (congestive) heart failure: Secondary | ICD-10-CM | POA: Insufficient documentation

## 2023-05-16 DIAGNOSIS — I110 Hypertensive heart disease with heart failure: Secondary | ICD-10-CM | POA: Insufficient documentation

## 2023-05-16 DIAGNOSIS — Z79899 Other long term (current) drug therapy: Secondary | ICD-10-CM | POA: Diagnosis not present

## 2023-05-16 DIAGNOSIS — Z7901 Long term (current) use of anticoagulants: Secondary | ICD-10-CM | POA: Diagnosis not present

## 2023-05-16 DIAGNOSIS — I513 Intracardiac thrombosis, not elsewhere classified: Secondary | ICD-10-CM | POA: Diagnosis not present

## 2023-05-16 LAB — BRAIN NATRIURETIC PEPTIDE: B Natriuretic Peptide: 546.9 pg/mL — ABNORMAL HIGH (ref 0.0–100.0)

## 2023-05-16 LAB — BASIC METABOLIC PANEL
Anion gap: 10 (ref 5–15)
BUN: 16 mg/dL (ref 6–20)
CO2: 29 mmol/L (ref 22–32)
Calcium: 9.7 mg/dL (ref 8.9–10.3)
Chloride: 98 mmol/L (ref 98–111)
Creatinine, Ser: 1.32 mg/dL — ABNORMAL HIGH (ref 0.61–1.24)
GFR, Estimated: >60 mL/min (ref >60)
Glucose, Bld: 139 mg/dL — ABNORMAL HIGH (ref 70–99)
Potassium: 4.6 mmol/L (ref 3.5–5.1)
Sodium: 137 mmol/L (ref 135–145)

## 2023-05-16 LAB — DIGOXIN LEVEL: Digoxin Level: 0.7 ng/mL — ABNORMAL LOW (ref 0.8–2.0)

## 2023-05-16 MED ORDER — ENTRESTO 49-51 MG PO TABS: 1.0000 | ORAL_TABLET | Freq: Two times a day (BID) | ORAL | 6 refills | Status: DC

## 2023-05-16 MED ORDER — TORSEMIDE 20 MG PO TABS: 40.0000 mg | ORAL_TABLET | Freq: Every day | ORAL | 6 refills | Status: DC

## 2023-05-16 MED ORDER — POTASSIUM CHLORIDE CRYS ER 20 MEQ PO TBCR: 40.0000 meq | EXTENDED_RELEASE_TABLET | Freq: Every day | ORAL | 6 refills | Status: DC

## 2023-05-16 NOTE — Progress Notes (Signed)
ReDS Vest / Clip - 05/16/23 1400       ReDS Vest / Clip   Station Marker D    Ruler Value 38    ReDS Value Range Moderate volume overload    ReDS Actual Value 40

## 2023-05-16 NOTE — Addendum Note (Signed)
Encounter addended by: Demetrius Charity, RN on: 05/16/2023 2:46 PM  Actions taken: Clinical Note Signed

## 2023-05-16 NOTE — Patient Instructions (Addendum)
Thank you for coming in today  If you had labs drawn today, any labs that are abnormal the clinic will call you No news is good news  Medications: Increase Torsemide to 40 mg daily INCREASE Potassium to 40 meq daily DECREASE Entresto to 49/51 mg 1 tablet twice daily   Follow up appointments:  Your physician recommends that you schedule a follow-up appointment in:  After echocardiogram on 06/04/2023 please check in for clinic follow up appointment at 12pm  3 months With Dr. Gala Romney You will receive a reminder letter in the mail a few months in advance. If you don't receive a letter, please call our office to schedule the follow-up appointment.    Do the following things EVERYDAY: Weigh yourself in the morning before breakfast. Write it down and keep it in a log. Take your medicines as prescribed Eat low salt foods--Limit salt (sodium) to 2000 mg per day.  Stay as active as you can everyday Limit all fluids for the day to less than 2 liters   At the Advanced Heart Failure Clinic, you and your health needs are our priority. As part of our continuing mission to provide you with exceptional heart care, we have created designated Provider Care Teams. These Care Teams include your primary Cardiologist (physician) and Advanced Practice Providers (APPs- Physician Assistants and Nurse Practitioners) who all work together to provide you with the care you need, when you need it.   You may see any of the following providers on your designated Care Team at your next follow up: Dr Arvilla Meres Dr Marca Ancona Dr. Marcos Eke, NP Robbie Lis, [place] [facility] [place], [place] Brynda Peon, NP Karle Plumber, PharmD   Please be sure to bring in all your medications bottles to every appointment.    Thank you for choosing [facility]  If you have any questions or concerns before your next appointment please send [place] a  message through [place] or call our office at [phone].    TO LEAVE A MESSAGE FOR THE NURSE SELECT OPTION 2, PLEASE LEAVE A MESSAGE INCLUDING: YOUR NAME DATE OF BIRTH CALL BACK NUMBER REASON FOR CALL**this is important as we prioritize the call backs  YOU WILL RECEIVE A CALL BACK THE SAME DAY AS LONG AS YOU CALL BEFORE 4:00 PM

## 2023-05-26 ENCOUNTER — Ambulatory Visit (HOSPITAL_COMMUNITY)
Admission: RE | Admit: 2023-05-26 | Discharge: 2023-05-26 | Disposition: A | Discharge status: Home / Self Care | Payer: Medicaid Other | Source: Ambulatory Visit | Attending: Cardiology | Admitting: Cardiology

## 2023-05-26 ENCOUNTER — Encounter (HOSPITAL_COMMUNITY): Payer: Self-pay | Admitting: Cardiology

## 2023-05-26 DIAGNOSIS — I5022 Chronic systolic (congestive) heart failure: Secondary | ICD-10-CM | POA: Diagnosis present

## 2023-05-26 LAB — BASIC METABOLIC PANEL
Anion gap: 10 (ref 5–15)
BUN: 31 mg/dL — ABNORMAL HIGH (ref 6–20)
CO2: 28 mmol/L (ref 22–32)
Calcium: 9.4 mg/dL (ref 8.9–10.3)
Chloride: 96 mmol/L — ABNORMAL LOW (ref 98–111)
Creatinine, Ser: 1.73 mg/dL — ABNORMAL HIGH (ref 0.61–1.24)
GFR, Estimated: 49 mL/min — ABNORMAL LOW (ref >60)
Glucose, Bld: 200 mg/dL — ABNORMAL HIGH (ref 70–99)
Potassium: 4.8 mmol/L (ref 3.5–5.1)
Sodium: 134 mmol/L — ABNORMAL LOW (ref 135–145)

## 2023-05-27 ENCOUNTER — Telehealth (HOSPITAL_COMMUNITY): Payer: Self-pay

## 2023-05-27 NOTE — Telephone Encounter (Signed)
Spoke with patient regarding the following results. Patient made aware and patient verbalized understanding.   Patient is scheduled to return to clinic on 11/6- will get repeat BMET at that appointment. Note added to appointment notes.

## 2023-05-27 NOTE — Telephone Encounter (Signed)
-----   Message from Jacklynn Ganong sent at 05/26/2023  4:48 PM EDT ----- Renal function elevated.  Repeat BMET in 7-10 days

## 2023-06-04 ENCOUNTER — Encounter (HOSPITAL_COMMUNITY): Payer: Self-pay

## 2023-06-04 ENCOUNTER — Ambulatory Visit (HOSPITAL_COMMUNITY)
Admission: RE | Admit: 2023-06-04 | Discharge: 2023-06-04 | Disposition: A | Discharge status: Home / Self Care | Payer: Medicaid Other | Source: Ambulatory Visit | Attending: Internal Medicine | Admitting: Internal Medicine

## 2023-06-04 ENCOUNTER — Ambulatory Visit (HOSPITAL_COMMUNITY)
Admission: RE | Admit: 2023-06-04 | Discharge: 2023-06-04 | Disposition: A | Discharge status: Home / Self Care | Payer: Medicaid Other | Source: Ambulatory Visit | Attending: Cardiology | Admitting: Cardiology

## 2023-06-04 VITALS — BP 90/58 | HR 89 | Wt 304.6 lb

## 2023-06-04 DIAGNOSIS — I5022 Chronic systolic (congestive) heart failure: Secondary | ICD-10-CM | POA: Insufficient documentation

## 2023-06-04 DIAGNOSIS — I110 Hypertensive heart disease with heart failure: Secondary | ICD-10-CM | POA: Insufficient documentation

## 2023-06-04 DIAGNOSIS — Z7901 Long term (current) use of anticoagulants: Secondary | ICD-10-CM | POA: Diagnosis not present

## 2023-06-04 DIAGNOSIS — Z7984 Long term (current) use of oral hypoglycemic drugs: Secondary | ICD-10-CM | POA: Insufficient documentation

## 2023-06-04 DIAGNOSIS — E669 Obesity, unspecified: Secondary | ICD-10-CM | POA: Diagnosis not present

## 2023-06-04 DIAGNOSIS — I428 Other cardiomyopathies: Secondary | ICD-10-CM | POA: Diagnosis not present

## 2023-06-04 DIAGNOSIS — I493 Ventricular premature depolarization: Secondary | ICD-10-CM | POA: Insufficient documentation

## 2023-06-04 DIAGNOSIS — I472 Ventricular tachycardia, unspecified: Secondary | ICD-10-CM | POA: Insufficient documentation

## 2023-06-04 DIAGNOSIS — I2699 Other pulmonary embolism without acute cor pulmonale: Secondary | ICD-10-CM | POA: Diagnosis not present

## 2023-06-04 DIAGNOSIS — Z79899 Other long term (current) drug therapy: Secondary | ICD-10-CM | POA: Insufficient documentation

## 2023-06-04 LAB — BASIC METABOLIC PANEL
Anion gap: 10 (ref 5–15)
BUN: 16 mg/dL (ref 6–20)
CO2: 28 mmol/L (ref 22–32)
Calcium: 9.6 mg/dL (ref 8.9–10.3)
Chloride: 98 mmol/L (ref 98–111)
Creatinine, Ser: 1.28 mg/dL — ABNORMAL HIGH (ref 0.61–1.24)
GFR, Estimated: >60 mL/min (ref >60)
Glucose, Bld: 111 mg/dL — ABNORMAL HIGH (ref 70–99)
Potassium: 4.6 mmol/L (ref 3.5–5.1)
Sodium: 136 mmol/L (ref 135–145)

## 2023-06-04 LAB — ECHOCARDIOGRAM COMPLETE
Est EF: <20
MV M vel: 3.7 m/s
MV Peak grad: 54.8 mm[Hg]
S' Lateral: 7.1 cm
Single Plane A4C EF: 18.6 %

## 2023-06-04 LAB — DIGOXIN LEVEL: Digoxin Level: 0.6 ng/mL — ABNORMAL LOW (ref 0.8–2.0)

## 2023-06-04 NOTE — Patient Instructions (Signed)
There has been no changes to your medications.  Labs done today, your results will be available in MyChart, we will contact you for abnormal readings.  Your physician recommends that you schedule a follow-up appointment in: 3 months (February 2025) ** PLEASE CALL THE OFFICE IN DECEMBER TO ARRANGE YOUR FOLLOW UP APPOINTMENT. **  If you have any questions or concerns before your next appointment please send [place] a message through [place] or call our office at [phone].    TO LEAVE A MESSAGE FOR THE NURSE SELECT OPTION 2, PLEASE LEAVE A MESSAGE INCLUDING: YOUR NAME DATE OF BIRTH CALL BACK NUMBER REASON FOR CALL**this is important as we prioritize the call backs  YOU WILL RECEIVE A CALL BACK THE SAME DAY AS LONG AS YOU CALL BEFORE 4:00 PM  At the Advanced [facility] Failure Clinic, you and your health needs are our priority. As part of our continuing mission to provide you with exceptional heart care, we have created designated Provider Care Teams. These Care Teams include your primary Cardiologist (physician) and Advanced Practice Providers (APPs- Physician Assistants and Nurse Practitioners) who all work together to provide you with the care you need, when you need it.   You may see any of the following providers on your designated Care Team at your next follow up: Dr Arvilla Meres Dr Marca Ancona Dr. Dorthula Nettles Dr. Clearnce Hasten Amy Filbert Schilder, NP Robbie Lis, [place] [facility] [place], [place] Brynda Peon, NP [place] Lee, NP Karle Plumber, PharmD   Please be sure to bring in all your medications bottles to every appointment.    Thank you for choosing [facility]

## 2023-06-04 NOTE — Progress Notes (Signed)
ReDS Vest / Clip - 06/04/23 1200       ReDS Vest / Clip   Station Marker D    Ruler Value 37    ReDS Value Range Low volume    ReDS Actual Value 35

## 2023-06-04 NOTE — Progress Notes (Signed)
Advanced Heart Failure Clinic Note   PCP: [facility] in [place] HF Cardiologist: Dr Gala Romney  HPI: Richard [name] is a 47 y.o. obese male with bilateral PEs and systolic HF diagnosed in 2017 with EF 10-15% Subsequent echo 4/18 EF 25-30%.  Bedside echo 12/21 EF 20-25%   Echo 11/20/16 EF 25-30% Probable small laminated LV thrombus.    Echo 05/2016: EF 10-15%. RV dilated LV thrombus    Admitted 06/24/21 w/ a/c systolic CHF, w/ marked fluid overload and progressive HF symptoms over period of a few weeks after a viral illness.  PICC line placed for co-oximetry and CVP monitoring. Initial Co-ox 61%.  Echo showed EF < 20%, RV moderately reduced, no LV thrombus. He responded well to IV diuretics. R/LHC showed severe NICM, EF 15%, normal cors, mildly elevated filling pressures w/ preserved CO and mild pulmonary venous HTN. Diuresis continued until euvolemic and GDMT further optimized. Transitioned from IV Lasix to PO torsemide 20 mg daily at discharge. Discharge weight 278 lb.  Given runs of NSVT, LifeVest was placed at time of discharge.   Echo 09/20/21: LVEF 20% RV moderately reduced  No LV thrombus   Follow up 05/07/23, volume overloaded ReDs 43%. Cleda Daub restarted, continued on torsemide 40 daily.  On return f/u on 10/18, he was improved but still volume overloaded. ReDs 43>>40%. He was instructed to increase torsemide to 40 mg daily. Entresto was reduced to 49-51 mg bid to allow BP room to push diuresis.   He presents back again today for f/u to reassess volume status and for repeat echo.   Wt is down 12 lb since last visit, 316>>304 lb. ReDs down to 35%. BP low, 90/58, but denies any dizziness or orthostatic symptoms. Reports breathing has improved. Denies exertional fatigue. No LEE, orthopnea/PND. Reports full compliance w/ meds and continued good UOP w/ current torsemide dose.   Echo completed. Results pending.   We discussed advanced therapies again today as well as ICD. He  continues to report that he would not want to consider these options at this time.   Also recommended completion of sleep study as previously advised. He declines as well.    FH: Negative for heart disease SH: No alcohol or drug use.   Review of systems complete and found to be negative unless listed in HPI.    SH:  Social History   Socioeconomic History   Marital status: Single    Spouse name: Not on file   Number of children: Not on file   Years of education: Not on file   Highest education level: Not on file  Occupational History   Not on file  Tobacco Use   Smoking status: Never   Smokeless tobacco: Never  Substance and Sexual Activity   Alcohol use: Yes    Comment: rare   Drug use: No   Sexual activity: Not on file  Other Topics Concern   Not on file  Social History Narrative   Not on file   Social Determinants of Health   Financial Resource Strain: Not on file  Food Insecurity: Not on file  Transportation Needs: Not on file  Physical Activity: Not on file  Stress: Not on file  Social Connections: Unknown (12/11/2021)   Received from [facility]   Social Network    Social Network: Not on file  Intimate Partner Violence: Unknown (11/02/2021)   Received from [facility], [facility]   HITS    Physically Hurt: Not on  file    Insult or Talk Down To: Not on file    Threaten Physical Harm: Not on file    Scream or Curse: Not on file   FH:  Family History  Problem Relation Age of Onset   Other Mother        Neg family history of clotting disorder or heart disease   Clotting disorder Neg Hx    Rheumatologic disease Neg Hx    Past Medical History:  Diagnosis Date   CHF (congestive heart failure) (HCC)    Current Outpatient Medications  Medication Sig Dispense Refill   apixaban (ELIQUIS) 5 MG TABS tablet Take 1 tablet (5 mg total) by mouth 2 (two) times daily. 180 tablet 3   carvedilol (COREG) 6.25 MG tablet Take 1 tablet (6.25 mg  total) by mouth 2 (two) times daily with a meal. 60 tablet 5   digoxin (LANOXIN) 0.125 MG tablet Take 1 tablet (0.125 mg total) by mouth daily. 15 tablet 0   empagliflozin (JARDIANCE) 10 MG TABS tablet Take 1 tablet (10 mg total) by mouth daily. 30 tablet 5   mexiletine (MEXITIL) 200 MG capsule TAKE 1 CAPSULE BY MOUTH TWICE DAILY MUST  MAKE  APPOINTMENT  FOR  FUTURE  REFILLS 60 capsule 0   potassium chloride SA (KLOR-CON M) 20 MEQ tablet Take 2 tablets (40 mEq total) by mouth daily. 30 tablet 6   sacubitril-valsartan (ENTRESTO) 49-51 MG Take 1 tablet by mouth 2 (two) times daily. 60 tablet 6   spironolactone (ALDACTONE) 25 MG tablet Take 0.5 tablets (12.5 mg total) by mouth daily. 45 tablet 3   torsemide (DEMADEX) 20 MG tablet Take 2 tablets (40 mg total) by mouth daily. 30 tablet 6   No current facility-administered medications for this encounter.   BP (!) 90/58   Pulse 89   Wt (!) 138.2 kg (304 lb 9.6 oz)   SpO2 97%   BMI 34.31 kg/m   Wt Readings from Last 3 Encounters:  06/04/23 (!) 138.2 kg (304 lb 9.6 oz)  05/16/23 (!) 143.6 kg (316 lb 9.6 oz)  05/07/23 (!) 143.5 kg (316 lb 6.4 oz)   PHYSICAL EXAM: ReDs 35%  General:  Well appearing. No respiratory difficulty HEENT: normal Neck: supple. no JVD. Carotids 2+ bilat; no bruits. No lymphadenopathy or thyromegaly appreciated. Cor: PMI nondisplaced. Regular rate & rhythm. No rubs, gallops or murmurs. Lungs: clear Abdomen: soft, nontender, nondistended. No hepatosplenomegaly. No bruits or masses. Good bowel sounds. Extremities: no cyanosis, clubbing, rash, edema Neuro: alert & oriented x 3, cranial nerves grossly intact. moves all 4 extremities w/o difficulty. Affect pleasant.   ASSESSMENT & PLAN: 1. Chronic Systolic Heart Failure/NICM - Echo 11/17: EF 10-15%. With LV thrombus. 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  - Echo 4/18: EF 25-30% laminated LV thrombus. Cannot exclude non-compaction.  - Echo 8/22: EF < 20%, RV okay,  trivial MR - Echo 11/22: EF < 20%, RV moderately reduced  - R/LHC 11/22: severe NICM, EF 15%, normal cors, mildly elevated filling pressures w/ preserved CO and mild pulmonary venous HTN.  - CPX 1/23: Moderate HF impairment, limitations also related to body habitus and deconditioning. - Echo 2/23: EF < 20%, RV moderately reduced - Zio 3/23: 10% PVC burden - Echo completed today. Results pending  - Improved NYHA I-II. Volume much improved. Wt down 12 lb. ReDs down from 43>>35%.  - Continue torsemide 40 mg daily - Continue Entresto 49/51 mg bid. BP too soft for titration   -  Continue spiro 12.5 mg daily. - Continue carvedilol 6.25 mg bid.  - Continue Jardiance 10 mg daily. - Continue digoxin 0.125 mg daily. Check digoxin level today - QRS narrow, not a candidate for CRT.  - I am concerned he may be approaching end-stage. He is not interested in ICD or advanced therapies -check BMP/BNP today.  2. Bilateral PE  - Noted on CTA 11/17.  - Continue Eliquis 5 mg bid. No bleeding issues. - Based on Amplify-EXT data can consider dropping to 2.5 bid but given size, elected not to drop dose. - Check CBC   3. Large LV Thrombus - Noted on Echo on 05/2016. - Repeat echo 11/22 & 2/23 - no thrombus - On Eliquis.    4. NSVT/PVCs - Not interested in ICD - 10% PVC burden on Zio 2/23 - Continue mexiletine 200 mg bid - Discussed sleep study. He declined to arrange at this time   F/u w/ MD in 3 months, sooner if needed.    Jomarie Gellis, PA-C  11:59 AM

## 2023-06-04 NOTE — Progress Notes (Signed)
  Echocardiogram 2D Echocardiogram has been performed.  [name] 06/04/2023, 12:06 PM

## 2023-06-05 ENCOUNTER — Telehealth (HOSPITAL_COMMUNITY): Payer: Self-pay

## 2023-06-05 NOTE — Telephone Encounter (Addendum)
Pt aware, does not want referral to EP and also he stated that the does not the device.   ----- Message from Jacklynn Ganong sent at 06/04/2023  5:12 PM EST ----- EF < 20%, RV normal. Recommend referral to EP for ICD, but looks like from note today this was discussed but he is not interested.   Please call with results

## 2023-06-12 ENCOUNTER — Other Ambulatory Visit (HOSPITAL_COMMUNITY): Payer: Self-pay | Admitting: Family Medicine

## 2023-06-17 ENCOUNTER — Other Ambulatory Visit (HOSPITAL_COMMUNITY): Payer: Self-pay | Admitting: Cardiology

## 2023-06-17 MED ORDER — DIGOXIN 125 MCG PO TABS: 0.1250 mg | ORAL_TABLET | Freq: Every day | ORAL | 3 refills | Status: DC

## 2023-06-17 MED ORDER — MEXILETINE HCL 200 MG PO CAPS: 200.0000 mg | ORAL_CAPSULE | Freq: Two times a day (BID) | ORAL | 3 refills | Status: DC

## 2023-09-01 ENCOUNTER — Encounter (HOSPITAL_COMMUNITY): Payer: Self-pay

## 2023-09-01 ENCOUNTER — Emergency Department (HOSPITAL_COMMUNITY): Payer: PRIVATE HEALTH INSURANCE

## 2023-09-01 ENCOUNTER — Emergency Department (EMERGENCY_DEPARTMENT_HOSPITAL): Payer: PRIVATE HEALTH INSURANCE | Admitting: Certified Registered Nurse Anesthetist

## 2023-09-01 ENCOUNTER — Emergency Department (HOSPITAL_COMMUNITY): Payer: PRIVATE HEALTH INSURANCE | Admitting: Certified Registered Nurse Anesthetist

## 2023-09-01 ENCOUNTER — Encounter (HOSPITAL_COMMUNITY)
Admission: EM | Disposition: A | Discharge status: Home / Self Care | Payer: Self-pay | Source: Home / Self Care | Attending: Emergency Medicine

## 2023-09-01 ENCOUNTER — Other Ambulatory Visit: Payer: Self-pay

## 2023-09-01 ENCOUNTER — Ambulatory Visit (HOSPITAL_COMMUNITY)
Admission: EM | Admit: 2023-09-01 | Discharge: 2023-09-01 | Disposition: A | Discharge status: Home / Self Care | Payer: Medicaid Other | Attending: Orthopedic Surgery | Admitting: Orthopedic Surgery

## 2023-09-01 DIAGNOSIS — I4891 Unspecified atrial fibrillation: Secondary | ICD-10-CM | POA: Diagnosis not present

## 2023-09-01 DIAGNOSIS — I5022 Chronic systolic (congestive) heart failure: Secondary | ICD-10-CM

## 2023-09-01 DIAGNOSIS — S68611A Complete traumatic transphalangeal amputation of left index finger, initial encounter: Secondary | ICD-10-CM | POA: Insufficient documentation

## 2023-09-01 DIAGNOSIS — Z23 Encounter for immunization: Secondary | ICD-10-CM | POA: Diagnosis not present

## 2023-09-01 DIAGNOSIS — I509 Heart failure, unspecified: Secondary | ICD-10-CM | POA: Insufficient documentation

## 2023-09-01 DIAGNOSIS — S68111A Complete traumatic metacarpophalangeal amputation of left index finger, initial encounter: Secondary | ICD-10-CM | POA: Diagnosis not present

## 2023-09-01 DIAGNOSIS — S68621A Partial traumatic transphalangeal amputation of left index finger, initial encounter: Secondary | ICD-10-CM | POA: Diagnosis not present

## 2023-09-01 DIAGNOSIS — E66813 Obesity, class 3: Secondary | ICD-10-CM | POA: Diagnosis not present

## 2023-09-01 DIAGNOSIS — R001 Bradycardia, unspecified: Secondary | ICD-10-CM | POA: Diagnosis not present

## 2023-09-01 DIAGNOSIS — Z6835 Body mass index (BMI) 35.0-35.9, adult: Secondary | ICD-10-CM | POA: Insufficient documentation

## 2023-09-01 DIAGNOSIS — Z7901 Long term (current) use of anticoagulants: Secondary | ICD-10-CM | POA: Insufficient documentation

## 2023-09-01 DIAGNOSIS — S60941A Unspecified superficial injury of left index finger, initial encounter: Secondary | ICD-10-CM | POA: Diagnosis present

## 2023-09-01 DIAGNOSIS — W231XXA Caught, crushed, jammed, or pinched between stationary objects, initial encounter: Secondary | ICD-10-CM | POA: Insufficient documentation

## 2023-09-01 DIAGNOSIS — S68119A Complete traumatic metacarpophalangeal amputation of unspecified finger, initial encounter: Secondary | ICD-10-CM

## 2023-09-01 HISTORY — PX: AMPUTATION: SHX166

## 2023-09-01 LAB — CBC WITH DIFFERENTIAL/PLATELET
Abs Immature Granulocytes: 0.04 10*3/uL (ref 0.00–0.07)
Basophils Absolute: 0 10*3/uL (ref 0.0–0.1)
Basophils Relative: 1 %
Eosinophils Absolute: 0.2 10*3/uL (ref 0.0–0.5)
Eosinophils Relative: 5 %
HCT: 47.6 % (ref 39.0–52.0)
Hemoglobin: 16 g/dL (ref 13.0–17.0)
Immature Granulocytes: 1 %
Lymphocytes Relative: 15 %
Lymphs Abs: 0.6 10*3/uL — ABNORMAL LOW (ref 0.7–4.0)
MCH: 31.3 pg (ref 26.0–34.0)
MCHC: 33.6 g/dL (ref 30.0–36.0)
MCV: 93.2 fL (ref 80.0–100.0)
Monocytes Absolute: 0.6 10*3/uL (ref 0.1–1.0)
Monocytes Relative: 14 %
Neutro Abs: 2.6 10*3/uL (ref 1.7–7.7)
Neutrophils Relative %: 64 %
Platelets: 320 10*3/uL (ref 150–400)
RBC: 5.11 MIL/uL (ref 4.22–5.81)
RDW: 17.2 % — ABNORMAL HIGH (ref 11.5–15.5)
WBC: 4 10*3/uL (ref 4.0–10.5)
nRBC: 0 % (ref 0.0–0.2)

## 2023-09-01 LAB — APTT: aPTT: 33 s (ref 24–36)

## 2023-09-01 LAB — BASIC METABOLIC PANEL
Anion gap: 13 (ref 5–15)
BUN: 14 mg/dL (ref 6–20)
CO2: 28 mmol/L (ref 22–32)
Calcium: 9.6 mg/dL (ref 8.9–10.3)
Chloride: 97 mmol/L — ABNORMAL LOW (ref 98–111)
Creatinine, Ser: 1.5 mg/dL — ABNORMAL HIGH (ref 0.61–1.24)
GFR, Estimated: 58 mL/min — ABNORMAL LOW (ref >60)
Glucose, Bld: 118 mg/dL — ABNORMAL HIGH (ref 70–99)
Potassium: 4.8 mmol/L (ref 3.5–5.1)
Sodium: 138 mmol/L (ref 135–145)

## 2023-09-01 LAB — PROTIME-INR
INR: 1.2 (ref 0.8–1.2)
Prothrombin Time: 15.8 s — ABNORMAL HIGH (ref 11.4–15.2)

## 2023-09-01 LAB — MRSA NEXT GEN BY PCR, NASAL: MRSA by PCR Next Gen: NOT DETECTED

## 2023-09-01 LAB — ABO/RH: ABO/RH(D): O POS

## 2023-09-01 SURGERY — AMPUTATION DIGIT
Anesthesia: Monitor Anesthesia Care | Site: Hand | Laterality: Left

## 2023-09-01 MED ORDER — CHLORHEXIDINE GLUCONATE 4 % EX SOLN
60.0000 mL | Freq: Once | CUTANEOUS | Status: DC
  Filled 2023-09-01: qty 60

## 2023-09-01 MED ORDER — CEFAZOLIN SODIUM-DEXTROSE 3-4 GM/150ML-% IV SOLN
3.0000 g | INTRAVENOUS | Status: AC
  Administered 2023-09-01: 3 g via INTRAVENOUS
  Filled 2023-09-01: qty 150

## 2023-09-01 MED ORDER — PROPOFOL 10 MG/ML IV BOLUS
INTRAVENOUS | Status: DC | PRN
  Administered 2023-09-01: 40 mg via INTRAVENOUS

## 2023-09-01 MED ORDER — POVIDONE-IODINE 10 % EX SWAB
2.0000 | Freq: Once | CUTANEOUS | Status: AC
  Administered 2023-09-01: 2 via TOPICAL

## 2023-09-01 MED ORDER — OXYCODONE HCL 5 MG/5ML PO SOLN: 5.0000 mg | Freq: Once | ORAL | Status: DC | PRN

## 2023-09-01 MED ORDER — LIDOCAINE 2% (20 MG/ML) 5 ML SYRINGE
INTRAMUSCULAR | Status: DC | PRN
  Administered 2023-09-01: 40 mg via INTRAVENOUS

## 2023-09-01 MED ORDER — CHLORHEXIDINE GLUCONATE 0.12 % MT SOLN
15.0000 mL | Freq: Once | OROMUCOSAL | Status: AC
  Filled 2023-09-01: qty 15

## 2023-09-01 MED ORDER — HYDROCODONE-ACETAMINOPHEN 5-325 MG PO TABS: 1.0000 | ORAL_TABLET | Freq: Four times a day (QID) | ORAL | 0 refills | Status: DC | PRN

## 2023-09-01 MED ORDER — SULFAMETHOXAZOLE-TRIMETHOPRIM 800-160 MG PO TABS: 1.0000 | ORAL_TABLET | Freq: Two times a day (BID) | ORAL | 0 refills | Status: DC

## 2023-09-01 MED ORDER — BUPIVACAINE HCL (PF) 0.25 % IJ SOLN
INTRAMUSCULAR | Status: AC
  Filled 2023-09-01: qty 30

## 2023-09-01 MED ORDER — OXYCODONE HCL 5 MG PO TABS: 5.0000 mg | ORAL_TABLET | Freq: Once | ORAL | Status: DC | PRN

## 2023-09-01 MED ORDER — FENTANYL CITRATE (PF) 100 MCG/2ML IJ SOLN: 25.0000 ug | INTRAMUSCULAR | Status: DC | PRN

## 2023-09-01 MED ORDER — MIDAZOLAM HCL 2 MG/2ML IJ SOLN
INTRAMUSCULAR | Status: AC
  Filled 2023-09-01: qty 2

## 2023-09-01 MED ORDER — ALBUMIN HUMAN 5 % IV SOLN: INTRAVENOUS | Status: DC | PRN

## 2023-09-01 MED ORDER — FENTANYL CITRATE (PF) 100 MCG/2ML IJ SOLN
INTRAMUSCULAR | Status: AC
  Filled 2023-09-01: qty 2

## 2023-09-01 MED ORDER — ONDANSETRON HCL 4 MG/2ML IJ SOLN: 4.0000 mg | Freq: Four times a day (QID) | INTRAMUSCULAR | Status: DC | PRN

## 2023-09-01 MED ORDER — CHLORHEXIDINE GLUCONATE 0.12 % MT SOLN
OROMUCOSAL | Status: AC
  Administered 2023-09-01: 15 mL via OROMUCOSAL
  Filled 2023-09-01: qty 15

## 2023-09-01 MED ORDER — 0.9 % SODIUM CHLORIDE (POUR BTL) OPTIME
TOPICAL | Status: DC | PRN
  Administered 2023-09-01: 1000 mL

## 2023-09-01 MED ORDER — SODIUM CHLORIDE 0.9 % IV SOLN
3.0000 g | Freq: Once | INTRAVENOUS | Status: AC
  Administered 2023-09-01: 3 g via INTRAVENOUS
  Filled 2023-09-01: qty 8

## 2023-09-01 MED ORDER — ROPIVACAINE HCL 5 MG/ML IJ SOLN
INTRAMUSCULAR | Status: DC | PRN
  Administered 2023-09-01: 30 mL via PERINEURAL

## 2023-09-01 MED ORDER — MIDAZOLAM HCL 2 MG/2ML IJ SOLN
2.0000 mg | Freq: Once | INTRAMUSCULAR | Status: AC
  Administered 2023-09-01: 2 mg via INTRAVENOUS

## 2023-09-01 MED ORDER — PROPOFOL 500 MG/50ML IV EMUL
INTRAVENOUS | Status: DC | PRN
  Administered 2023-09-01: 50 ug/kg/min via INTRAVENOUS

## 2023-09-01 MED ORDER — TETANUS-DIPHTH-ACELL PERTUSSIS 5-2.5-18.5 LF-MCG/0.5 IM SUSY
0.5000 mL | PREFILLED_SYRINGE | Freq: Once | INTRAMUSCULAR | Status: AC
  Administered 2023-09-01: 0.5 mL via INTRAMUSCULAR
  Filled 2023-09-01: qty 0.5

## 2023-09-01 MED ORDER — ORAL CARE MOUTH RINSE: 15.0000 mL | Freq: Once | OROMUCOSAL | Status: AC

## 2023-09-01 MED ORDER — FENTANYL CITRATE (PF) 250 MCG/5ML IJ SOLN
INTRAMUSCULAR | Status: AC
  Filled 2023-09-01: qty 5

## 2023-09-01 MED ORDER — CEFAZOLIN SODIUM-DEXTROSE 3-4 GM/150ML-% IV SOLN
INTRAVENOUS | Status: AC
  Filled 2023-09-01: qty 150

## 2023-09-01 MED ORDER — PROPOFOL 10 MG/ML IV BOLUS
INTRAVENOUS | Status: AC
  Filled 2023-09-01: qty 20

## 2023-09-01 MED ORDER — LACTATED RINGERS IV SOLN
INTRAVENOUS | Status: DC
Start: 2023-09-01 — End: 2023-09-02

## 2023-09-01 SURGICAL SUPPLY — 32 items
BAG COUNTER SPONGE SURGICOUNT (BAG) ×2 IMPLANT
BLADE LONG MED 31X9 (MISCELLANEOUS) ×2 IMPLANT
BNDG COHESIVE 1X5 TAN STRL LF (GAUZE/BANDAGES/DRESSINGS) ×2 IMPLANT
BNDG ESMARK 4X9 LF (GAUZE/BANDAGES/DRESSINGS) ×2 IMPLANT
BNDG GAUZE DERMACEA FLUFF 4 (GAUZE/BANDAGES/DRESSINGS) IMPLANT
CHLORAPREP W/TINT 26 (MISCELLANEOUS) ×2 IMPLANT
CORD BIPOLAR FORCEPS 12FT (ELECTRODE) ×2 IMPLANT
CUFF TOURN SGL QUICK 18X4 (TOURNIQUET CUFF) ×2 IMPLANT
CUFF TRNQT CYL 24X4X16.5-23 (TOURNIQUET CUFF) IMPLANT
GAUZE SPONGE 4X4 12PLY STRL (GAUZE/BANDAGES/DRESSINGS) IMPLANT
GAUZE XEROFORM 1X8 LF (GAUZE/BANDAGES/DRESSINGS) ×2 IMPLANT
GLOVE BIO SURGEON STRL SZ7.5 (GLOVE) ×2 IMPLANT
GLOVE BIOGEL PI IND STRL 8 (GLOVE) ×2 IMPLANT
GOWN STRL REUS W/ TWL LRG LVL3 (GOWN DISPOSABLE) ×2 IMPLANT
GOWN STRL REUS W/ TWL XL LVL3 (GOWN DISPOSABLE) ×2 IMPLANT
KIT BASIN OR (CUSTOM PROCEDURE TRAY) ×2 IMPLANT
KIT TURNOVER KIT B (KITS) ×2 IMPLANT
NDL HYPO 25GX1X1/2 BEV (NEEDLE) IMPLANT
NEEDLE HYPO 25GX1X1/2 BEV (NEEDLE) ×1 IMPLANT
NS IRRIG 1000ML POUR BTL (IV SOLUTION) ×2 IMPLANT
PACK ORTHO EXTREMITY (CUSTOM PROCEDURE TRAY) ×2 IMPLANT
PAD ARMBOARD 7.5X6 YLW CONV (MISCELLANEOUS) ×4 IMPLANT
PAD CAST 4YDX4 CTTN HI CHSV (CAST SUPPLIES) IMPLANT
SLING ARM FOAM STRAP XLG (SOFTGOODS) IMPLANT
SPECIMEN JAR SMALL (MISCELLANEOUS) ×2 IMPLANT
SUCTION TUBE FRAZIER 8FR DISP (SUCTIONS) IMPLANT
SUT CHROMIC 6 0 PS 4 (SUTURE) IMPLANT
SUT MON AB 5-0 PS2 18 (SUTURE) IMPLANT
SUT VICRYL 4-0 PS2 18IN ABS (SUTURE) IMPLANT
SYR CONTROL 10ML LL (SYRINGE) IMPLANT
TOWEL GREEN STERILE (TOWEL DISPOSABLE) ×2 IMPLANT
UNDERPAD 30X36 HEAVY ABSORB (UNDERPADS AND DIAPERS) ×2 IMPLANT

## 2023-09-01 NOTE — ED Provider Notes (Cosign Needed)
[place] EMERGENCY DEPARTMENT AT [facility] Provider Note   CSN: [id number] Arrival date & time: 09/01/23  1100     History  Chief Complaint  Patient presents with   Finger Injury   Hand Injury    Dublin Grayer is a 48 y.o. male.  48 year old male presents today for concern of left index finger injury.  He states he was using a cement drill when he got caught on a loose piece of clothing and injured his left index finger.  Unsure of his last tetanus shot.  Outside of pain in his left index finger he denies any other complaints.  He is on Eliquis.  Last dose this morning.  The history is provided by the patient. No language interpreter was used.       Home Medications Prior to Admission medications   Medication Sig Start Date End Date Taking? Authorizing Provider  apixaban (ELIQUIS) 5 MG TABS tablet Take 1 tablet (5 mg total) by mouth 2 (two) times daily. 05/07/23   Andrey Farmer, PA-C  carvedilol (COREG) 6.25 MG tablet Take 1 tablet (6.25 mg total) by mouth 2 (two) times daily with a meal. 05/07/23   Andrey Farmer, PA-C  digoxin (LANOXIN) 0.125 MG tablet Take 1 tablet (0.125 mg total) by mouth daily. 06/17/23   Laurey Morale, MD  empagliflozin (JARDIANCE) 10 MG TABS tablet Take 1 tablet (10 mg total) by mouth daily. 05/07/23   Andrey Farmer, PA-C  mexiletine (MEXITIL) 200 MG capsule Take 1 capsule (200 mg total) by mouth 2 (two) times daily. 06/17/23   Laurey Morale, MD  potassium chloride SA (KLOR-CON M) 20 MEQ tablet Take 2 tablets (40 mEq total) by mouth daily. 05/16/23   Mil[place], Anderson Malta, FNP  sacubitril-valsartan (ENTRESTO) 49-51 MG Take 1 tablet by mouth 2 (two) times daily. 05/16/23   [place], Anderson Malta, FNP  spironolactone (ALDACTONE) 25 MG tablet Take 0.5 tablets (12.5 mg total) by mouth daily. 05/07/23   Andrey Farmer, PA-C  torsemide (DEMADEX) 20 MG tablet Take 2 tablets (40 mg total) by mouth daily. 05/16/23   Jacklynn Ganong, FNP      Allergies    Patient has no known allergies.    Review of Systems   Review of Systems  Constitutional:  Negative for chills and fever.  Skin:  Positive for wound.  All other systems reviewed and are negative.   Physical Exam Updated Vital Signs BP 106/85   Pulse 79   Temp 97.6 F (36.4 C) (Oral)   Resp 16   Ht 6\' 7"  (2.007 m)   Wt (!) 142.9 kg   SpO2 100%   BMI 35.49 kg/m  Physical Exam Vitals and nursing note reviewed.  Constitutional:      General: He is not in acute distress.    Appearance: Normal appearance. He is not ill-appearing.  HENT:     Head: Normocephalic and atraumatic.     Nose: Nose normal.  Eyes:     Conjunctiva/sclera: Conjunctivae normal.  Cardiovascular:     Rate and Rhythm: Normal rate and regular rhythm.  Pulmonary:     Effort: Pulmonary effort is normal. No respiratory distress.  Musculoskeletal:        General: Deformity present.     Cervical back: Normal range of motion.     Comments: Visible amputation of the left index finger just distal to the DIP.  Neurovascularly intact in the left upper extremity.  Otherwise good  range of motion.  Picture attached for description.  No active bleeding.  Skin:    Findings: No rash.  Neurological:     Mental Status: He is alert.     ED Results / Procedures / Treatments   Labs (all labs ordered are listed, but only abnormal results are displayed) Labs Reviewed  CBC WITH DIFFERENTIAL/PLATELET - Abnormal; Notable for the following components:      Result Value   RDW 17.2 (*)    Lymphs Abs 0.6 (*)    All other components within normal limits  PROTIME-INR  APTT  BASIC METABOLIC PANEL  TYPE AND SCREEN  TYPE AND SCREEN    EKG None  Radiology DG Hand Complete Left Result Date: 09/01/2023 CLINICAL DATA:  Status post finger amputation. EXAM: LEFT HAND - COMPLETE 3+ VIEW COMPARISON:  None Available. FINDINGS: There is bandage material overlying the distal aspect of the index  finger. The proximal metaphysis and more distal aspect of the distal phalanx denies finger are absent. The associated distal finger soft tissues are also absent. Mild degenerative spurring at the dorsal base of the distal phalanx of the fifth finger. On frontal view only there is thin linear lucency seen overlying the lateral/radial base of the distal phalanx of the third finger. No overlying soft tissue swelling is seen. This may be chronic. Mild fifth PIP joint space narrowing and peripheral osteophytosis. IMPRESSION: 1. Status post amputation of the distal aspect of the index finger to the level of the base of the distal phalanx. 2. On frontal view only there is thin linear lucency seen overlying the lateral/radial base of the distal phalanx of the third finger. No overlying soft tissue swelling is seen. This may be chronic. Recommend clinical correlation for point tenderness and possible acute nondisplaced fracture. Electronically Signed   By: Neita Garnet M.D.   On: 09/01/2023 12:11    Procedures Procedures    Medications Ordered in ED Medications  Ampicillin-Sulbactam (UNASYN) 3 g in sodium chloride 0.9 % 100 mL IVPB (0 g Intravenous Stopped 09/01/23 1403)    ED Course/ Medical Decision Making/ A&P                                 Medical Decision Making Amount and/or Complexity of Data Reviewed Labs: ordered. Radiology: ordered.   Medical Decision Making / ED Course   This patient presents to the ED for concern of finger amputation, this involves an extensive number of treatment options, and is a complaint that carries with it a high risk of complications and morbidity.  The differential diagnosis includes fracture, amputation, laceration  MDM: 48 year old male presents today for concern of finger amputation while using a cement drill.  See attached picture for description.  Blood work obtained in case he needs to go to operating room.  X-ray obtained.  Tetanus shot updated.   Discussed with Earney Hamburg with hand surgery.  After discussion with Dr. Merlyn Lot they will take patient to the operating room around 3 or 4 PM.  N.p.o. until then.  Patient aware.  No other complaints.  Lab Tests: -I ordered, reviewed, and interpreted labs.   The pertinent results include:   Labs Reviewed  CBC WITH DIFFERENTIAL/PLATELET - Abnormal; Notable for the following components:      Result Value   RDW 17.2 (*)    Lymphs Abs 0.6 (*)    All other components within normal limits  PROTIME-INR  APTT  BASIC METABOLIC PANEL  TYPE AND SCREEN  TYPE AND SCREEN      EKG  EKG Interpretation Date/Time:    Ventricular Rate:    PR Interval:    QRS Duration:    QT Interval:    QTC Calculation:   R Axis:      Text Interpretation:           Imaging Studies ordered: I ordered imaging studies including left index finger x-ray I independently visualized and interpreted imaging. I agree with the radiologist interpretation   Medicines ordered and prescription drug management: Meds ordered this encounter  Medications   Ampicillin-Sulbactam (UNASYN) 3 g in sodium chloride 0.9 % 100 mL IVPB    Antibiotic Indication::   Other Indication (list below)    Other Indication::   open fracture   Tdap (BOOSTRIX) injection 0.5 mL    -I have reviewed the patients home medicines and have made adjustments as needed  Consultations Obtained: I requested consultation with the hand surgery,  and discussed lab and imaging findings as well as pertinent plan - they recommend: As above  Co morbidities that complicate the patient evaluation  Past Medical History:  Diagnosis Date   CHF (congestive heart failure) (HCC)       Dispostion: Discussed with hand.  They will take him to the operating room.  He will stay in the emergency room until then.   Final Clinical Impression(s) / ED Diagnoses Final diagnoses:  Traumatic amputation of finger, initial encounter    Rx / DC Orders ED  Discharge Orders     None         Marita Kansas, PA-C 09/01/23 1417

## 2023-09-01 NOTE — ED Notes (Addendum)
ED phlebotomy to come re-draw bloodwork (multiple specimens (two pink tops, blue top, and light green top) hemolyzed per lab and blood bank).

## 2023-09-01 NOTE — Consult Note (Addendum)
Reason for Consult:Left index finger amputation Referring Physician: Estelle June Time called: 1229 Time at bedside: 1236   Richard [name] is an 48 y.o. male.  HPI: [name] was using a concrete drill to remove concrete from his truck when the bit got caught in his glove and ripped the end of his left index finger off. He was brought to the ED and hand surgery was consulted. 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  Past Medical History:  Diagnosis Date   CHF (congestive heart failure) (HCC)     Past Surgical History:  Procedure Laterality Date   RIGHT/LEFT HEART CATH AND CORONARY ANGIOGRAPHY N/A 06/27/2021   Procedure: RIGHT/LEFT HEART CATH AND CORONARY ANGIOGRAPHY;  Surgeon: Dolores Patty, MD;  Location: [facility] INVASIVE CV LAB;  Service: Cardiovascular;  Laterality: N/A;    Family History  Problem Relation Age of Onset   Other Mother        Neg family history of clotting disorder or heart disease   Clotting disorder Neg Hx    Rheumatologic disease Neg Hx     Social History:  reports that he has never smoked. He has never used smokeless tobacco. He reports current alcohol use. He reports that he does not use drugs.  Allergies: No Known Allergies  Medications: I have reviewed the patient's current medications.  No results found for this or any previous visit (from the past 48 hours).  DG Hand Complete Left Result Date: 09/01/2023 CLINICAL DATA:  Status post finger amputation. EXAM: LEFT HAND - COMPLETE 3+ VIEW COMPARISON:  None Available. FINDINGS: There is bandage material overlying the distal aspect of the index finger. The proximal metaphysis and more distal aspect of the distal phalanx denies finger are absent. The associated distal finger soft tissues are also absent. Mild degenerative spurring at the dorsal base of the distal phalanx of the fifth finger. On frontal view only there is thin linear lucency seen overlying the lateral/radial base of the distal phalanx  of the third finger. No overlying soft tissue swelling is seen. This may be chronic. Mild fifth PIP joint space narrowing and peripheral osteophytosis. IMPRESSION: 1. Status post amputation of the distal aspect of the index finger to the level of the base of the distal phalanx. 2. On frontal view only there is thin linear lucency seen overlying the lateral/radial base of the distal phalanx of the third finger. No overlying soft tissue swelling is seen. This may be chronic. Recommend clinical correlation for point tenderness and possible acute nondisplaced fracture. Electronically Signed   By: Neita Garnet M.D.   On: 09/01/2023 12:11    Review of Systems  HENT:  Negative for ear discharge, ear pain, hearing loss and tinnitus.   Eyes:  Negative for photophobia and pain.  Respiratory:  Negative for cough and shortness of breath.   Cardiovascular:  Negative for chest pain.  Gastrointestinal:  Negative for abdominal pain, nausea and vomiting.  Genitourinary:  Negative for dysuria, flank pain, frequency and urgency.  Musculoskeletal:  Positive for arthralgias (Left index finger). Negative for back pain, myalgias and neck pain.  Neurological:  Negative for dizziness and headaches.  Hematological:  Does not bruise/bleed easily.  Psychiatric/Behavioral:  The patient is not nervous/anxious.    Blood pressure 106/85, pulse 79, temperature 97.6 F (36.4 C), temperature source Oral, resp. rate 16, height 6\' 7"  (2.007 m), weight (!) 142.9 kg, SpO2 100%. Physical Exam Constitutional:      General: He is not in acute  distress.    Appearance: He is well-developed. He is not diaphoretic.  HENT:     Head: Normocephalic and atraumatic.  Eyes:     General: No scleral icterus.       Right eye: No discharge.        Left eye: No discharge.     Conjunctiva/sclera: Conjunctivae normal.  Cardiovascular:     Rate and Rhythm: Normal rate and regular rhythm.  Pulmonary:     Effort: Pulmonary effort is normal. No  respiratory distress.  Musculoskeletal:     Cervical back: Normal range of motion.     Comments: Left shoulder, elbow, wrist, digits- Amputation index finger just distal to DIP joint, mod TTP, no instability, no blocks to motion  Sens  Ax/R/M/U intact  Mot   Ax/ R/ PIN/ M/ AIN/ U intact  Rad 2+  Skin:    General: Skin is warm and dry.  Neurological:     Mental Status: He is alert.  Psychiatric:        Mood and Affect: Mood normal.        Behavior: Behavior normal.     Assessment/Plan: Left index finger amputation -- Plan revision amputation tonight with Dr. Merlyn Lot. Please keep NPO. Anticipate discharge after surgery.    Freeman Caldron, PA-C Orthopedic Surgery [phone] 09/01/2023, 12:48 PM   Addendum (09/01/23): Patient seen and examined.  Agree with above. Patient states he was using a concrete drill at work when his glove caught in the pit and toward the end of his left index finger off.  He was seen in the emergency department.  He reports no previous injury to his finger and no other injury at this time. Exam: Left index finger with amputation through distal phalanx proximal to the nail. A/P: Left index finger amputation.  Recommend revision amputation in OR.  Risks, benefits and alternatives of surgery were discussed including risks of blood loss, infection, damage to nerves/vessels/tendons/ligament/bone, failure of surgery, need for additional surgery, complication with wound healing, stiffness.  He voiced understanding of these risks and elected to proceed.

## 2023-09-01 NOTE — Transfer of Care (Signed)
Immediate Anesthesia Transfer of Care Note  Patient: Richard [name]  Procedure(s) Performed: REVISION AMPUTATION LEFT INDEX FINGER (Left: Hand)  Patient Location: PACU  Anesthesia Type:MAC  Level of Consciousness: awake, alert , and oriented  Airway & Oxygen Therapy: Patient Spontanous Breathing and Patient connected to nasal cannula oxygen  Post-op Assessment: Report given to RN and Post -op Vital signs reviewed and stable  Post vital signs: Reviewed and stable  Last Vitals:  Vitals Value Taken Time  BP 110/81 09/01/23 1935  Temp    Pulse 102 09/01/23 1938  Resp 19 09/01/23 1938  SpO2 96 % 09/01/23 1938  Vitals shown include unfiled device data.  Last Pain:  Vitals:   09/01/23 1527  TempSrc: Oral  PainSc: 3          Complications: No notable events documented.

## 2023-09-01 NOTE — Anesthesia Procedure Notes (Signed)
Anesthesia Regional Block: Supraclavicular block   Pre-Anesthetic Checklist: , timeout performed,  Correct Patient, Correct Site, Correct Laterality,  Correct Procedure, Correct Position, site marked,  Risks and benefits discussed,  Surgical consent,  Pre-op evaluation,  At surgeon's request and post-op pain management  Laterality: Left  Prep: Maximum Sterile Barrier Precautions used, chloraprep       Needles:  Injection technique: Single-shot  Needle Type: Echogenic Needle     Needle Length: 5cm  Needle Gauge: 22     Additional Needles:   Procedures:,,,, ultrasound used (permanent image in chart),,    Narrative:  Start time: 09/01/2023 5:15 PM End time: 09/01/2023 5:20 PM Injection made incrementally with aspirations every 5 mL.  Performed by: Personally  Anesthesiologist: Mariann Barter, MD

## 2023-09-01 NOTE — ED Triage Notes (Signed)
Pt coming in from work where he amputated his left index finger at distal knuckle. Pt has hx of chf so he takes elequis. Pt reporting 5/10 pain when he is not using the hand.    Bp 96 palp  Now 107/79 Pulse 64  rr 18 Spo2 99% ra

## 2023-09-01 NOTE — Progress Notes (Addendum)
Dr. Ace Gins notified that patient is in A Fib. 12 lead EKG was done per MD verbal order. Will continue to monitor.

## 2023-09-01 NOTE — Discharge Instructions (Signed)

## 2023-09-01 NOTE — Anesthesia Preprocedure Evaluation (Addendum)
Anesthesia Evaluation  Patient identified by MRN, date of birth, ID band Patient awake    Reviewed: Allergy & Precautions, NPO status , Patient's Chart, lab work & pertinent test results, reviewed documented beta blocker date and time   History of Anesthesia Complications Negative for: history of anesthetic complications  Airway Mallampati: III  TM Distance: >3 FB     Dental no notable dental hx.    Pulmonary neg COPD, neg PE   breath sounds clear to auscultation       Cardiovascular +CHF   Rhythm:Regular Rate:Normal  EF ~20%   Neuro/Psych neg Seizures    GI/Hepatic ,neg GERD  ,,(+) neg Cirrhosis        Endo/Other  neg diabetes  Class 3 obesity  Renal/GU Renal disease     Musculoskeletal   Abdominal   Peds  Hematology   Anesthesia Other Findings   Reproductive/Obstetrics                             Anesthesia Physical Anesthesia Plan  ASA: 4  Anesthesia Plan: MAC and Regional   Post-op Pain Management: Regional block*   Induction: Intravenous  PONV Risk Score and Plan: 1 and Propofol infusion  Airway Management Planned: Natural Airway and Simple Face Mask  Additional Equipment:   Intra-op Plan:   Post-operative Plan:   Informed Consent: I have reviewed the patients History and Physical, chart, labs and discussed the procedure including the risks, benefits and alternatives for the proposed anesthesia with the patient or authorized representative who has indicated his/her understanding and acceptance.     Dental advisory given  Plan Discussed with: CRNA  Anesthesia Plan Comments:        Anesthesia Quick Evaluation

## 2023-09-01 NOTE — Op Note (Addendum)
NAME: Richard [name] MEDICAL RECORD NO: [id number] DATE OF BIRTH: 09/08/1975 FACILITY: [facility] LOCATION: [facility] OR PHYSICIAN: Tami Ribas, MD   OPERATIVE REPORT   DATE OF PROCEDURE: 09/01/23    PREOPERATIVE DIAGNOSIS: Left index finger amputation   POSTOPERATIVE DIAGNOSIS: Left index finger amputation   PROCEDURE: Left index finger revision amputation through DIP joint   SURGEON:  Betha Loa, M.D.   ASSISTANT: none   ANESTHESIA:  Regional with sedation   INTRAVENOUS FLUIDS:  Per anesthesia flow sheet.   ESTIMATED BLOOD LOSS:  Minimal.   COMPLICATIONS:  None.   SPECIMENS:  none   TOURNIQUET TIME:    Total Tourniquet Time Documented: Upper Arm (Left) - 22 minutes Total: Upper Arm (Left) - 22 minutes    DISPOSITION:  Stable to PACU.   INDICATIONS: 48 year old male states while at work today he was using a drill which caught on his glove and toward the end of his left index finger off.  Was brought to [facility] emergency department for evaluation.  Recommended revision amputation in the OR.  Risks, benefits and alternatives of surgery were discussed including the risks of blood loss, infection, damage to nerves, vessels, tendons, ligaments, bone for surgery, need for additional surgery, complications with wound healing, continued pain, stiffness.  He voiced understanding of these risks and elected to proceed.  OPERATIVE COURSE:  After being identified preoperatively by myself,  the patient and I agreed on the procedure and site of the procedure.  The surgical site was marked.  Surgical consent had been signed. Preoperative IV antibiotic prophylaxis was given. He was transferred to the operating room and placed on the operating table in supine position with the Left upper extremity on an arm board.  Sedation was induced by the anesthesiologist. A regional block had been performed by anesthesia in preoperative holding.    Left upper extremity was prepped and draped in normal  sterile orthopedic fashion.  A surgical pause was performed between the surgeons, anesthesia, and operating room staff and all were in agreement as to the patient, procedure, and site of procedure.  Tourniquet at the proximal aspect of the extremity was inflated to 250 mmHg after exsanguination of the arm with an Esmarch bandage.  The wound was explored.  There is no gross contamination.  There is fracture of the remaining portion of the base of the distal phalanx.  There was small amount of attachment of the flexor tendon though the majority of the tendon no longer attached to bone.  There was a flap of skin at the radial side.  The remaining distal phalanx was removed sharply with a knife.  This allowed the remaining skin flap of skin to cover over the end of the finger.  The germinal matrix appeared to have been avulsed with the amputated portion of the digit.  The wound was copiously irrigated with sterile saline.  It was reapproximated with 5-0 Monocryl suture in interrupted fashion.  There was good contour to the finger and good coverage over the end of the bone.  The wound was then dressed with sterile Xeroform and 4 x 4 and wrapped with a Coban dressing lightly.  AlumaFoam splint was placed and wrapped lightly with Coban dressing.  The tourniquet was deflated at 22 minutes.  Fingertips were pink with brisk capillary refill after deflation of tourniquet.  The operative  drapes were broken down.  The patient was awoken from anesthesia safely.  He was transferred back to the stretcher and taken to  PACU in stable condition.  I will see him back in the office in 1 week for postoperative followup.  I will give him a prescription for Norco 5/325 1-2 tabs PO q6 hours prn pain, dispense # 20 and Bactrim DS 1 p.o. twice daily x 7 days.   Betha Loa, MD Electronically signed, 09/01/23

## 2023-09-01 NOTE — ED Triage Notes (Signed)
Pt amputated finger has been on ice starting at 1019

## 2023-09-02 ENCOUNTER — Encounter (HOSPITAL_COMMUNITY): Payer: Self-pay | Admitting: Orthopedic Surgery

## 2023-09-02 LAB — TYPE AND SCREEN: ABO/RH(D): O POS

## 2023-09-03 ENCOUNTER — Encounter (HOSPITAL_COMMUNITY): Payer: Self-pay | Admitting: Orthopedic Surgery

## 2023-09-03 LAB — TYPE AND SCREEN
ABO/RH(D): O POS
Antibody Screen: NEGATIVE
Unit division: 0

## 2023-09-03 LAB — BPAM RBC
Blood Product Expiration Date: 202503082359
ISSUE DATE / TIME: 202502030420
Unit Type and Rh: 5100

## 2023-09-05 ENCOUNTER — Encounter (HOSPITAL_COMMUNITY): Payer: Medicaid Other | Admitting: Internal Medicine

## 2023-10-23 ENCOUNTER — Telehealth (HOSPITAL_COMMUNITY): Payer: Self-pay

## 2023-10-23 NOTE — Telephone Encounter (Signed)
 Called to confirm/remind patient of their appointment at [facility] on 10/24/23. However, pt rescheduled his appointment.

## 2023-10-24 ENCOUNTER — Telehealth (HOSPITAL_COMMUNITY): Payer: Self-pay | Admitting: Internal Medicine

## 2023-10-24 ENCOUNTER — Encounter (HOSPITAL_COMMUNITY): Payer: Medicaid Other

## 2023-10-24 NOTE — Telephone Encounter (Signed)
 Called to confirm/remind patient of their appointment at the [facility] on 10/24/23.   Appointment:   [] Confirmed  [] Left mess   [x] No answer/No voice mail  [] Phone not in service  Patient reminded to bring all medications and/or complete list.  Confirmed patient has transportation. Gave directions, instructed to utilize valet parking.

## 2023-10-26 NOTE — Progress Notes (Incomplete)
 Advanced Heart Failure Clinic Note   PCP: [facility] in [facility] HF Cardiologist: Dr Gala Romney  Chief complaint: Heart failure  HPI: Richard [name] is a 48 y.o. obese male with bilateral PEs and systolic HF diagnosed in 2017 with EF 10-15%  Echo 05/2016: EF 10-15%. RV dilated LV thrombus    Echo 11/20/16 EF 25-30% Probable small laminated LV thrombus.    Admitted 06/24/21 w/ a/c systolic CHF, w/ marked fluid overload  Echo EF < 20%, RV moderately reduced, no LV thrombus. R/LHC-> severe NICM, EF 15%, normal cors, mildly elevated filling pressures w/ preserved CO. D/c wt 278 lb.   Echo 09/20/21: LVEF 20% RV moderately reduced  No LV thrombus   Follow up 05/07/23, volume overloaded ReDs 43%. Cleda Daub restarted, continued on torsemide 40 daily.   Echo 11/24 EF < 20% RV ok -> ICD and advanced therapies d/w HF NP. Not interested. Also not interested in sleep study.   Here for f/u. Overall feeling ok. Torsemide script runs out every 15 days so when he runs out he gets fluid overloaded and SOB. Feels fine when taking pills. No problems with other meds. Accidentally cut his left index finger off in February. Now on light duty (drives a concrete truck)    FH: Negative for heart disease SH: No alcohol or drug use.   Review of systems complete and found to be negative unless listed in HPI.    SH:  Social History   Socioeconomic History   Marital status: Single    Spouse name: Not on file   Number of children: Not on file   Years of education: Not on file   Highest education level: Not on file  Occupational History   Not on file  Tobacco Use   Smoking status: Never   Smokeless tobacco: Never  Substance and Sexual Activity   Alcohol use: Yes    Comment: rare   Drug use: No   Sexual activity: Not on file  Other Topics Concern   Not on file  Social History Narrative   Not on file   Social Drivers of Health   Financial Resource Strain: Not on file  Food Insecurity:  Low Risk  (10/06/2023)   Received from [facility]   Hunger Vital Sign    Worried About Running Out of Food in the Last Year: Never true    Ran Out of Food in the Last Year: Never true  Transportation Needs: No Transportation Needs (10/06/2023)   Received from [facility]    In the past 12 months, has lack of reliable transportation kept you from medical appointments, meetings, work or from getting things needed for daily living? : No  Physical Activity: Not on file  Stress: Not on file  Social Connections: Unknown (12/11/2021)   Received from [facility], [facility]   Social Network    Social Network: Not on file  Intimate Partner Violence: Unknown (11/02/2021)   Received from [facility], [facility]   HITS    Physically Hurt: Not on file    Insult or Talk Down To: Not on file    Threaten Physical Harm: Not on file    Scream or Curse: Not on file   FH:  Family History  Problem Relation Age of Onset   Other Mother        Neg family history of clotting disorder or heart disease   Clotting disorder Neg Hx    Rheumatologic disease Neg  Hx    Past Medical History:  Diagnosis Date   CHF (congestive heart failure) (HCC)    Current Outpatient Medications  Medication Sig Dispense Refill   apixaban (ELIQUIS) 5 MG TABS tablet Take 1 tablet (5 mg total) by mouth 2 (two) times daily. 180 tablet 3   carvedilol (COREG) 6.25 MG tablet Take 1 tablet (6.25 mg total) by mouth 2 (two) times daily with a meal. 60 tablet 5   digoxin (LANOXIN) 0.125 MG tablet Take 1 tablet (0.125 mg total) by mouth daily. 30 tablet 3   empagliflozin (JARDIANCE) 10 MG TABS tablet Take 1 tablet (10 mg total) by mouth daily. 30 tablet 5   mexiletine (MEXITIL) 200 MG capsule Take 1 capsule (200 mg total) by mouth 2 (two) times daily. 60 capsule 3   potassium chloride SA (KLOR-CON M) 20 MEQ tablet Take 2 tablets (40 mEq total) by mouth daily. 30 tablet 6   sacubitril-valsartan (ENTRESTO)  49-51 MG Take 0.5 tablets by mouth 2 (two) times daily.     spironolactone (ALDACTONE) 25 MG tablet Take 12.5 mg by mouth every other day.     torsemide (DEMADEX) 20 MG tablet Take 2 tablets (40 mg total) by mouth daily. 30 tablet 6   No current facility-administered medications for this encounter.   BP 110/70   Pulse 83   Wt (!) 142.2 kg (313 lb 6.4 oz)   SpO2 98%   BMI 35.31 kg/m   Wt Readings from Last 3 Encounters:  10/27/23 (!) 142.2 kg (313 lb 6.4 oz)  09/01/23 (!) 142.9 kg (315 lb)  06/04/23 (!) 138.2 kg (304 lb 9.6 oz)   PHYSICAL EXAM: General:  Well appearing. No resp difficulty HEENT: normal Neck: supple. JVP hard to see. Carotids 2+ bilat; no bruits. No lymphadenopathy or thryomegaly appreciated. Cor: PMI nondisplaced. Irregular rate & rhythm. No rubs, gallops or murmurs. Lungs: clear Abdomen: soft, nontender, nondistended. No hepatosplenomegaly. No bruits or masses. Good bowel sounds. Extremities: no cyanosis, clubbing, rash, edema Neuro: alert & orientedx3, cranial nerves grossly intact. moves all 4 extremities w/o difficulty. Affect pleasant  ReDS 46%  ECG AF 96 Personally reviewed   ASSESSMENT & PLAN: 1. Chronic Systolic Heart Failure/NICM - Echo 11/17: EF 10-15%. With LV thrombus. 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  - Echo 4/18: EF 25-30% laminated LV thrombus. Cannot exclude non-compaction.  - Echo 8/22: EF < 20%, RV okay, trivial MR - Echo 11/22: EF < 20%, RV moderately reduced  - R/LHC 11/22: severe NICM, EF 15%, normal cors, mildly elevated filling pressures w/ preserved CO and mild pulmonary venous HTN.  - CPX 1/23: Moderate HF impairment, limitations also related to body habitus and deconditioning. - Echo 2/23: EF < 20%, RV moderately reduced - Zio 3/23: 10% PVC burden - Echo 11/24 EF < 20% RV ok  - Improved NYHA I-II. Volume up with ReDS 46% (recently ran out of torsemide but restarted) - Will give metolazone 2.5 x 1 + K 40. Recheck with NP 2  weeks. If volume still up increase torsemide to 60 bid - Continue torsemide 40 mg bid - Continue Entresto 49/51 mg bid. BP too soft for titration   - Continue spiro 12.5 mg daily. - Increase carvedilol to 9.375 mg bid.  - Continue Jardiance 10 mg daily. - Continue digoxin 0.125 mg daily.  - QRS narrow, not a candidate for CRT.  - Has refused ICD or w/u for advanced therapies - Proceed with CMRI to further evaluate cardiomyopathy  2. Bilateral PE  - Noted on CTA 11/17.  - Continue Eliquis 5 mg bid. No bleeding issues. - Based on Amplify-EXT data can consider dropping to 2.5 bid but given size, elected not to drop dose. - Check labs today  3. Large LV Thrombus - Noted on Echo on 05/2016. - Not on echos today - On Eliquis. Given severe LV dysfunction and previous PEs would continue   4. NSVT/PVCs - Not interested in ICD - 10% PVC burden on Zio 2/23 - Continue mexiletine 200 mg bid - Has refused sleep study - Can repeat Zio as needed to requantify burden  5. Atrial fibrillation - new onset (duration unclear) - will need TEE & DC-CV (says he missed several doses of Eliquis) - will need sleep study but has refused - if has ERAF will need to consider switching mexilitene to amio    Arvilla Meres, MD  8:58 AM

## 2023-10-27 ENCOUNTER — Ambulatory Visit (HOSPITAL_COMMUNITY)
Admission: RE | Admit: 2023-10-27 | Discharge: 2023-10-27 | Disposition: A | Discharge status: Home / Self Care | Source: Ambulatory Visit | Attending: Internal Medicine | Admitting: Internal Medicine

## 2023-10-27 ENCOUNTER — Other Ambulatory Visit (HOSPITAL_COMMUNITY): Payer: Self-pay | Admitting: *Deleted

## 2023-10-27 ENCOUNTER — Encounter (HOSPITAL_COMMUNITY): Payer: Self-pay | Admitting: Internal Medicine

## 2023-10-27 VITALS — BP 110/70 | HR 83 | Wt 313.4 lb

## 2023-10-27 DIAGNOSIS — I480 Paroxysmal atrial fibrillation: Secondary | ICD-10-CM | POA: Diagnosis not present

## 2023-10-27 DIAGNOSIS — Z6835 Body mass index (BMI) 35.0-35.9, adult: Secondary | ICD-10-CM | POA: Insufficient documentation

## 2023-10-27 DIAGNOSIS — I513 Intracardiac thrombosis, not elsewhere classified: Secondary | ICD-10-CM

## 2023-10-27 DIAGNOSIS — E669 Obesity, unspecified: Secondary | ICD-10-CM | POA: Insufficient documentation

## 2023-10-27 DIAGNOSIS — I4891 Unspecified atrial fibrillation: Secondary | ICD-10-CM | POA: Diagnosis not present

## 2023-10-27 DIAGNOSIS — I2699 Other pulmonary embolism without acute cor pulmonale: Secondary | ICD-10-CM | POA: Diagnosis not present

## 2023-10-27 DIAGNOSIS — I110 Hypertensive heart disease with heart failure: Secondary | ICD-10-CM | POA: Diagnosis not present

## 2023-10-27 DIAGNOSIS — Z86711 Personal history of pulmonary embolism: Secondary | ICD-10-CM | POA: Insufficient documentation

## 2023-10-27 DIAGNOSIS — Z7901 Long term (current) use of anticoagulants: Secondary | ICD-10-CM | POA: Insufficient documentation

## 2023-10-27 DIAGNOSIS — Z7984 Long term (current) use of oral hypoglycemic drugs: Secondary | ICD-10-CM | POA: Diagnosis not present

## 2023-10-27 DIAGNOSIS — I472 Ventricular tachycardia, unspecified: Secondary | ICD-10-CM | POA: Insufficient documentation

## 2023-10-27 DIAGNOSIS — Z79899 Other long term (current) drug therapy: Secondary | ICD-10-CM | POA: Diagnosis not present

## 2023-10-27 DIAGNOSIS — I428 Other cardiomyopathies: Secondary | ICD-10-CM | POA: Diagnosis not present

## 2023-10-27 DIAGNOSIS — I5022 Chronic systolic (congestive) heart failure: Secondary | ICD-10-CM | POA: Insufficient documentation

## 2023-10-27 DIAGNOSIS — I493 Ventricular premature depolarization: Secondary | ICD-10-CM | POA: Diagnosis not present

## 2023-10-27 LAB — COMPREHENSIVE METABOLIC PANEL WITH GFR
ALT: 12 U/L (ref 0–44)
AST: 17 U/L (ref 15–41)
Albumin: 3.4 g/dL — ABNORMAL LOW (ref 3.5–5.0)
Alkaline Phosphatase: 53 U/L (ref 38–126)
Anion gap: 13 (ref 5–15)
BUN: 15 mg/dL (ref 6–20)
CO2: 27 mmol/L (ref 22–32)
Calcium: 9.3 mg/dL (ref 8.9–10.3)
Chloride: 99 mmol/L (ref 98–111)
Creatinine, Ser: 1.55 mg/dL — ABNORMAL HIGH (ref 0.61–1.24)
GFR, Estimated: 55 mL/min — ABNORMAL LOW (ref >60)
Glucose, Bld: 145 mg/dL — ABNORMAL HIGH (ref 70–99)
Potassium: 4.6 mmol/L (ref 3.5–5.1)
Sodium: 139 mmol/L (ref 135–145)
Total Bilirubin: 0.9 mg/dL (ref 0.0–1.2)
Total Protein: 7.6 g/dL (ref 6.5–8.1)

## 2023-10-27 LAB — CBC
HCT: 47.5 % (ref 39.0–52.0)
Hemoglobin: 16.1 g/dL (ref 13.0–17.0)
MCH: 31.4 pg (ref 26.0–34.0)
MCHC: 33.9 g/dL (ref 30.0–36.0)
MCV: 92.8 fL (ref 80.0–100.0)
Platelets: 292 10*3/uL (ref 150–400)
RBC: 5.12 MIL/uL (ref 4.22–5.81)
RDW: 14.3 % (ref 11.5–15.5)
WBC: 3.7 10*3/uL — ABNORMAL LOW (ref 4.0–10.5)
nRBC: 0 % (ref 0.0–0.2)

## 2023-10-27 LAB — BRAIN NATRIURETIC PEPTIDE: B Natriuretic Peptide: 771.8 pg/mL — ABNORMAL HIGH (ref 0.0–100.0)

## 2023-10-27 LAB — DIGOXIN LEVEL: Digoxin Level: 0.6 ng/mL — ABNORMAL LOW (ref 0.8–2.0)

## 2023-10-27 MED ORDER — CARVEDILOL 6.25 MG PO TABS: 9.3750 mg | ORAL_TABLET | Freq: Two times a day (BID) | ORAL | 3 refills | Status: DC

## 2023-10-27 MED ORDER — TORSEMIDE 20 MG PO TABS: 40.0000 mg | ORAL_TABLET | Freq: Two times a day (BID) | ORAL | 2 refills | Status: DC

## 2023-10-27 MED ORDER — METOLAZONE 2.5 MG PO TABS: 2.5000 mg | ORAL_TABLET | Freq: Once | ORAL | 0 refills | Status: DC

## 2023-10-27 MED ORDER — POTASSIUM CHLORIDE CRYS ER 20 MEQ PO TBCR
40.0000 meq | EXTENDED_RELEASE_TABLET | Freq: Every day | ORAL | 2 refills | Status: DC
Start: 2023-10-27 — End: 2024-01-15

## 2023-10-27 NOTE — Addendum Note (Signed)
 Encounter addended by: Dolores Patty, MD on: 10/27/2023 9:54 AM  Actions taken: Level of Service modified, Visit diagnoses modified, Clinical Note Signed

## 2023-10-27 NOTE — Progress Notes (Signed)
 ReDS Vest / Clip - 10/27/23 0910       ReDS Vest / Clip   Station Marker D    Ruler Value 33    ReDS Value Range High volume overload    ReDS Actual Value 46

## 2023-10-27 NOTE — Addendum Note (Signed)
 Encounter addended by: Noralee Space, RN on: 10/27/2023 9:59 AM  Actions taken: Letter saved, Pend clinical note, Clinical Note Signed

## 2023-10-27 NOTE — Patient Instructions (Signed)
 Great to see you today!!!  Medication Changes:  INCREASE Torsemide to 40 mg (2 tabs) Twice daily   INCREASE Potassium to 40 meq (2 tabs) Daily  INCREASE Carvedilol to 9.375 mg (1 & 1/2 tabs) Twice daily    TODAY TAKE ONE DOSE OF METOLAZONE 2.5 (TAKE 30 MINS BEFORE TORSEMIDE) WITH AN EXTRA (2) TABLETS OF POTASSIUM   Lab Work:  Labs done today, your results will be available in MyChart, we will contact you for abnormal readings.   Testing/Procedures:  Your physician has requested that you have a cardiac MRI. Cardiac MRI uses a computer to create images of your heart as its beating, producing both still and moving pictures of your heart and major blood vessels. For further information please see information and instructions below.  Special Instructions // Education:  Do the following things EVERYDAY: Weigh yourself in the morning before breakfast. Write it down and keep it in a log. Take your medicines as prescribed Eat low salt foods--Limit salt (sodium) to 2000 mg per day.  Stay as active as you can everyday Limit all fluids for the day to less than 2 liters       Dear Richard [name]  You are scheduled for a TEE (Transesophageal Echocardiogram) Guided Cardioversion on Friday, April 25 with Dr. Gala Romney.    Please arrive at the [facility] (Main Entrance A) at [facility] [address] [place], [place] [address] at 7:30 AM (This time is 1 hour(s) before your procedure to ensure your preparation).   Free valet parking service is available. You will check in at ADMITTING.   *Please Note: You will receive a call the day before your procedure to confirm the appointment time. That time may have changed from the original time based on the schedule for that day.*    DIET:  Nothing to eat or drink after midnight except a sip of water with medications (see medication instructions below)  MEDICATION INSTRUCTIONS: !!IF ANY NEW MEDICATIONS ARE STARTED AFTER TODAY,  PLEASE NOTIFY YOUR PROVIDER AS SOON AS POSSIBLE!!  FYI: Medications such as Semaglutide (Ozempic, [place]), Tirzepatide (Mounjaro, Zepbound), Dulaglutide (Trulicity), etc ("GLP1 agonists") AND Canagliflozin (Invokana), Dapagliflozin (Farxiga), Empagliflozin (Jardiance), Ertugliflozin (Steglatro), Bexagliflozin [employer]) or any combination with one of these drugs such as Invokamet (Canagliflozin/Metformin), Synjardy (Empagliflozin/Metformin), etc ("SGLT2 inhibitors") must be held around the time of a procedure. This is not a comprehensive list of all of these drugs. Please review all of your medications and talk to your provider if you take any one of these. If you are not sure, ask your provider.   HOLD: Empagliflozin (Jardiance) for 3 days prior to the procedure. Last dose on Monday, April 21.   Continue taking your anticoagulant (blood thinner): Apixaban (Eliquis). DO NOT MISS ANY DOSES  FRIDAY 4/25 AM DO NOT TAKE: Jardiance, Torsemide, and Spironolactone   LABS: DONE TODAY    FYI:  For your safety, and to allow [place] to monitor your vital signs accurately during the surgery/procedure we request: If you have artificial nails, gel coating, SNS etc, please have those removed prior to your surgery/procedure. Not having the nail coverings /polish removed may result in cancellation or delay of your surgery/procedure.  Your support person will be asked to wait in the waiting room during your procedure.  It is OK to have someone drop you off and come back when you are ready to be discharged.  You cannot drive after the procedure and will need someone to drive you home.  Bring your insurance cards.  *Special Note: Every effort is made to have your procedure done on time. Occasionally there are emergencies that occur at the hospital that may cause delays. Please be patient if a delay does occur.       You are scheduled for Cardiac MRI at the location below.  Please arrive for your appointment at  ______________ . ?  [facility] [address] [place], [place] [address] Please take advantage of the free valet parking available at the [facility] [employer] (Entrance C).  Proceed to the [facility] Radiology Department (First Floor) for check-in.    Magnetic resonance imaging (MRI) is a painless test that produces images of the inside of the body without using Xrays.  During an MRI, strong magnets and radio waves work together in a Data processing manager to form detailed images.   MRI images may provide more details about a medical condition than X-rays, CT scans, and ultrasounds can provide.  You may be given earphones to listen for instructions.  You may eat a light breakfast and take medications as ordered with the exception of furosemide, hydrochlorothiazide, chlorthalidone or spironolactone (or any other fluid pill). If you are undergoing a stress MRI, please avoid stimulants for 12 hr prior to test. (I.e. Caffeine, nicotine, chocolate, or antihistamine medications)  If your provider has ordered anti-anxiety medications for this test, then you will need a driver.  An IV will be inserted into one of your veins. Contrast material will be injected into your IV. It will leave your body through your urine within a day. You may be told to drink plenty of fluids to help flush the contrast material out of your system.  You will be asked to remove all metal, including: Watch, jewelry, and other metal objects including hearing aids, hair pieces and dentures. Also wearable glucose monitoring systems (ie. Freestyle Libre and Omnipods) (Braces and fillings normally are not a problem.)   TEST WILL TAKE APPROXIMATELY 1 HOUR  PLEASE NOTIFY SCHEDULING AT LEAST 24 HOURS IN ADVANCE IF YOU ARE UNABLE TO KEEP YOUR APPOINTMENT. [phone]  For more information and frequently asked questions, please visit our website : [email or web address]  Please call the Cardiac  Imaging Nurse Navigators with any questions/concerns. [phone] Office    Follow-Up in:  IN 2 WEEKS AS SCHEDULED TO FOLLOW UP ON FLUID STATUS    3-4 months (July), **PLEASE CALL OUR OFFICE IN MAY TO SCHEDULE THIS APPOINTMENT   At the Advanced Heart Failure Clinic, you and your health needs are our priority. We have a designated team specialized in the treatment of Heart Failure. This Care Team includes your primary Heart Failure Specialized Cardiologist (physician), Advanced Practice Providers (APPs- Physician Assistants and Nurse Practitioners), and Pharmacist who all work together to provide you with the care you need, when you need it.   You may see any of the following providers on your designated Care Team at your next follow up:  Dr. Arvilla Meres Dr. Marca Ancona Dr. Dorthula Nettles Dr. Theresia Bough Tonye Becket, NP Robbie Lis, [place] [facility] [place], [place] Brynda Peon, NP [place] Lee, NP Karle Plumber, PharmD   Please be sure to bring in all your medications bottles to every appointment.   Need to Contact us:  If you have any questions or concerns before your next appointment please send [place] a message through [place] or call our office at [phone].    TO LEAVE A MESSAGE FOR THE NURSE SELECT OPTION 2,  PLEASE LEAVE A MESSAGE INCLUDING: YOUR NAME DATE OF BIRTH CALL BACK NUMBER REASON FOR CALL**this is important as we prioritize the call backs  YOU WILL RECEIVE A CALL BACK THE SAME DAY AS LONG AS YOU CALL BEFORE 4:00 PM

## 2023-11-11 ENCOUNTER — Telehealth (HOSPITAL_COMMUNITY): Payer: Self-pay | Admitting: *Deleted

## 2023-11-11 NOTE — Telephone Encounter (Signed)
 Called to confirm/remind patient of their appointment at the Advanced Heart Failure Clinic on 10/31/23***.   Appointment:   [x] Confirmed  [] Left mess   [] No answer/No voice mail  [] Phone not in service  Patient reminded to bring all medications and/or complete list.  Confirmed patient has transportation. Gave directions, instructed to utilize valet parking.

## 2023-11-12 ENCOUNTER — Ambulatory Visit (HOSPITAL_COMMUNITY)
Admission: RE | Admit: 2023-11-12 | Discharge: 2023-11-12 | Disposition: A | Discharge status: Home / Self Care | Source: Ambulatory Visit | Attending: Cardiology | Admitting: Cardiology

## 2023-11-12 ENCOUNTER — Encounter (HOSPITAL_COMMUNITY): Payer: Self-pay

## 2023-11-12 VITALS — BP 90/64 | HR 71 | Wt 316.2 lb

## 2023-11-12 DIAGNOSIS — I5023 Acute on chronic systolic (congestive) heart failure: Secondary | ICD-10-CM | POA: Diagnosis not present

## 2023-11-12 DIAGNOSIS — Z79899 Other long term (current) drug therapy: Secondary | ICD-10-CM | POA: Diagnosis not present

## 2023-11-12 DIAGNOSIS — I428 Other cardiomyopathies: Secondary | ICD-10-CM | POA: Diagnosis not present

## 2023-11-12 DIAGNOSIS — I493 Ventricular premature depolarization: Secondary | ICD-10-CM | POA: Insufficient documentation

## 2023-11-12 DIAGNOSIS — Z7901 Long term (current) use of anticoagulants: Secondary | ICD-10-CM | POA: Diagnosis not present

## 2023-11-12 DIAGNOSIS — Z86711 Personal history of pulmonary embolism: Secondary | ICD-10-CM | POA: Diagnosis not present

## 2023-11-12 DIAGNOSIS — I110 Hypertensive heart disease with heart failure: Secondary | ICD-10-CM | POA: Diagnosis not present

## 2023-11-12 DIAGNOSIS — I4819 Other persistent atrial fibrillation: Secondary | ICD-10-CM | POA: Diagnosis not present

## 2023-11-12 DIAGNOSIS — Z7984 Long term (current) use of oral hypoglycemic drugs: Secondary | ICD-10-CM | POA: Insufficient documentation

## 2023-11-12 DIAGNOSIS — I5022 Chronic systolic (congestive) heart failure: Secondary | ICD-10-CM | POA: Diagnosis not present

## 2023-11-12 DIAGNOSIS — E669 Obesity, unspecified: Secondary | ICD-10-CM | POA: Insufficient documentation

## 2023-11-12 LAB — BASIC METABOLIC PANEL WITH GFR
Anion gap: 10 (ref 5–15)
BUN: 30 mg/dL — ABNORMAL HIGH (ref 6–20)
CO2: 30 mmol/L (ref 22–32)
Calcium: 9.2 mg/dL (ref 8.9–10.3)
Chloride: 95 mmol/L — ABNORMAL LOW (ref 98–111)
Creatinine, Ser: 1.6 mg/dL — ABNORMAL HIGH (ref 0.61–1.24)
GFR, Estimated: 53 mL/min — ABNORMAL LOW (ref >60)
Glucose, Bld: 113 mg/dL — ABNORMAL HIGH (ref 70–99)
Potassium: 4.5 mmol/L (ref 3.5–5.1)
Sodium: 135 mmol/L (ref 135–145)

## 2023-11-12 LAB — BRAIN NATRIURETIC PEPTIDE: B Natriuretic Peptide: 475.5 pg/mL — ABNORMAL HIGH (ref 0.0–100.0)

## 2023-11-12 LAB — DIGOXIN LEVEL: Digoxin Level: 0.5 ng/mL — ABNORMAL LOW (ref 0.8–2.0)

## 2023-11-12 MED ORDER — TORSEMIDE 20 MG PO TABS: ORAL_TABLET | ORAL | 2 refills | Status: AC

## 2023-11-12 MED ORDER — SPIRONOLACTONE 25 MG PO TABS: 25.0000 mg | ORAL_TABLET | Freq: Every day | ORAL | 5 refills | Status: DC

## 2023-11-12 MED ORDER — CARVEDILOL 6.25 MG PO TABS: 6.2500 mg | ORAL_TABLET | Freq: Two times a day (BID) | ORAL | 3 refills | Status: DC

## 2023-11-12 NOTE — Progress Notes (Signed)
 ReDS Vest / Clip - 11/12/23 1400       ReDS Vest / Clip   Station Marker D    Ruler Value 36.5    ReDS Value Range High volume overload    ReDS Actual Value 41

## 2023-11-12 NOTE — Progress Notes (Signed)
 Advanced Heart Failure Clinic Note   PCP: [facility] in [facility] HF Cardiologist: Dr Gala Romney  Chief complaint: f/u for Chronic Systolic Heart failure  HPI: Richard [name] is a 48 y.o. obese male with bilateral PEs and systolic HF diagnosed in 2017 with EF 10-15%  Echo 05/2016: EF 10-15%. RV dilated LV thrombus    Echo 11/20/16 EF 25-30% Probable small laminated LV thrombus.    Admitted 06/24/21 w/ a/c systolic CHF, w/ marked fluid overload  Echo EF < 20%, RV moderately reduced, no LV thrombus. R/LHC-> severe NICM, EF 15%, normal cors, mildly elevated filling pressures w/ preserved CO. D/c wt 278 lb.   Echo 09/20/21: LVEF 20% RV moderately reduced  No LV thrombus   Follow up 05/07/23, volume overloaded ReDs 43%. Cleda Daub restarted, continued on torsemide 40 daily.   Echo 11/24 EF < 20% RV ok -> ICD and advanced therapies d/w HF NP. Not interested. Also not interested in sleep study.   Recent visit on 3/31 he was noted to be in Afib w/ CVR of unknown duration. Admitted to several missed doses of Eliquis in the last month. Also noted to be volume overloaded. ReDs 46%. He had ran out of torsemide for several days but had restarted. He was given a dose of metolazone and Coreg was increased to 9.375 mg bid and instructed to f/u in 2 wks to reassess volume status and to plan increase in torsemide dose if still fluid overloaded. He has also been scheduled for outpatient TEE/DCCV, planned for 4/25.   He returns today for f/u. EKG shows persistent atrial fibrillation 82 bpm. ReDs remains elevated at 48%. Wt up 3 lb from previous visit. He reports good med compliance and good UOP w/ torsemide. Denies resting dyspnea. NYHA Class II-early III. BP soft 90/64. Notes occasional positional dizziness. Drinking lots of fluid >2L/day.   Wt Readings from Last 3 Encounters:  10/27/23 (!) 142.2 kg (313 lb 6.4 oz)  09/01/23 (!) 142.9 kg (315 lb)  06/04/23 (!) 138.2 kg (304 lb 9.6 oz)    ReDs 48%,  abnormal (elevated)   FH: Negative for heart disease SH: No alcohol or drug use.   Review of systems complete and found to be negative unless listed in HPI.    SH:  Social History   Socioeconomic History   Marital status: Single    Spouse name: Not on file   Number of children: Not on file   Years of education: Not on file   Highest education level: Not on file  Occupational History   Not on file  Tobacco Use   Smoking status: Never   Smokeless tobacco: Never  Substance and Sexual Activity   Alcohol use: Yes    Comment: rare   Drug use: No   Sexual activity: Not on file  Other Topics Concern   Not on file  Social History Narrative   Not on file   Social Drivers of Health   Financial Resource Strain: Not on file  Food Insecurity: Low Risk  (10/06/2023)   Received from [facility]   Hunger Vital Sign    Worried About Running Out of Food in the Last Year: Never true    Ran Out of Food in the Last Year: Never true  Transportation Needs: No Transportation Needs (10/06/2023)   Received from [facility]    In the past 12 months, has lack of reliable transportation kept you from medical appointments, meetings, work or from  getting things needed for daily living? : No  Physical Activity: Not on file  Stress: Not on file  Social Connections: Unknown (12/11/2021)   Received from [facility]   Social Network    Social Network: Not on file  Intimate Partner Violence: Unknown (11/02/2021)   Received from [facility], [facility]   HITS    Physically Hurt: Not on file    Insult or Talk Down To: Not on file    Threaten Physical Harm: Not on file    Scream or Curse: Not on file   FH:  Family History  Problem Relation Age of Onset   Other Mother        Neg family history of clotting disorder or heart disease   Clotting disorder Neg Hx    Rheumatologic disease Neg Hx    Past Medical History:  Diagnosis Date   CHF (congestive heart  failure) (HCC)    Current Outpatient Medications  Medication Sig Dispense Refill   apixaban (ELIQUIS) 5 MG TABS tablet Take 1 tablet (5 mg total) by mouth 2 (two) times daily. 180 tablet 3   carvedilol (COREG) 6.25 MG tablet Take 1.5 tablets (9.375 mg total) by mouth 2 (two) times daily with a meal. (Patient taking differently: Take 6.25 mg by mouth 2 (two) times daily with a meal. Patient takes 1 tablet by mouth 3 times a day.) 90 tablet 3   digoxin (LANOXIN) 0.125 MG tablet Take 1 tablet (0.125 mg total) by mouth daily. 30 tablet 3   empagliflozin (JARDIANCE) 10 MG TABS tablet Take 1 tablet (10 mg total) by mouth daily. 30 tablet 5   mexiletine (MEXITIL) 200 MG capsule Take 1 capsule (200 mg total) by mouth 2 (two) times daily. 60 capsule 3   potassium chloride SA (KLOR-CON M) 20 MEQ tablet Take 2 tablets (40 mEq total) by mouth daily. 180 tablet 2   sacubitril-valsartan (ENTRESTO) 49-51 MG Take 0.5 tablets by mouth 2 (two) times daily.     spironolactone (ALDACTONE) 25 MG tablet Take 12.5 mg by mouth every other day.     torsemide (DEMADEX) 20 MG tablet Take 2 tablets (40 mg total) by mouth 2 (two) times daily. 360 tablet 2   No current facility-administered medications for this encounter.   There were no vitals taken for this visit.  Wt Readings from Last 3 Encounters:  10/27/23 (!) 142.2 kg (313 lb 6.4 oz)  09/01/23 (!) 142.9 kg (315 lb)  06/04/23 (!) 138.2 kg (304 lb 9.6 oz)   PHYSICAL EXAM: General:  Well appearing. No respiratory difficulty HEENT: normal Neck: supple. JVD 10 cm Carotids 2+ bilat; no bruits. No lymphadenopathy or thyromegaly appreciated. Cor: PMI nondisplaced. Irregularly irregular rhythm and rate. No rubs, gallops or murmurs. Lungs: clear Abdomen: soft, nontender, nondistended. No hepatosplenomegaly. No bruits or masses. Good bowel sounds. Extremities: no cyanosis, clubbing, rash, trace b/l LE edema Neuro: alert & oriented x 3, cranial nerves grossly intact.  moves all 4 extremities w/o difficulty. Affect pleasant.   ReDS 48%, abnormal   ECG Afib 82 bpm  Personally reviewed  Wt Readings from Last 3 Encounters:  11/12/23 (!) 143.4 kg (316 lb 3.2 oz)  10/27/23 (!) 142.2 kg (313 lb 6.4 oz)  09/01/23 (!) 142.9 kg (315 lb)     ASSESSMENT & PLAN: 1. Acute on Chronic Systolic Heart Failure/NICM - Echo 11/17: EF 10-15%. With LV thrombus. 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  - Echo 4/18: EF 25-30% laminated  LV thrombus. Cannot exclude non-compaction.  - Echo 8/22: EF < 20%, RV okay, trivial MR - Echo 11/22: EF < 20%, RV moderately reduced  - R/LHC 11/22: severe NICM, EF 15%, normal cors, mildly elevated filling pressures w/ preserved CO and mild pulmonary venous HTN.  - CPX 1/23: Moderate HF impairment, limitations also related to body habitus and deconditioning. - Echo 2/23: EF < 20%, RV moderately reduced - Zio 3/23: 10% PVC burden - Echo 11/24 EF < 20% RV ok  - NYHA II. Volume overloaded. ReDs 48%.  - Reduce Coreg back to 6.25 mg bid to allow BP room to push diuresis  - Increase torsemide to 60 qam/40 qpm  - Increase Spiro to 25 mg daily, at bedtime  - Continue Entresto 49/51 mg bid. BP too soft for titration   - Continue Jardiance 10 mg daily. - Continue digoxin 0.125 mg daily. Check dig level  - Check BMP/BNP today  - Needs to fluid restrict < 2L/day  - QRS narrow, not a candidate for CRT.  - Has refused ICD or w/u for advanced therapies - Proceed with CMRI to further evaluate cardiomyopathy  2. Bilateral PE  - Noted on CTA 11/17.  - Continue Eliquis 5 mg bid. No bleeding issues. - Based on Amplify-EXT data can consider dropping to 2.5 bid but given size, elected not to drop dose.  3. Large LV Thrombus - Noted on Echo on 05/2016. - On Eliquis. Given severe LV dysfunction and previous PEs would continue   4. NSVT/PVCs - Not interested in ICD - 10% PVC burden on Zio 2/23 - Continue mexiletine 200 mg bid - Has refused  sleep study - Can repeat Zio as needed to requantify burden  5. Persistent Atrial fibrillation - new onset (duration unclear), rate controlled in the 80s  - will need TEE & DC-CV (says he missed several doses of Eliquis), scheduled next wk  - will need sleep study but has refused - if has ERAF will need to consider switching mexilitene to amio   Return to clinic next wk prior to cardioversion on 4/23 to reassess volume status    Ruddy Corral, PA-C  2:11 PM

## 2023-11-12 NOTE — Patient Instructions (Addendum)
 Good to see you today!  INCREASE torsemide to 60 mg in the am and 40 mg in the PM  DECREASE Coreg to 6.25 mg Twice daily  INCREASE Spironolactone to 25 mg at bedtime  Labs done today, your results will be available in MyChart, we will contact you for abnormal readings.  Your physician recommends that you schedule a follow-up appointment :as scheduled  If you have any questions or concerns before your next appointment please send us  a message through [place] or call our office at [phone].    TO LEAVE A MESSAGE FOR THE NURSE SELECT OPTION 2, PLEASE LEAVE A MESSAGE INCLUDING: YOUR NAME DATE OF BIRTH CALL BACK NUMBER REASON FOR CALL**this is important as we prioritize the call backs  YOU WILL RECEIVE A CALL BACK THE SAME DAY AS LONG AS YOU CALL BEFORE 4:00 PM At the [facility], you and your health needs are our priority. As part of our continuing mission to provide you with exceptional heart care, we have created designated Provider Care Teams. These Care Teams include your primary Cardiologist (physician) and Advanced Practice Providers (APPs- Physician Assistants and Nurse Practitioners) who all work together to provide you with the care you need, when you need it.   You may see any of the following providers on your designated Care Team at your next follow up: Dr Jules Oar Dr Peder Bourdon Dr. Alwin Baars Dr. Arta Lark Amy Marijane Shoulders, NP Ruddy Corral, [place] [facility] [place], [place] Dennise Fitz, NP [place] Lee, NP Shawnee Dellen, NP Luster Salters, PharmD Bevely Brush, PharmD   Please be sure to bring in all your medications bottles to every appointment.    Thank you for choosing [facility]

## 2023-11-16 ENCOUNTER — Other Ambulatory Visit (HOSPITAL_COMMUNITY): Payer: Self-pay | Admitting: Physician Assistant

## 2023-11-18 NOTE — H&P (View-Only) (Signed)
 Advanced [facility] Failure Clinic Note   PCP: [facility] in [facility] HF Cardiologist: Dr Julane Ny  Chief complaint:Heart Failure HPI: Richard [name] is a 48 y.o. obese male with bilateral PEs and systolic HF diagnosed in 2017 with EF 10-15%  Echo 05/2016: EF 10-15%. RV dilated LV thrombus    Echo 11/20/16 EF 25-30% Probable small laminated LV thrombus.    Admitted 06/24/21 w/ a/c systolic CHF, w/ marked fluid overload  Echo EF < 20%, RV moderately reduced, no LV thrombus. R/LHC-> severe NICM, EF 15%, normal cors, mildly elevated filling pressures w/ preserved CO. D/c wt 278 lb.   Echo 09/20/21: LVEF 20% RV moderately reduced  No LV thrombus   Follow up 05/07/23, volume overloaded ReDs 43%. Spiro restarted, continued on torsemide  40 daily.   Echo 11/24 EF < 20% RV ok -> ICD and advanced therapies d/w HF NP. Not interested. Also not interested in sleep study.   Recent visit on 3/31 he was noted to be in Afib w/ CVR of unknown duration. Admitted to several missed doses of Eliquis  in the last month. Also noted to be volume overloaded. ReDs 46%. He had ran out of torsemide  for several days but had restarted. He was given a dose of metolazone  and Coreg  was increased to 9.375 mg bid and instructed to f/u in 2 wks to reassess volume status and to plan increase in torsemide  dose if still fluid overloaded. He has also been scheduled for outpatiet TEE/DCCV, planned for 4/25.   4/16/Last visit diuretics increased. Reds elevated. Coreg  was cut back. He was set up for TEE/Cardioversion.   Today he returns for HF follow up.Overall feeling fine. A little short breath with steps. Denies PND/Orthopnea. Appetite ok. No fever or chills. Weight at home  pounds. Taking all medications. He is not taking jardiance  because pending cardioversion.   FH: Negative for heart disease SH: No alcohol or drug use.   Review of systems complete and found to be negative unless listed in HPI.    SH:  Social  History   Socioeconomic History   Marital status: Single    Spouse name: Not on file   Number of children: Not on file   Years of education: Not on file   Highest education level: Not on file  Occupational History   Not on file  Tobacco Use   Smoking status: Never   Smokeless tobacco: Never  Substance and Sexual Activity   Alcohol use: Yes    Comment: rare   Drug use: No   Sexual activity: Not on file  Other Topics Concern   Not on file  Social History Narrative   Not on file   Social Drivers of Health   Financial Resource Strain: Not on file  Food Insecurity: Low Risk  (10/06/2023)   Received from [facility]   Hunger Vital Sign    Worried About Running Out of Food in the Last Year: Never true    Ran Out of Food in the Last Year: Never true  Transportation Needs: No Transportation Needs (10/06/2023)   Received from [facility]    In the past 12 months, has lack of reliable transportation kept you from medical appointments, meetings, work or from getting things needed for daily living? : No  Physical Activity: Not on file  Stress: Not on file  Social Connections: Unknown (12/11/2021)   Received from [facility], [facility]   Social Network    Social Network: Not  on file  Intimate Partner Violence: Unknown (11/02/2021)   Received from [facility], [facility]   HITS    Physically Hurt: Not on file    Insult or Talk Down To: Not on file    Threaten Physical Harm: Not on file    Scream or Curse: Not on file   FH:  Family History  Problem Relation Age of Onset   Other Mother        Neg family history of clotting disorder or heart disease   Clotting disorder Neg Hx    Rheumatologic disease Neg Hx    Past Medical History:  Diagnosis Date   CHF (congestive heart failure) (HCC)    Current Outpatient Medications  Medication Sig Dispense Refill   apixaban  (ELIQUIS ) 5 MG TABS tablet Take 1 tablet (5 mg total) by mouth 2 (two) times daily.  180 tablet 3   carvedilol  (COREG ) 6.25 MG tablet Take 1 tablet (6.25 mg total) by mouth 2 (two) times daily with a meal. 90 tablet 3   digoxin  (LANOXIN ) 0.125 MG tablet Take 1 tablet (0.125 mg total) by mouth daily. 30 tablet 3   mexiletine (MEXITIL) 200 MG capsule Take 1 capsule (200 mg total) by mouth 2 (two) times daily. 60 capsule 3   potassium chloride  SA (KLOR-CON  M) 20 MEQ tablet Take 2 tablets (40 mEq total) by mouth daily. (Patient taking differently: Take 40 mEq by mouth 2 (two) times daily.) 180 tablet 2   sacubitril -valsartan  (ENTRESTO ) 49-51 MG Take 0.5 tablets by mouth 2 (two) times daily.     spironolactone  (ALDACTONE ) 25 MG tablet Take 25 mg by mouth daily.     torsemide  (DEMADEX ) 20 MG tablet Take 3 tablets (60 mg total) by mouth in the morning AND 2 tablets (40 mg total) every evening. 360 tablet 2   empagliflozin  (JARDIANCE ) 10 MG TABS tablet Take 1 tablet (10 mg total) by mouth daily. (Patient not taking: Reported on 11/19/2023) 30 tablet 5   No current facility-administered medications for this encounter.   BP 110/82   Ht 6\' 7"  (2.007 m)   Wt (!) 138.9 kg (306 lb 3.2 oz)   SpO2 95%   BMI 34.49 kg/m   Wt Readings from Last 3 Encounters:  11/19/23 (!) 138.9 kg (306 lb 3.2 oz)  11/12/23 (!) 143.4 kg (316 lb 3.2 oz)  10/27/23 (!) 142.2 kg (313 lb 6.4 oz)   PHYSICAL EXAM: General:   No resp difficulty Neck: supple. no JVD.  Cor: PMI nondisplaced. Irregular rate & rhythm. No rubs, gallops or murmurs. Lungs: clear Abdomen: soft, nontender, nondistended.  Extremities: no cyanosis, clubbing, rash, edema Neuro: alert & oriented x3   Wt Readings from Last 3 Encounters:  11/19/23 (!) 138.9 kg (306 lb 3.2 oz)  11/12/23 (!) 143.4 kg (316 lb 3.2 oz)  10/27/23 (!) 142.2 kg (313 lb 6.4 oz)     ASSESSMENT & PLAN: 1.  Chronic Systolic Heart Failure/NICM - Echo 11/17: EF 10-15%. With LV thrombus. 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  - Echo 4/18: EF 25-30% laminated LV  thrombus. Cannot exclude non-compaction.  - Echo 8/22: EF < 20%, RV okay, trivial MR - Echo 11/22: EF < 20%, RV moderately reduced  - R/LHC 11/22: severe NICM, EF 15%, normal cors, mildly elevated filling pressures w/ preserved CO and mild pulmonary venous HTN.  - CPX 1/23: Moderate HF impairment, limitations also related to body habitus and deconditioning. - Echo 2/23: EF < 20%, RV moderately reduced -  Zio 3/23: 10% PVC burden - Echo 11/24 EF < 20% RV ok  - NYHA II.  ReDs reading: 38 %, mildly elevated. On exam he looks pretty good. Continue current diuretic regimen.  - Continue Coreg  back to 6.25 mg bid  - Continue torsemide  to 60 qam/40 qpm  - Continue Spiro to 25 mg daily, at bedtime  - Continue Entresto  49/51 mg bid. BP too soft for titration   - Restart jardiance  cardioversion on 11/21/23.  - Continue digoxin  0.125 mg daily. Recent dig level stable.   - QRS narrow, not a candidate for CRT.  - Has refused ICD or w/u for advanced therapies - He has CMRI 12/17/23 .   2. Bilateral PE  - Noted on CTA 11/17.  - Continue Eliquis  5 mg bid. No bleeding issues. - Based on Amplify-EXT data can consider dropping to 2.5 bid but given size, elected not to drop dose.  3. Large LV Thrombus - Noted on Echo on 05/2016.No Thrombus on ECHO 2024.  - On Eliquis . Given severe LV dysfunction and previous PEs would continue   4. NSVT/PVCs - Not interested in ICD - 10% PVC burden on Zio 2/23 - Continue mexiletine 200 mg bid - Has refused sleep study - Can repeat Zio as needed to requantify burden  5. Persistent Atrial fibrillation In SR 05/2023 . EKG 10/27/23 A fib. EKG 11/12/23 A fib.   Last week he was seen and missed some doses of Eliquis  but since that time he has been compliant.  - He has TEE & DC-CV 11/21/23.  We discussed Informed Consent   Shared Decision Making/Informed Consent   The risks [stroke, cardiac arrhythmias rarely resulting in the need for a temporary or permanent pacemaker, skin  irritation or burns, esophageal damage, perforation (1:10,000 risk), bleeding, pharyngeal hematoma as well as other potential complications associated with conscious sedation including aspiration, arrhythmia, respiratory failure and death], benefits (treatment guidance, restoration of normal sinus rhythm, diagnostic support) and alternatives of a transesophageal echocardiogram guided cardioversion were discussed in detail with Mr. Emel and he is willing to proceed.    - Refused sleep study but has refused - if has ERAF will need to consider switching mexilitene to amio - Refer to EP for possible ablation.    Check BMET and BNP  Follow up in 4-6 weeks.    Nieves Bars, NP  9:28 AM

## 2023-11-18 NOTE — Progress Notes (Signed)
 Advanced [facility] Failure Clinic Note   PCP: [facility] in [facility] HF Cardiologist: Dr Julane Ny  Chief complaint:Heart Failure HPI: Richard [name] is a 48 y.o. obese male with bilateral PEs and systolic HF diagnosed in 2017 with EF 10-15%  Echo 05/2016: EF 10-15%. RV dilated LV thrombus    Echo 11/20/16 EF 25-30% Probable small laminated LV thrombus.    Admitted 06/24/21 w/ a/c systolic CHF, w/ marked fluid overload  Echo EF < 20%, RV moderately reduced, no LV thrombus. R/LHC-> severe NICM, EF 15%, normal cors, mildly elevated filling pressures w/ preserved CO. D/c wt 278 lb.   Echo 09/20/21: LVEF 20% RV moderately reduced  No LV thrombus   Follow up 05/07/23, volume overloaded ReDs 43%. Spiro restarted, continued on torsemide  40 daily.   Echo 11/24 EF < 20% RV ok -> ICD and advanced therapies d/w HF NP. Not interested. Also not interested in sleep study.   Recent visit on 3/31 he was noted to be in Afib w/ CVR of unknown duration. Admitted to several missed doses of Eliquis  in the last month. Also noted to be volume overloaded. ReDs 46%. He had ran out of torsemide  for several days but had restarted. He was given a dose of metolazone  and Coreg  was increased to 9.375 mg bid and instructed to f/u in 2 wks to reassess volume status and to plan increase in torsemide  dose if still fluid overloaded. He has also been scheduled for outpatiet TEE/DCCV, planned for 4/25.   4/16/Last visit diuretics increased. Reds elevated. Coreg  was cut back. He was set up for TEE/Cardioversion.   Today he returns for HF follow up.Overall feeling fine. A little short breath with steps. Denies PND/Orthopnea. Appetite ok. No fever or chills. Weight at home  pounds. Taking all medications. He is not taking jardiance  because pending cardioversion.   FH: Negative for heart disease SH: No alcohol or drug use.   Review of systems complete and found to be negative unless listed in HPI.    SH:  Social  History   Socioeconomic History   Marital status: Single    Spouse name: Not on file   Number of children: Not on file   Years of education: Not on file   Highest education level: Not on file  Occupational History   Not on file  Tobacco Use   Smoking status: Never   Smokeless tobacco: Never  Substance and Sexual Activity   Alcohol use: Yes    Comment: rare   Drug use: No   Sexual activity: Not on file  Other Topics Concern   Not on file  Social History Narrative   Not on file   Social Drivers of Health   Financial Resource Strain: Not on file  Food Insecurity: Low Risk  (10/06/2023)   Received from [facility]   Hunger Vital Sign    Worried About Running Out of Food in the Last Year: Never true    Ran Out of Food in the Last Year: Never true  Transportation Needs: No Transportation Needs (10/06/2023)   Received from [facility]    In the past 12 months, has lack of reliable transportation kept you from medical appointments, meetings, work or from getting things needed for daily living? : No  Physical Activity: Not on file  Stress: Not on file  Social Connections: Unknown (12/11/2021)   Received from [facility], [facility]   Social Network    Social Network: Not  on file  Intimate Partner Violence: Unknown (11/02/2021)   Received from [facility], [facility]   HITS    Physically Hurt: Not on file    Insult or Talk Down To: Not on file    Threaten Physical Harm: Not on file    Scream or Curse: Not on file   FH:  Family History  Problem Relation Age of Onset   Other Mother        Neg family history of clotting disorder or heart disease   Clotting disorder Neg Hx    Rheumatologic disease Neg Hx    Past Medical History:  Diagnosis Date   CHF (congestive heart failure) (HCC)    Current Outpatient Medications  Medication Sig Dispense Refill   apixaban  (ELIQUIS ) 5 MG TABS tablet Take 1 tablet (5 mg total) by mouth 2 (two) times daily.  180 tablet 3   carvedilol  (COREG ) 6.25 MG tablet Take 1 tablet (6.25 mg total) by mouth 2 (two) times daily with a meal. 90 tablet 3   digoxin  (LANOXIN ) 0.125 MG tablet Take 1 tablet (0.125 mg total) by mouth daily. 30 tablet 3   mexiletine (MEXITIL) 200 MG capsule Take 1 capsule (200 mg total) by mouth 2 (two) times daily. 60 capsule 3   potassium chloride  SA (KLOR-CON  M) 20 MEQ tablet Take 2 tablets (40 mEq total) by mouth daily. (Patient taking differently: Take 40 mEq by mouth 2 (two) times daily.) 180 tablet 2   sacubitril -valsartan  (ENTRESTO ) 49-51 MG Take 0.5 tablets by mouth 2 (two) times daily.     spironolactone  (ALDACTONE ) 25 MG tablet Take 25 mg by mouth daily.     torsemide  (DEMADEX ) 20 MG tablet Take 3 tablets (60 mg total) by mouth in the morning AND 2 tablets (40 mg total) every evening. 360 tablet 2   empagliflozin  (JARDIANCE ) 10 MG TABS tablet Take 1 tablet (10 mg total) by mouth daily. (Patient not taking: Reported on 11/19/2023) 30 tablet 5   No current facility-administered medications for this encounter.   BP 110/82   Ht 6\' 7"  (2.007 m)   Wt (!) 138.9 kg (306 lb 3.2 oz)   SpO2 95%   BMI 34.49 kg/m   Wt Readings from Last 3 Encounters:  11/19/23 (!) 138.9 kg (306 lb 3.2 oz)  11/12/23 (!) 143.4 kg (316 lb 3.2 oz)  10/27/23 (!) 142.2 kg (313 lb 6.4 oz)   PHYSICAL EXAM: General:   No resp difficulty Neck: supple. no JVD.  Cor: PMI nondisplaced. Irregular rate & rhythm. No rubs, gallops or murmurs. Lungs: clear Abdomen: soft, nontender, nondistended.  Extremities: no cyanosis, clubbing, rash, edema Neuro: alert & oriented x3   Wt Readings from Last 3 Encounters:  11/19/23 (!) 138.9 kg (306 lb 3.2 oz)  11/12/23 (!) 143.4 kg (316 lb 3.2 oz)  10/27/23 (!) 142.2 kg (313 lb 6.4 oz)     ASSESSMENT & PLAN: 1.  Chronic Systolic Heart Failure/NICM - Echo 11/17: EF 10-15%. With LV thrombus. 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  - Echo 4/18: EF 25-30% laminated LV  thrombus. Cannot exclude non-compaction.  - Echo 8/22: EF < 20%, RV okay, trivial MR - Echo 11/22: EF < 20%, RV moderately reduced  - R/LHC 11/22: severe NICM, EF 15%, normal cors, mildly elevated filling pressures w/ preserved CO and mild pulmonary venous HTN.  - CPX 1/23: Moderate HF impairment, limitations also related to body habitus and deconditioning. - Echo 2/23: EF < 20%, RV moderately reduced -  Zio 3/23: 10% PVC burden - Echo 11/24 EF < 20% RV ok  - NYHA II.  ReDs reading: 38 %, mildly elevated. On exam he looks pretty good. Continue current diuretic regimen.  - Continue Coreg  back to 6.25 mg bid  - Continue torsemide  to 60 qam/40 qpm  - Continue Spiro to 25 mg daily, at bedtime  - Continue Entresto  49/51 mg bid. BP too soft for titration   - Restart jardiance  cardioversion on 11/21/23.  - Continue digoxin  0.125 mg daily. Recent dig level stable.   - QRS narrow, not a candidate for CRT.  - Has refused ICD or w/u for advanced therapies - He has CMRI 12/17/23 .   2. Bilateral PE  - Noted on CTA 11/17.  - Continue Eliquis  5 mg bid. No bleeding issues. - Based on Amplify-EXT data can consider dropping to 2.5 bid but given size, elected not to drop dose.  3. Large LV Thrombus - Noted on Echo on 05/2016.No Thrombus on ECHO 2024.  - On Eliquis . Given severe LV dysfunction and previous PEs would continue   4. NSVT/PVCs - Not interested in ICD - 10% PVC burden on Zio 2/23 - Continue mexiletine 200 mg bid - Has refused sleep study - Can repeat Zio as needed to requantify burden  5. Persistent Atrial fibrillation In SR 05/2023 . EKG 10/27/23 A fib. EKG 11/12/23 A fib.   Last week he was seen and missed some doses of Eliquis  but since that time he has been compliant.  - He has TEE & DC-CV 11/21/23.  We discussed Informed Consent   Shared Decision Making/Informed Consent   The risks [stroke, cardiac arrhythmias rarely resulting in the need for a temporary or permanent pacemaker, skin  irritation or burns, esophageal damage, perforation (1:10,000 risk), bleeding, pharyngeal hematoma as well as other potential complications associated with conscious sedation including aspiration, arrhythmia, respiratory failure and death], benefits (treatment guidance, restoration of normal sinus rhythm, diagnostic support) and alternatives of a transesophageal echocardiogram guided cardioversion were discussed in detail with Mr. Emel and he is willing to proceed.    - Refused sleep study but has refused - if has ERAF will need to consider switching mexilitene to amio - Refer to EP for possible ablation.    Check BMET and BNP  Follow up in 4-6 weeks.    Nieves Bars, NP  9:28 AM

## 2023-11-19 ENCOUNTER — Ambulatory Visit (HOSPITAL_COMMUNITY)
Admission: RE | Admit: 2023-11-19 | Discharge: 2023-11-19 | Disposition: A | Discharge status: Home / Self Care | Source: Ambulatory Visit | Attending: Adult Health | Admitting: Adult Health

## 2023-11-19 ENCOUNTER — Encounter (HOSPITAL_COMMUNITY): Payer: Self-pay

## 2023-11-19 ENCOUNTER — Encounter (HOSPITAL_COMMUNITY): Payer: Self-pay | Admitting: Adult Health

## 2023-11-19 VITALS — BP 110/82 | Ht 79.0 in | Wt 306.2 lb

## 2023-11-19 DIAGNOSIS — Z86718 Personal history of other venous thrombosis and embolism: Secondary | ICD-10-CM | POA: Diagnosis not present

## 2023-11-19 DIAGNOSIS — I480 Paroxysmal atrial fibrillation: Secondary | ICD-10-CM | POA: Diagnosis not present

## 2023-11-19 DIAGNOSIS — Z86711 Personal history of pulmonary embolism: Secondary | ICD-10-CM | POA: Insufficient documentation

## 2023-11-19 DIAGNOSIS — I513 Intracardiac thrombosis, not elsewhere classified: Secondary | ICD-10-CM | POA: Diagnosis not present

## 2023-11-19 DIAGNOSIS — E669 Obesity, unspecified: Secondary | ICD-10-CM | POA: Insufficient documentation

## 2023-11-19 DIAGNOSIS — I2699 Other pulmonary embolism without acute cor pulmonale: Secondary | ICD-10-CM

## 2023-11-19 DIAGNOSIS — Z79899 Other long term (current) drug therapy: Secondary | ICD-10-CM | POA: Insufficient documentation

## 2023-11-19 DIAGNOSIS — Z6834 Body mass index (BMI) 34.0-34.9, adult: Secondary | ICD-10-CM | POA: Insufficient documentation

## 2023-11-19 DIAGNOSIS — I5022 Chronic systolic (congestive) heart failure: Secondary | ICD-10-CM | POA: Insufficient documentation

## 2023-11-19 DIAGNOSIS — I493 Ventricular premature depolarization: Secondary | ICD-10-CM | POA: Diagnosis not present

## 2023-11-19 DIAGNOSIS — I4819 Other persistent atrial fibrillation: Secondary | ICD-10-CM | POA: Diagnosis not present

## 2023-11-19 DIAGNOSIS — I472 Ventricular tachycardia, unspecified: Secondary | ICD-10-CM | POA: Insufficient documentation

## 2023-11-19 DIAGNOSIS — Z7901 Long term (current) use of anticoagulants: Secondary | ICD-10-CM | POA: Insufficient documentation

## 2023-11-19 DIAGNOSIS — I428 Other cardiomyopathies: Secondary | ICD-10-CM | POA: Insufficient documentation

## 2023-11-19 LAB — BRAIN NATRIURETIC PEPTIDE: B Natriuretic Peptide: 283.9 pg/mL — ABNORMAL HIGH (ref 0.0–100.0)

## 2023-11-19 LAB — BASIC METABOLIC PANEL WITH GFR
Anion gap: 9 (ref 5–15)
BUN: 26 mg/dL — ABNORMAL HIGH (ref 6–20)
CO2: 30 mmol/L (ref 22–32)
Calcium: 9.5 mg/dL (ref 8.9–10.3)
Chloride: 98 mmol/L (ref 98–111)
Creatinine, Ser: 1.71 mg/dL — ABNORMAL HIGH (ref 0.61–1.24)
GFR, Estimated: 49 mL/min — ABNORMAL LOW (ref >60)
Glucose, Bld: 150 mg/dL — ABNORMAL HIGH (ref 70–99)
Potassium: 4.5 mmol/L (ref 3.5–5.1)
Sodium: 137 mmol/L (ref 135–145)

## 2023-11-19 NOTE — Progress Notes (Signed)
 ReDS Vest / Clip - 11/19/23 0915       ReDS Vest / Clip   Station Marker D    Ruler Value 39    ReDS Value Range Low volume    ReDS Actual Value 38

## 2023-11-19 NOTE — Patient Instructions (Addendum)
 Medication Changes:  MAKE SURE TO TAKE CARVEDILOL  AND ELIQUIS  THE MORNING OF PROCEDURE WITH SIPS OF WATER--- MAY HOLD (NOT TAKE) THE OTHERS UNTIL AFTER PROCEDURE   DO NOT MISS ANY DOSES OF ELIQUIS   Lab Work:  Labs done today, your results will be available in MyChart, we will contact you for abnormal readings.  Follow-Up in: 4-6 WEEKS AS SCHEDULED   At the [facility], you and your health needs are our priority. We have a designated team specialized in the treatment of Heart Failure. This Care Team includes your primary Heart Failure Specialized Cardiologist (physician), Advanced Practice Providers (APPs- Physician Assistants and Nurse Practitioners), and Pharmacist who all work together to provide you with the care you need, when you need it.   You may see any of the following providers on your designated Care Team at your next follow up:  Dr. Jules Oar Dr. Peder Bourdon Dr. Alwin Baars Dr. Judyth Nunnery Nieves Bars, NP Ruddy Corral, [place] [facility], [place] Dennise Fitz, NP [place] Lee, NP Luster Salters, PharmD   Please be sure to bring in all your medications bottles to every appointment.   Need to Contact Us :  If you have any questions or concerns before your next appointment please send us  a message through [place] or call our office at [phone].    TO LEAVE A MESSAGE FOR THE NURSE SELECT OPTION 2, PLEASE LEAVE A MESSAGE INCLUDING: YOUR NAME DATE OF BIRTH CALL BACK NUMBER REASON FOR CALL**this is important as we prioritize the call backs  YOU WILL RECEIVE A CALL BACK THE SAME DAY AS LONG AS YOU CALL BEFORE 4:00 PM

## 2023-11-20 NOTE — Progress Notes (Signed)
 Spoke to patient and instructed them to come at 0730  and to be NPO after 0000.  Medications reviewed.    Confirmed that patient will have a ride home and someone to stay with them for 24 hours after the procedure.

## 2023-11-21 ENCOUNTER — Encounter (HOSPITAL_COMMUNITY)
Admission: RE | Disposition: A | Discharge status: Home / Self Care | Payer: Self-pay | Source: Home / Self Care | Attending: Internal Medicine

## 2023-11-21 ENCOUNTER — Ambulatory Visit (HOSPITAL_COMMUNITY)
Admission: RE | Admit: 2023-11-21 | Discharge: 2023-11-21 | Disposition: A | Discharge status: Home / Self Care | Attending: Internal Medicine | Admitting: Internal Medicine

## 2023-11-21 ENCOUNTER — Ambulatory Visit (HOSPITAL_COMMUNITY)

## 2023-11-21 ENCOUNTER — Other Ambulatory Visit: Payer: Self-pay

## 2023-11-21 ENCOUNTER — Ambulatory Visit (HOSPITAL_COMMUNITY): Payer: Self-pay | Admitting: Registered Nurse

## 2023-11-21 DIAGNOSIS — I4891 Unspecified atrial fibrillation: Secondary | ICD-10-CM

## 2023-11-21 DIAGNOSIS — I472 Ventricular tachycardia, unspecified: Secondary | ICD-10-CM | POA: Insufficient documentation

## 2023-11-21 DIAGNOSIS — I340 Nonrheumatic mitral (valve) insufficiency: Secondary | ICD-10-CM | POA: Insufficient documentation

## 2023-11-21 DIAGNOSIS — I3139 Other pericardial effusion (noninflammatory): Secondary | ICD-10-CM | POA: Insufficient documentation

## 2023-11-21 DIAGNOSIS — Z7901 Long term (current) use of anticoagulants: Secondary | ICD-10-CM | POA: Insufficient documentation

## 2023-11-21 DIAGNOSIS — Z79899 Other long term (current) drug therapy: Secondary | ICD-10-CM | POA: Insufficient documentation

## 2023-11-21 DIAGNOSIS — I5022 Chronic systolic (congestive) heart failure: Secondary | ICD-10-CM | POA: Diagnosis not present

## 2023-11-21 DIAGNOSIS — I513 Intracardiac thrombosis, not elsewhere classified: Secondary | ICD-10-CM | POA: Diagnosis not present

## 2023-11-21 DIAGNOSIS — I480 Paroxysmal atrial fibrillation: Secondary | ICD-10-CM | POA: Diagnosis not present

## 2023-11-21 DIAGNOSIS — I2699 Other pulmonary embolism without acute cor pulmonale: Secondary | ICD-10-CM | POA: Diagnosis not present

## 2023-11-21 HISTORY — PX: TRANSESOPHAGEAL ECHOCARDIOGRAM (CATH LAB): EP1270

## 2023-11-21 HISTORY — PX: CARDIOVERSION: EP1203

## 2023-11-21 LAB — ECHO TEE: Est EF: <20

## 2023-11-21 MED ORDER — PROPOFOL 500 MG/50ML IV EMUL
INTRAVENOUS | Status: DC | PRN
  Administered 2023-11-21: 100 ug/kg/min via INTRAVENOUS

## 2023-11-21 MED ORDER — SODIUM CHLORIDE 0.9% FLUSH
3.0000 mL | Freq: Two times a day (BID) | INTRAVENOUS | Status: DC
Start: 2023-11-21 — End: 2023-11-21

## 2023-11-21 MED ORDER — SODIUM CHLORIDE 0.9 % IV SOLN
INTRAVENOUS | Status: DC | PRN
Start: 2023-11-21 — End: 2023-11-21

## 2023-11-21 MED ORDER — SODIUM CHLORIDE 0.9% FLUSH: 3.0000 mL | INTRAVENOUS | Status: DC | PRN

## 2023-11-21 MED ORDER — ETOMIDATE 2 MG/ML IV SOLN
INTRAVENOUS | Status: DC | PRN
  Administered 2023-11-21: 30 mg via INTRAVENOUS

## 2023-11-21 MED ORDER — PROPOFOL 10 MG/ML IV BOLUS
INTRAVENOUS | Status: DC | PRN
Start: 2023-11-21 — End: 2023-11-21
  Administered 2023-11-21: 10 mg via INTRAVENOUS

## 2023-11-21 MED ORDER — PHENYLEPHRINE HCL-NACL 20-0.9 MG/250ML-% IV SOLN
INTRAVENOUS | Status: DC | PRN
  Administered 2023-11-21: 50 ug/min via INTRAVENOUS

## 2023-11-21 SURGICAL SUPPLY — 1 items: PAD DEFIB RADIO PHYSIO CONN (PAD) ×1 IMPLANT

## 2023-11-21 NOTE — Transfer of Care (Signed)
 Immediate Anesthesia Transfer of Care Note  Patient: Richard [name]  Procedure(s) Performed: TRANSESOPHAGEAL ECHOCARDIOGRAM CARDIOVERSION  Patient Location: Cath Lab  Anesthesia Type:MAC  Level of Consciousness: awake  Airway & Oxygen Therapy: Patient Spontanous Breathing  Post-op Assessment: Report given to RN and Post -op Vital signs reviewed and stable  Post vital signs: Reviewed and stable  Last Vitals:  Vitals Value Taken Time  BP 111/83 (92)   Temp    Pulse 80 11/21/23 0846  Resp 22 11/21/23 0846  SpO2 98 % 11/21/23 0846  Vitals shown include unfiled device data.  Last Pain:  Vitals:   11/21/23 0728  TempSrc: Temporal         Complications: No notable events documented.

## 2023-11-21 NOTE — Anesthesia Preprocedure Evaluation (Addendum)
 Anesthesia Evaluation  Patient identified by MRN, date of birth, ID band Patient awake    Reviewed: Allergy & Precautions, NPO status , Patient's Chart, lab work & pertinent test results  Airway Mallampati: III  TM Distance: >3 FB Neck ROM: Full    Dental no notable dental hx. (+) Teeth Intact, Dental Advisory Given   Pulmonary PE   Pulmonary exam normal breath sounds clear to auscultation       Cardiovascular +CHF  Normal cardiovascular exam+ dysrhythmias (eliquis  LD 11/21/23) Atrial Fibrillation  Rhythm:Irregular Rate:Normal  TTE 2024 1. Left ventricular ejection fraction, by estimation, is <20%. The left  ventricle has severely decreased function. The left ventricle demonstrates  global hypokinesis. The left ventricular internal cavity size was severely  dilated. Left ventricular  diastolic parameters are consistent with Grade I diastolic dysfunction  (impaired relaxation).   2. Right ventricular systolic function is normal. The right ventricular  size is normal.   3. Left atrial size was moderately dilated.   4. The mitral valve is normal in structure. Mild mitral valve  regurgitation. No evidence of mitral stenosis.   5. The aortic valve is tricuspid. Aortic valve regurgitation is not  visualized. No aortic stenosis is present.   6. The inferior vena cava is normal in size with greater than 50%  respiratory variability, suggesting right atrial pressure of 3 mmHg.     Neuro/Psych negative neurological ROS  negative psych ROS   GI/Hepatic negative GI ROS, Neg liver ROS,,,  Endo/Other  negative endocrine ROS    Renal/GU Renal InsufficiencyRenal disease  negative genitourinary   Musculoskeletal negative musculoskeletal ROS (+)    Abdominal   Peds  Hematology negative hematology ROS (+)   Anesthesia Other Findings   Reproductive/Obstetrics                             Anesthesia  Physical Anesthesia Plan  ASA: 4  Anesthesia Plan: MAC   Post-op Pain Management:    Induction: Intravenous  PONV Risk Score and Plan: Propofol  infusion and Treatment may vary due to age or medical condition  Airway Management Planned: Natural Airway  Additional Equipment:   Intra-op Plan:   Post-operative Plan:   Informed Consent: I have reviewed the patients History and Physical, chart, labs and discussed the procedure including the risks, benefits and alternatives for the proposed anesthesia with the patient or authorized representative who has indicated his/her understanding and acceptance.     Dental advisory given  Plan Discussed with: CRNA  Anesthesia Plan Comments:        Anesthesia Quick Evaluation

## 2023-11-21 NOTE — CV Procedure (Signed)
   TRANSESOPHAGEAL ECHOCARDIOGRAM GUIDED DIRECT CURRENT CARDIOVERSION  NAME:  Richard [name]   MRN: [id number] DOB:  Dec 15, 1975   ADMIT DATE: 11/21/2023  INDICATIONS:  Atrial fib  PROCEDURE:   Informed consent was obtained prior to the procedure. The risks, benefits and alternatives for the procedure were discussed and the patient comprehended these risks.  Risks include, but are not limited to, cough, sore throat, vomiting, nausea, somnolence, esophageal and stomach trauma or perforation, bleeding, low blood pressure, aspiration, pneumonia, infection, trauma to the teeth and death.    After a procedural time-out, the oropharynx was anesthetized and the patient was sedated by the anesthesia service. The transesophageal probe was inserted in the esophagus and stomach without difficulty and multiple views were obtained.   FINDINGS:  LEFT VENTRICLE: EF = < 20% global HK. Severely dilated. Laminated LV apical clot  RIGHT VENTRICLE: Severely HK  LEFT ATRIUM: Severely dilated  LEFT ATRIAL APPENDAGE:  RIGHT ATRIUM: Moderately dilated  AORTIC VALVE:  Trileaflet. No AI/AS  MITRAL VALVE:    Mild to moderate MR  TRICUSPID VALVE: Trivial TR  PULMONIC VALVE: Trivial PI  INTERATRIAL SEPTUM: No PFO/ASD  PERICARDIUM: small anterior effusion  DESCENDING AORTA: mild plaque   CARDIOVERSION:     Indications:  Atrial Fibrillation  Procedure Details:  Once the TEE was complete, the patient had the defibrillator pads placed in the anterior and posterior position. Once an appropriate level of sedation was achieved, the patient received a single biphasic, synchronized 200J shock with prompt conversion to sinus rhythm. No apparent complications.   Jules Oar, MD  8:37 AM

## 2023-11-21 NOTE — Interval H&P Note (Signed)
 History and Physical Interval Note:  11/21/2023 8:07 AM  Richard [name]  has presented today for surgery, with the diagnosis of AFIB.  The various methods of treatment have been discussed with the patient and family. After consideration of risks, benefits and other options for treatment, the patient has consented to  Procedure(s): TRANSESOPHAGEAL ECHOCARDIOGRAM (N/A) CARDIOVERSION (N/A) as a surgical intervention.  The patient's history has been reviewed, patient examined, no change in status, stable for surgery.  I have reviewed the patient's chart and labs.  Questions were answered to the patient's satisfaction.     Reg Bircher

## 2023-11-22 ENCOUNTER — Encounter (HOSPITAL_COMMUNITY): Payer: Self-pay | Admitting: Internal Medicine

## 2023-11-24 NOTE — Anesthesia Postprocedure Evaluation (Signed)
 Anesthesia Post Note  Patient: Richard [name]  Procedure(s) Performed: TRANSESOPHAGEAL ECHOCARDIOGRAM CARDIOVERSION     Patient location during evaluation: Cath Lab Anesthesia Type: MAC Level of consciousness: awake Pain management: pain level controlled Vital Signs Assessment: post-procedure vital signs reviewed and stable Respiratory status: spontaneous breathing, nonlabored ventilation and respiratory function stable Cardiovascular status: blood pressure returned to baseline and stable Postop Assessment: no apparent nausea or vomiting Anesthetic complications: no   No notable events documented.  Last Vitals:  Vitals:   11/21/23 0905 11/21/23 0910  BP: 90/67 92/62  Pulse: 83 83  Resp: 11 16  Temp:    SpO2: 97% 95%    Last Pain:  Vitals:   11/21/23 0855  TempSrc:   PainSc: 0-No pain                 Marqueze Ramcharan P Jadarrius Maselli

## 2023-12-15 ENCOUNTER — Other Ambulatory Visit (HOSPITAL_COMMUNITY): Payer: Self-pay | Admitting: Physician Assistant

## 2023-12-15 ENCOUNTER — Other Ambulatory Visit (HOSPITAL_COMMUNITY): Payer: Self-pay | Admitting: Cardiology

## 2023-12-17 ENCOUNTER — Ambulatory Visit (HOSPITAL_COMMUNITY)
Admission: RE | Admit: 2023-12-17 | Discharge: 2023-12-17 | Disposition: A | Discharge status: Home / Self Care | Source: Ambulatory Visit | Attending: Internal Medicine | Admitting: Internal Medicine

## 2023-12-17 DIAGNOSIS — I5022 Chronic systolic (congestive) heart failure: Secondary | ICD-10-CM

## 2023-12-30 ENCOUNTER — Telehealth (HOSPITAL_COMMUNITY): Payer: Self-pay

## 2023-12-30 NOTE — Progress Notes (Signed)
 Advanced Heart Failure Clinic Note   PCP: [facility] in [facility] HF Cardiologist: Dr. Julane Ny  HPI: Richard [name] is a 48 y.o. obese male with bilateral PEs and systolic HF diagnosed in 2017 with EF 10-15%  Echo 05/2016: EF 10-15%. RV dilated LV thrombus    Echo 11/20/16 EF 25-30% Probable small laminated LV thrombus.    Admitted 06/24/21 w/ a/c systolic CHF, w/ marked fluid overload  Echo EF < 20%, RV moderately reduced, no LV thrombus. R/LHC-> severe NICM, EF 15%, normal cors, mildly elevated filling pressures w/ preserved CO. D/c wt 278 lb.   Echo 09/20/21: LVEF 20% RV moderately reduced  No LV thrombus   Follow up 05/07/23, volume overloaded ReDs 43%. Spiro restarted, continued on torsemide  40 daily.   Echo 11/24 EF < 20% RV ok -> ICD and advanced therapies d/w HF NP. Not interested. Also not interested in sleep study.   Recent visit on 3/31 he was noted to be in Afib w/ CVR of unknown duration. Admitted to several missed doses of Eliquis  in the last month. Also noted to be volume overloaded. ReDs 46%. He had ran out of torsemide  for several days but had restarted. He was given a dose of metolazone  and Coreg  was increased to 9.375 mg bid and instructed to f/u in 2 wks to reassess volume status and to plan increase in torsemide  dose if still fluid overloaded. He has also been scheduled for outpatiet TEE/DCCV, planned for 4/25.   4/16/Last visit diuretics increased. Reds elevated. Coreg  was cut back. He was set up for TEE/Cardioversion.   Today he returns for HF follow up.Overall feeling fine. A little short breath with steps. Denies PND/Orthopnea. Appetite ok. No fever or chills. Weight at home  pounds. Taking all medications. He is not taking jardiance  because pending cardioversion.   FH: Negative for heart disease SH: No alcohol or drug use.   Review of systems complete and found to be negative unless listed in HPI.    SH:  Social History   Socioeconomic History    Marital status: Single    Spouse name: Not on file   Number of children: Not on file   Years of education: Not on file   Highest education level: Not on file  Occupational History   Not on file  Tobacco Use   Smoking status: Never   Smokeless tobacco: Never  Substance and Sexual Activity   Alcohol use: Yes    Comment: rare   Drug use: No   Sexual activity: Not on file  Other Topics Concern   Not on file  Social History Narrative   Not on file   Social Drivers of Health   Financial Resource Strain: Not on file  Food Insecurity: Low Risk  (10/06/2023)   Received from [facility]   Hunger Vital Sign    Worried About Running Out of Food in the Last Year: Never true    Ran Out of Food in the Last Year: Never true  Transportation Needs: No Transportation Needs (10/06/2023)   Received from [facility]    In the past 12 months, has lack of reliable transportation kept you from medical appointments, meetings, work or from getting things needed for daily living? : No  Physical Activity: Not on file  Stress: Not on file  Social Connections: Unknown (12/11/2021)   Received from [facility], [facility]   Social Network    Social Network: Not on file  Intimate Partner Violence: Unknown (11/02/2021)   Received from [facility], [facility]   HITS    Physically Hurt: Not on file    Insult or Talk Down To: Not on file    Threaten Physical Harm: Not on file    Scream or Curse: Not on file   FH:  Family History  Problem Relation Age of Onset   Other Mother        Neg family history of clotting disorder or heart disease   Clotting disorder Neg Hx    Rheumatologic disease Neg Hx    Past Medical History:  Diagnosis Date   CHF (congestive heart failure) (HCC)    Current Outpatient Medications  Medication Sig Dispense Refill   apixaban  (ELIQUIS ) 5 MG TABS tablet Take 1 tablet (5 mg total) by mouth 2 (two) times daily. 180 tablet 3   carvedilol  (COREG )  6.25 MG tablet Take 1 tablet (6.25 mg total) by mouth 2 (two) times daily with a meal. 90 tablet 3   digoxin  (LANOXIN ) 0.125 MG tablet Take 1 tablet by mouth once daily 30 tablet 0   empagliflozin  (JARDIANCE ) 10 MG TABS tablet Take 1 tablet (10 mg total) by mouth daily. 30 tablet 5   mexiletine (MEXITIL) 200 MG capsule Take 1 capsule by mouth twice daily 60 capsule 0   potassium chloride  SA (KLOR-CON  M) 20 MEQ tablet Take 2 tablets (40 mEq total) by mouth daily. (Patient taking differently: Take 40 mEq by mouth 2 (two) times daily.) 180 tablet 2   sacubitril -valsartan  (ENTRESTO ) 49-51 MG Take 0.5 tablets by mouth 2 (two) times daily.     spironolactone  (ALDACTONE ) 25 MG tablet Take 1 tablet (25 mg total) by mouth daily. 45 tablet 0   spironolactone  (ALDACTONE ) 25 MG tablet Take 25 mg by mouth daily.     spironolactone  (ALDACTONE ) 25 MG tablet Take 1/2 (one-half) tablet by mouth once daily 45 tablet 2   torsemide  (DEMADEX ) 20 MG tablet Take 3 tablets (60 mg total) by mouth in the morning AND 2 tablets (40 mg total) every evening. 360 tablet 2   No current facility-administered medications for this visit.   There were no vitals taken for this visit.  Wt Readings from Last 3 Encounters:  11/19/23 (!) 138.9 kg (306 lb 3.2 oz)  11/12/23 (!) 143.4 kg (316 lb 3.2 oz)  10/27/23 (!) 142.2 kg (313 lb 6.4 oz)   PHYSICAL EXAM: General:   No resp difficulty Neck: supple. no JVD.  Cor: PMI nondisplaced. Irregular rate & rhythm. No rubs, gallops or murmurs. Lungs: clear Abdomen: soft, nontender, nondistended.  Extremities: no cyanosis, clubbing, rash, edema Neuro: alert & oriented x3   Wt Readings from Last 3 Encounters:  11/19/23 (!) 138.9 kg (306 lb 3.2 oz)  11/12/23 (!) 143.4 kg (316 lb 3.2 oz)  10/27/23 (!) 142.2 kg (313 lb 6.4 oz)     ASSESSMENT & PLAN: 1.  Chronic Systolic Heart Failure/NICM - Echo 11/17: EF 10-15%. With LV thrombus. 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  - Echo  4/18: EF 25-30% laminated LV thrombus. Cannot exclude non-compaction.  - Echo 8/22: EF < 20%, RV okay, trivial MR - Echo 11/22: EF < 20%, RV moderately reduced  - R/LHC 11/22: severe NICM, EF 15%, normal cors, mildly elevated filling pressures w/ preserved CO and mild pulmonary venous HTN.  - CPX 1/23: Moderate HF impairment, limitations also related to body habitus and deconditioning. - Echo 2/23: EF < 20%, RV moderately reduced -  Zio 3/23: 10% PVC burden - Echo 11/24 EF < 20% RV ok  - NYHA II.  ReDs reading: 38 %, mildly elevated. On exam he looks pretty good. Continue current diuretic regimen.  - Continue Coreg  back to 6.25 mg bid  - Continue torsemide  to 60 qam/40 qpm  - Continue Spiro to 25 mg daily, at bedtime  - Continue Entresto  49/51 mg bid. BP too soft for titration   - Restart jardiance  cardioversion on 11/21/23.  - Continue digoxin  0.125 mg daily. Recent dig level stable.   - QRS narrow, not a candidate for CRT.  - Has refused ICD or w/u for advanced therapies - He has CMRI 12/17/23 .   2. Bilateral PE  - Noted on CTA 11/17.  - Continue Eliquis  5 mg bid. No bleeding issues. - Based on Amplify-EXT data can consider dropping to 2.5 bid but given size, elected not to drop dose.  3. Large LV Thrombus - Noted on Echo on 05/2016.No Thrombus on ECHO 2024.  - On Eliquis . Given severe LV dysfunction and previous PEs would continue   4. NSVT/PVCs - Not interested in ICD - 10% PVC burden on Zio 2/23 - Continue mexiletine 200 mg bid - Has refused sleep study - Can repeat Zio as needed to requantify burden  5. Persistent Atrial fibrillation In SR 05/2023 . EKG 10/27/23 A fib. EKG 11/12/23 A fib.   Last week he was seen and missed some doses of Eliquis  but since that time he has been compliant.  - He has TEE & DC-CV 11/21/23.  We discussed Informed Consent   Shared Decision Making/Informed Consent   The risks [stroke, cardiac arrhythmias rarely resulting in the need for a temporary  or permanent pacemaker, skin irritation or burns, esophageal damage, perforation (1:10,000 risk), bleeding, pharyngeal hematoma as well as other potential complications associated with conscious sedation including aspiration, arrhythmia, respiratory failure and death], benefits (treatment guidance, restoration of normal sinus rhythm, diagnostic support) and alternatives of a transesophageal echocardiogram guided cardioversion were discussed in detail with Mr. Sleight and he is willing to proceed.    - Refused sleep study but has refused - if has ERAF will need to consider switching mexilitene to amio - Refer to EP for possible ablation.    Check BMET and BNP  Follow up in 4-6 weeks.    Elmarie Hacking, FNP  9:14 AM

## 2023-12-30 NOTE — Telephone Encounter (Signed)
 Called to confirm/remind patient of their appointment at the [facility] Clinic on 12/31/23.   Appointment:   [x] Confirmed  [] Left mess   [] No answer/No voice mail  [] VM Full/unable to leave message  [] Phone not in service  Patient reminded to bring all medications and/or complete list.  Confirmed patient has transportation. Gave directions, instructed to utilize valet parking.

## 2023-12-31 ENCOUNTER — Ambulatory Visit (HOSPITAL_COMMUNITY): Payer: Self-pay | Admitting: Family Medicine

## 2023-12-31 ENCOUNTER — Ambulatory Visit (HOSPITAL_COMMUNITY)
Admission: RE | Admit: 2023-12-31 | Discharge: 2023-12-31 | Disposition: A | Discharge status: Home / Self Care | Source: Ambulatory Visit | Attending: Family Medicine | Admitting: Family Medicine

## 2023-12-31 ENCOUNTER — Other Ambulatory Visit (HOSPITAL_COMMUNITY): Payer: Self-pay | Admitting: Family Medicine

## 2023-12-31 VITALS — BP 120/64 | HR 91 | Wt 315.0 lb

## 2023-12-31 DIAGNOSIS — I5022 Chronic systolic (congestive) heart failure: Secondary | ICD-10-CM | POA: Diagnosis not present

## 2023-12-31 DIAGNOSIS — I2699 Other pulmonary embolism without acute cor pulmonale: Secondary | ICD-10-CM

## 2023-12-31 DIAGNOSIS — Z7984 Long term (current) use of oral hypoglycemic drugs: Secondary | ICD-10-CM | POA: Insufficient documentation

## 2023-12-31 DIAGNOSIS — I4819 Other persistent atrial fibrillation: Secondary | ICD-10-CM | POA: Diagnosis not present

## 2023-12-31 DIAGNOSIS — Z79899 Other long term (current) drug therapy: Secondary | ICD-10-CM | POA: Diagnosis not present

## 2023-12-31 DIAGNOSIS — I272 Pulmonary hypertension, unspecified: Secondary | ICD-10-CM | POA: Diagnosis not present

## 2023-12-31 DIAGNOSIS — I513 Intracardiac thrombosis, not elsewhere classified: Secondary | ICD-10-CM

## 2023-12-31 DIAGNOSIS — Z86711 Personal history of pulmonary embolism: Secondary | ICD-10-CM | POA: Diagnosis not present

## 2023-12-31 DIAGNOSIS — Z7901 Long term (current) use of anticoagulants: Secondary | ICD-10-CM | POA: Diagnosis not present

## 2023-12-31 DIAGNOSIS — I4729 Other ventricular tachycardia: Secondary | ICD-10-CM

## 2023-12-31 DIAGNOSIS — I428 Other cardiomyopathies: Secondary | ICD-10-CM | POA: Diagnosis not present

## 2023-12-31 DIAGNOSIS — I480 Paroxysmal atrial fibrillation: Secondary | ICD-10-CM

## 2023-12-31 DIAGNOSIS — I493 Ventricular premature depolarization: Secondary | ICD-10-CM

## 2023-12-31 LAB — BASIC METABOLIC PANEL WITH GFR
Anion gap: 7 (ref 5–15)
BUN: 22 mg/dL — ABNORMAL HIGH (ref 6–20)
CO2: 28 mmol/L (ref 22–32)
Calcium: 9.1 mg/dL (ref 8.9–10.3)
Chloride: 99 mmol/L (ref 98–111)
Creatinine, Ser: 1.61 mg/dL — ABNORMAL HIGH (ref 0.61–1.24)
GFR, Estimated: 53 mL/min — ABNORMAL LOW (ref >60)
Glucose, Bld: 132 mg/dL — ABNORMAL HIGH (ref 70–99)
Potassium: 4.4 mmol/L (ref 3.5–5.1)
Sodium: 134 mmol/L — ABNORMAL LOW (ref 135–145)

## 2023-12-31 LAB — BRAIN NATRIURETIC PEPTIDE: B Natriuretic Peptide: 126.1 pg/mL — ABNORMAL HIGH (ref 0.0–100.0)

## 2023-12-31 MED ORDER — SPIRONOLACTONE 25 MG PO TABS: 25.0000 mg | ORAL_TABLET | Freq: Every day | ORAL | 3 refills | Status: DC

## 2023-12-31 MED ORDER — DIGOXIN 125 MCG PO TABS: 125.0000 ug | ORAL_TABLET | Freq: Every day | ORAL | 11 refills | Status: AC

## 2023-12-31 MED ORDER — MEXILETINE HCL 200 MG PO CAPS: 200.0000 mg | ORAL_CAPSULE | Freq: Two times a day (BID) | ORAL | 11 refills | Status: AC

## 2023-12-31 NOTE — Patient Instructions (Addendum)
 Thank you for coming in today  If you had labs drawn today, any labs that are abnormal the clinic will call you No news is good news  Medications: No changes  Follow up appointments:  Your physician recommends that you schedule a follow-up appointment in:  3 months With Dr. Julane Ny  Please call our office to schedule the follow-up appointment in July for September 2025.   Do the following things EVERYDAY: Weigh yourself in the morning before breakfast. Write it down and keep it in a log. Take your medicines as prescribed Eat low salt foods--Limit salt (sodium) to 2000 mg per day.  Stay as active as you can everyday Limit all fluids for the day to less than 2 liters   At the [facility], you and your health needs are our priority. As part of our continuing mission to provide you with exceptional heart care, we have created designated Provider Care Teams. These Care Teams include your primary Cardiologist (physician) and Advanced Practice Providers (APPs- Physician Assistants and Nurse Practitioners) who all work together to provide you with the care you need, when you need it.   You may see any of the following providers on your designated Care Team at your next follow up: Dr Jules Oar Dr Peder Bourdon Dr. Mimi Alt, NP Ruddy Corral, [facility] [place], [place] Dennise Fitz, NP Luster Salters, PharmD   Please be sure to bring in all your medications bottles to every appointment.    Thank you for choosing [facility]  If you have any questions or concerns before your next appointment please send us  a message through [place] or call our office at [phone].    TO LEAVE A MESSAGE FOR THE NURSE SELECT OPTION 2, PLEASE LEAVE A MESSAGE INCLUDING: YOUR NAME DATE OF BIRTH CALL BACK NUMBER REASON FOR CALL**this is important as we prioritize the call backs  YOU WILL RECEIVE A CALL  BACK THE SAME DAY AS LONG AS YOU CALL BEFORE 4:00 PM

## 2024-01-13 ENCOUNTER — Other Ambulatory Visit (HOSPITAL_COMMUNITY): Payer: Self-pay | Admitting: Physician Assistant

## 2024-01-13 ENCOUNTER — Other Ambulatory Visit (HOSPITAL_COMMUNITY): Payer: Self-pay | Admitting: Internal Medicine

## 2024-01-15 ENCOUNTER — Other Ambulatory Visit (HOSPITAL_COMMUNITY): Payer: Self-pay

## 2024-01-15 DIAGNOSIS — I5022 Chronic systolic (congestive) heart failure: Secondary | ICD-10-CM

## 2024-01-15 MED ORDER — POTASSIUM CHLORIDE CRYS ER 20 MEQ PO TBCR
EXTENDED_RELEASE_TABLET | ORAL | 1 refills | Status: AC
Start: 2024-01-15 — End: ?

## 2024-01-16 ENCOUNTER — Other Ambulatory Visit (HOSPITAL_COMMUNITY): Payer: Self-pay

## 2024-01-20 ENCOUNTER — Other Ambulatory Visit (HOSPITAL_COMMUNITY): Payer: Self-pay

## 2024-01-20 ENCOUNTER — Telehealth (HOSPITAL_COMMUNITY): Payer: Self-pay

## 2024-01-20 NOTE — Telephone Encounter (Signed)
 Advanced Heart Failure Patient Advocate Encounter  Prior authorization for Jardiance  has been submitted and approved. Test billing returns $4 for 90 day supply.  Key: [id number] Effective: 01/19/2024 to 01/18/2025  Rachel DEL, CPhT Rx Patient Advocate Phone: [phone]

## 2024-01-28 ENCOUNTER — Other Ambulatory Visit: Payer: Self-pay | Admitting: Internal Medicine

## 2024-02-13 ENCOUNTER — Encounter (HOSPITAL_COMMUNITY): Payer: Self-pay | Admitting: Internal Medicine

## 2024-02-16 ENCOUNTER — Ambulatory Visit (HOSPITAL_COMMUNITY): Payer: PRIVATE HEALTH INSURANCE

## 2024-02-22 ENCOUNTER — Other Ambulatory Visit (HOSPITAL_COMMUNITY): Payer: Self-pay | Admitting: Physician Assistant

## 2024-03-31 ENCOUNTER — Ambulatory Visit (HOSPITAL_COMMUNITY): Admission: RE | Admit: 2024-03-31 | Payer: PRIVATE HEALTH INSURANCE | Source: Ambulatory Visit

## 2024-04-11 ENCOUNTER — Other Ambulatory Visit (HOSPITAL_COMMUNITY): Payer: Self-pay | Admitting: Physician Assistant

## 2024-06-20 ENCOUNTER — Other Ambulatory Visit (HOSPITAL_COMMUNITY): Payer: Self-pay | Admitting: Physician Assistant

## 2024-07-23 ENCOUNTER — Other Ambulatory Visit (HOSPITAL_COMMUNITY): Payer: Self-pay | Admitting: Physician Assistant

## 2024-07-27 ENCOUNTER — Other Ambulatory Visit (HOSPITAL_COMMUNITY): Payer: Self-pay | Admitting: Family Medicine

## 2024-07-27 ENCOUNTER — Other Ambulatory Visit (HOSPITAL_COMMUNITY): Payer: Self-pay

## 2024-07-27 MED ORDER — SACUBITRIL-VALSARTAN 49-51 MG PO TABS: ORAL_TABLET | ORAL | 0 refills | Status: AC

## 2024-08-26 ENCOUNTER — Other Ambulatory Visit (HOSPITAL_COMMUNITY): Payer: Self-pay | Admitting: Family Medicine
